# Patient Record
Sex: Male | Born: 1975 | Race: Black or African American | Hispanic: No | Marital: Married | Smoking: Current every day smoker
Health system: Southern US, Community
[De-identification: ages and names within clinical notes are randomized; demographics above are authoritative.]

## PROBLEM LIST (undated history)

## (undated) DIAGNOSIS — I10 Essential (primary) hypertension: Secondary | ICD-10-CM

## (undated) DIAGNOSIS — K635 Polyp of colon: Secondary | ICD-10-CM

## (undated) DIAGNOSIS — K2970 Gastritis, unspecified, without bleeding: Secondary | ICD-10-CM

---

## 2018-11-06 ENCOUNTER — Emergency Department (HOSPITAL_COMMUNITY)
Admission: EM | Admit: 2018-11-06 | Discharge: 2018-11-06 | Disposition: A | Discharge status: Home / Self Care | Payer: Self-pay | Attending: Emergency Medicine | Admitting: Emergency Medicine

## 2018-11-06 ENCOUNTER — Encounter (HOSPITAL_COMMUNITY): Payer: Self-pay | Admitting: *Deleted

## 2018-11-06 DIAGNOSIS — R112 Nausea with vomiting, unspecified: Secondary | ICD-10-CM | POA: Insufficient documentation

## 2018-11-06 DIAGNOSIS — H5712 Ocular pain, left eye: Secondary | ICD-10-CM | POA: Insufficient documentation

## 2018-11-06 DIAGNOSIS — I10 Essential (primary) hypertension: Secondary | ICD-10-CM | POA: Insufficient documentation

## 2018-11-06 DIAGNOSIS — F17210 Nicotine dependence, cigarettes, uncomplicated: Secondary | ICD-10-CM | POA: Insufficient documentation

## 2018-11-06 DIAGNOSIS — R197 Diarrhea, unspecified: Secondary | ICD-10-CM | POA: Insufficient documentation

## 2018-11-06 HISTORY — DX: Essential (primary) hypertension: I10

## 2018-11-06 MED ORDER — TETRACAINE HCL 0.5 % OP SOLN
2.0000 [drp] | Freq: Once | OPHTHALMIC | Status: DC
  Filled 2018-11-06: qty 4

## 2018-11-06 MED ORDER — ONDANSETRON HCL 4 MG PO TABS: 4.0000 mg | ORAL_TABLET | Freq: Three times a day (TID) | ORAL | 0 refills | Status: DC | PRN

## 2018-11-06 NOTE — ED Triage Notes (Signed)
Pt in c/o left eye redness and drainage also n/v yesterday, no distress noted

## 2018-11-06 NOTE — Discharge Instructions (Addendum)
You were evaluated in the emergency department for pain in your left eye with some light sensitivity that is been longstanding.  For that we recommend that you follow-up with 1 of her eye specialist, his number and address are given.  You also were having an episode of nausea and vomiting which seems to be improving.  We are providing you a prescription for some Zofran.  Please get set up with a primary care doctor but return if any worsening symptoms.
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
## 2020-05-24 ENCOUNTER — Emergency Department (HOSPITAL_COMMUNITY)
Admission: EM | Admit: 2020-05-24 | Discharge: 2020-05-24 | Disposition: A | Discharge status: Home / Self Care | Payer: Medicaid Other | Attending: Emergency Medicine | Admitting: Emergency Medicine

## 2020-05-24 ENCOUNTER — Encounter (HOSPITAL_COMMUNITY): Payer: Self-pay | Admitting: Emergency Medicine

## 2020-05-24 ENCOUNTER — Other Ambulatory Visit: Payer: Self-pay

## 2020-05-24 DIAGNOSIS — R112 Nausea with vomiting, unspecified: Secondary | ICD-10-CM | POA: Insufficient documentation

## 2020-05-24 DIAGNOSIS — Z5321 Procedure and treatment not carried out due to patient leaving prior to being seen by health care provider: Secondary | ICD-10-CM | POA: Insufficient documentation

## 2020-05-24 LAB — CBC
HCT: 46.2 % (ref 39.0–52.0)
Hemoglobin: 15.5 g/dL (ref 13.0–17.0)
MCH: 31.5 pg (ref 26.0–34.0)
MCHC: 33.5 g/dL (ref 30.0–36.0)
MCV: 93.9 fL (ref 80.0–100.0)
Platelets: 406 10*3/uL — ABNORMAL HIGH (ref 150–400)
RBC: 4.92 MIL/uL (ref 4.22–5.81)
RDW: 13.3 % (ref 11.5–15.5)
WBC: 11.1 10*3/uL — ABNORMAL HIGH (ref 4.0–10.5)
nRBC: 0 % (ref 0.0–0.2)

## 2020-05-24 LAB — COMPREHENSIVE METABOLIC PANEL
ALT: 15 U/L (ref 0–44)
AST: 16 U/L (ref 15–41)
Albumin: 4.5 g/dL (ref 3.5–5.0)
Alkaline Phosphatase: 55 U/L (ref 38–126)
Anion gap: 13 (ref 5–15)
BUN: 14 mg/dL (ref 6–20)
CO2: 29 mmol/L (ref 22–32)
Calcium: 9.4 mg/dL (ref 8.9–10.3)
Chloride: 98 mmol/L (ref 98–111)
Creatinine, Ser: 1.28 mg/dL — ABNORMAL HIGH (ref 0.61–1.24)
GFR calc Af Amer: >60 mL/min (ref >60)
GFR calc non Af Amer: >60 mL/min (ref >60)
Glucose, Bld: 130 mg/dL — ABNORMAL HIGH (ref 70–99)
Potassium: 3.3 mmol/L — ABNORMAL LOW (ref 3.5–5.1)
Sodium: 140 mmol/L (ref 135–145)
Total Bilirubin: 1.8 mg/dL — ABNORMAL HIGH (ref 0.3–1.2)
Total Protein: 7.9 g/dL (ref 6.5–8.1)

## 2020-05-24 LAB — LIPASE, BLOOD: Lipase: 34 U/L (ref 11–51)

## 2020-05-24 MED ORDER — SODIUM CHLORIDE 0.9% FLUSH: 3.0000 mL | Freq: Once | INTRAVENOUS | Status: DC

## 2020-05-24 NOTE — ED Notes (Addendum)
Pt stated he can not wait any longer and is leaving

## 2020-05-24 NOTE — ED Triage Notes (Signed)
Pt stated, Ive had N/V since yesterday ibuprofen hae drank some Pedialyte which some has stayed down.

## 2020-08-11 ENCOUNTER — Emergency Department (HOSPITAL_COMMUNITY): Payer: Self-pay

## 2020-08-11 ENCOUNTER — Emergency Department (HOSPITAL_COMMUNITY)
Admission: EM | Admit: 2020-08-11 | Discharge: 2020-08-11 | Disposition: A | Discharge status: Home / Self Care | Payer: Self-pay | Attending: Emergency Medicine | Admitting: Emergency Medicine

## 2020-08-11 ENCOUNTER — Encounter (HOSPITAL_COMMUNITY): Payer: Self-pay

## 2020-08-11 DIAGNOSIS — K2900 Acute gastritis without bleeding: Secondary | ICD-10-CM | POA: Insufficient documentation

## 2020-08-11 DIAGNOSIS — F17210 Nicotine dependence, cigarettes, uncomplicated: Secondary | ICD-10-CM | POA: Insufficient documentation

## 2020-08-11 DIAGNOSIS — I10 Essential (primary) hypertension: Secondary | ICD-10-CM | POA: Insufficient documentation

## 2020-08-11 DIAGNOSIS — Z20822 Contact with and (suspected) exposure to covid-19: Secondary | ICD-10-CM | POA: Insufficient documentation

## 2020-08-11 LAB — COMPREHENSIVE METABOLIC PANEL
ALT: 36 U/L (ref 0–44)
AST: 32 U/L (ref 15–41)
Albumin: 4.2 g/dL (ref 3.5–5.0)
Alkaline Phosphatase: 55 U/L (ref 38–126)
Anion gap: 14 (ref 5–15)
BUN: 19 mg/dL (ref 6–20)
CO2: 28 mmol/L (ref 22–32)
Calcium: 9 mg/dL (ref 8.9–10.3)
Chloride: 94 mmol/L — ABNORMAL LOW (ref 98–111)
Creatinine, Ser: 1.23 mg/dL (ref 0.61–1.24)
GFR calc non Af Amer: >60 mL/min (ref >60)
Glucose, Bld: 114 mg/dL — ABNORMAL HIGH (ref 70–99)
Potassium: 3.1 mmol/L — ABNORMAL LOW (ref 3.5–5.1)
Sodium: 136 mmol/L (ref 135–145)
Total Bilirubin: 1.3 mg/dL — ABNORMAL HIGH (ref 0.3–1.2)
Total Protein: 7.5 g/dL (ref 6.5–8.1)

## 2020-08-11 LAB — CBC
HCT: 49.3 % (ref 39.0–52.0)
Hemoglobin: 16.7 g/dL (ref 13.0–17.0)
MCH: 32.1 pg (ref 26.0–34.0)
MCHC: 33.9 g/dL (ref 30.0–36.0)
MCV: 94.8 fL (ref 80.0–100.0)
Platelets: 419 10*3/uL — ABNORMAL HIGH (ref 150–400)
RBC: 5.2 MIL/uL (ref 4.22–5.81)
RDW: 12.6 % (ref 11.5–15.5)
WBC: 11.9 10*3/uL — ABNORMAL HIGH (ref 4.0–10.5)
nRBC: 0 % (ref 0.0–0.2)

## 2020-08-11 LAB — URINALYSIS, ROUTINE W REFLEX MICROSCOPIC
Bacteria, UA: NONE SEEN
Bilirubin Urine: NEGATIVE
Glucose, UA: NEGATIVE mg/dL
Ketones, ur: NEGATIVE mg/dL
Nitrite: NEGATIVE
Protein, ur: NEGATIVE mg/dL
Specific Gravity, Urine: 1.019 (ref 1.005–1.030)
pH: 5 (ref 5.0–8.0)

## 2020-08-11 LAB — RESPIRATORY PANEL BY RT PCR (FLU A&B, COVID)
Influenza A by PCR: NEGATIVE
Influenza B by PCR: NEGATIVE
SARS Coronavirus 2 by RT PCR: NEGATIVE

## 2020-08-11 LAB — LIPASE, BLOOD: Lipase: 30 U/L (ref 11–51)

## 2020-08-11 MED ORDER — SODIUM CHLORIDE 0.9 % IV SOLN
80.0000 mg | Freq: Once | INTRAVENOUS | Status: AC
  Administered 2020-08-11: 80 mg via INTRAVENOUS
  Filled 2020-08-11: qty 80

## 2020-08-11 MED ORDER — ESOMEPRAZOLE MAGNESIUM 40 MG PO CPDR: 40.0000 mg | DELAYED_RELEASE_CAPSULE | Freq: Every day | ORAL | 0 refills | Status: DC

## 2020-08-11 MED ORDER — LIDOCAINE VISCOUS HCL 2 % MT SOLN
15.0000 mL | Freq: Once | OROMUCOSAL | Status: AC
  Administered 2020-08-11: 15 mL via ORAL
  Filled 2020-08-11: qty 15

## 2020-08-11 MED ORDER — ALUM & MAG HYDROXIDE-SIMETH 200-200-20 MG/5ML PO SUSP
30.0000 mL | Freq: Once | ORAL | Status: AC
  Administered 2020-08-11: 30 mL via ORAL
  Filled 2020-08-11: qty 30

## 2020-08-11 MED ORDER — POTASSIUM CHLORIDE CRYS ER 20 MEQ PO TBCR
40.0000 meq | EXTENDED_RELEASE_TABLET | Freq: Once | ORAL | Status: AC
  Administered 2020-08-11: 40 meq via ORAL
  Filled 2020-08-11: qty 2

## 2020-08-11 MED ORDER — LACTATED RINGERS IV BOLUS: 1000.0000 mL | Freq: Once | INTRAVENOUS | Status: DC

## 2020-08-11 MED ORDER — SODIUM CHLORIDE 0.9 % IV BOLUS
1000.0000 mL | Freq: Once | INTRAVENOUS | Status: AC
  Administered 2020-08-11: 1000 mL via INTRAVENOUS

## 2020-08-11 NOTE — Discharge Instructions (Signed)
This is likely another flare of your gastritis.  Please start taking Nexium once daily.  This will help your stomach.  Call the number included in your paperwork to schedule an appointment with a primary care doctor.  They will be able to determine next steps.

## 2020-08-11 NOTE — ED Triage Notes (Signed)
Pt reports nv/d and left sided flank pain for the past week. Denies blood in urine or stool

## 2020-08-11 NOTE — ED Notes (Signed)
Pt st's he has had gastritis in past and symptoms feel the same

## 2020-08-11 NOTE — ED Provider Notes (Signed)
[facility] EMERGENCY DEPARTMENT Provider Note   CSN: [id number] Arrival date & time: 08/11/20  1455     History Chief Complaint  Patient presents with  . Emesis  . Diarrhea  . Flank Pain    Victor Johnson is a 44 y.o. male with history of gastritis who presents with a week and a half of nausea and vomiting.  Endorses continued fatigue and lightheadedness upon standing, as well as some muscle cramps in his lower legs. Kept some soup down this morning, but previously not tolerating p.o. well for the majority of the last week and a half.  States his symptoms feel very similar to prior bouts of gastritis, which can be triggered by eating highly acidic foods. He does drink multiple alcoholic beverages a day, but he denies having increased his drinking recently.  He used to be on probiotics and another medication for his gastritis, but has not been taking anything for a while.   Emesis Severity:  Moderate Duration:  10 days Timing:  Intermittent Quality:  Stomach contents Progression:  Improving Chronicity:  Recurrent Recent urination:  Decreased Relieved by:  Nothing Associated symptoms: diarrhea and myalgias   Associated symptoms: no abdominal pain, no arthralgias, no chills, no cough, no fever, no headaches and no sore throat        Past Medical History:  Diagnosis Date  . Hypertension     There are no problems to display for this patient.   History reviewed. No pertinent surgical history.     No family history on file.  Social History   Tobacco Use  . Smoking status: Current Every Day Smoker  . Smokeless tobacco: Never Used  Substance Use Topics  . Alcohol use: Yes  . Drug use: Yes    Types: Marijuana    Home Medications Prior to Admission medications   Medication Sig Start Date End Date Taking? Authorizing Provider  esomeprazole (NEXIUM) 40 MG capsule Take 1 capsule (40 mg total) by mouth daily. 08/11/20   Allayne Butcher, MD  ondansetron  (ZOFRAN) 4 MG tablet Take 1 tablet (4 mg total) by mouth every 8 (eight) hours as needed for nausea or vomiting. Patient not taking: Reported on 08/11/2020 11/06/18   Terrilee Files, MD    Allergies    Patient has no known allergies.  Review of Systems   Review of Systems  Constitutional: Negative for chills and fever.  HENT: Negative for ear pain and sore throat.   Eyes: Negative for pain and visual disturbance.  Respiratory: Negative for cough and shortness of breath.   Cardiovascular: Negative for chest pain and palpitations.  Gastrointestinal: Positive for diarrhea, nausea and vomiting. Negative for abdominal pain.  Genitourinary: Positive for decreased urine volume. Negative for dysuria and hematuria.  Musculoskeletal: Positive for myalgias. Negative for arthralgias and back pain.  Skin: Negative for color change and rash.  Neurological: Negative for seizures, syncope and headaches.  All other systems reviewed and are negative.   Physical Exam Updated Vital Signs BP (!) 133/100 (BP Location: Right Arm)   Pulse 88   Temp 98.2 F (36.8 C) (Oral)   Resp 18   Ht 5\' 8"  (1.727 m)   Wt 63.5 kg   SpO2 100%   BMI 21.29 kg/m   Physical Exam Vitals and nursing note reviewed.  Constitutional:      Appearance: He is well-developed.  HENT:     Head: Normocephalic and atraumatic.     Mouth/Throat:  Mouth: Mucous membranes are dry.  Eyes:     Conjunctiva/sclera: Conjunctivae normal.  Cardiovascular:     Rate and Rhythm: Normal rate and regular rhythm.     Heart sounds: No murmur heard.   Pulmonary:     Effort: Pulmonary effort is normal. No respiratory distress.     Breath sounds: Normal breath sounds.  Abdominal:     Palpations: Abdomen is soft.     Tenderness: There is abdominal tenderness (mild left flank tenderness).  Musculoskeletal:     Cervical back: Neck supple.  Skin:    General: Skin is warm and dry.  Neurological:     Mental Status: He is alert.      ED Results / Procedures / Treatments   Labs (all labs ordered are listed, but only abnormal results are displayed) Labs Reviewed  COMPREHENSIVE METABOLIC PANEL - Abnormal; Notable for the following components:      Result Value   Potassium 3.1 (*)    Chloride 94 (*)    Glucose, Bld 114 (*)    Total Bilirubin 1.3 (*)    All other components within normal limits  CBC - Abnormal; Notable for the following components:   WBC 11.9 (*)    Platelets 419 (*)    All other components within normal limits  URINALYSIS, ROUTINE W REFLEX MICROSCOPIC - Abnormal; Notable for the following components:   APPearance HAZY (*)    Hgb urine dipstick SMALL (*)    Leukocytes,Ua MODERATE (*)    All other components within normal limits  RESPIRATORY PANEL BY RT PCR (FLU A&B, COVID)  LIPASE, BLOOD    EKG None  Radiology DG Chest Port 1 View  Result Date: 08/11/2020 CLINICAL DATA:  Vomiting EXAM: PORTABLE CHEST 1 VIEW COMPARISON:  None. FINDINGS: The heart size and mediastinal contours are within normal limits. Both lungs are clear. The visualized skeletal structures are unremarkable. IMPRESSION: No active disease. Electronically Signed   By: Helyn Numbers MD   On: 08/11/2020 16:18    Procedures Procedures (including critical care time)  Medications Ordered in ED Medications  sodium chloride 0.9 % bolus 1,000 mL (0 mLs Intravenous Stopped 08/11/20 1742)  pantoprazole (PROTONIX) 80 mg in sodium chloride 0.9 % 100 mL IVPB (0 mg Intravenous Stopped 08/11/20 1658)  alum & mag hydroxide-simeth (MAALOX/MYLANTA) 200-200-20 MG/5ML suspension 30 mL (30 mLs Oral Given 08/11/20 1636)    And  lidocaine (XYLOCAINE) 2 % viscous mouth solution 15 mL (15 mLs Oral Given 08/11/20 1636)  potassium chloride SA (KLOR-CON) CR tablet 40 mEq (40 mEq Oral Given 08/11/20 1635)    ED Course  I have reviewed the triage vital signs and the nursing notes.  Pertinent labs & imaging results that were available during my  care of the patient were reviewed by me and considered in my medical decision making (see chart for details).    MDM Rules/Calculators/A&P                          WBC 11.9. Potassium 3.1, will replete. Covid negative. Labs otherwise unremarkable. Not consistent with pancreatitis, appendicitis, UTI.   Consistent with gastritis versus viral gastro. Patient given fluid bolus, GI cocktail, Protonix with improvement in symptoms. He does not have insurance that he has not been taking medications or seeing a PCP. Patient given resources for follow-up in addition to a new prescription for nexium. He is stable for discharge at this time.  This patient was seen  with Dr. Silverio Lay. Final Clinical Impression(s) / ED Diagnoses Final diagnoses:  Acute gastritis without hemorrhage, unspecified gastritis type    Rx / DC Orders ED Discharge Orders         Ordered    esomeprazole (NEXIUM) 40 MG capsule  Daily        08/11/20 1744           Allayne Butcher, MD 08/11/20 2314    Charlynne Pander, MD 08/12/20 [phone]

## 2020-08-11 NOTE — ED Notes (Signed)
Case worker Burna Mortimer) in to speak with pt about primary care    Information given to pt

## 2020-08-23 ENCOUNTER — Other Ambulatory Visit: Payer: Self-pay

## 2020-08-23 ENCOUNTER — Encounter (INDEPENDENT_AMBULATORY_CARE_PROVIDER_SITE_OTHER): Payer: Self-pay | Admitting: Primary Care

## 2020-08-23 ENCOUNTER — Ambulatory Visit (INDEPENDENT_AMBULATORY_CARE_PROVIDER_SITE_OTHER): Payer: Self-pay | Admitting: Primary Care

## 2020-08-23 VITALS — BP 143/89 | HR 79 | Temp 97.5°F | Ht 68.0 in | Wt 146.0 lb

## 2020-08-23 DIAGNOSIS — Z7689 Persons encountering health services in other specified circumstances: Secondary | ICD-10-CM

## 2020-08-23 DIAGNOSIS — I10 Essential (primary) hypertension: Secondary | ICD-10-CM

## 2020-08-23 DIAGNOSIS — E876 Hypokalemia: Secondary | ICD-10-CM

## 2020-08-23 MED ORDER — AMLODIPINE BESYLATE 5 MG PO TABS: 5.0000 mg | ORAL_TABLET | Freq: Every day | ORAL | 1 refills | Status: DC

## 2020-08-23 NOTE — Patient Instructions (Signed)

## 2020-08-23 NOTE — Progress Notes (Signed)
°  HPI [name] is a  44 y.o.male presents for follow up from the emergency room presented on  08/11/20, with nausea vomiting and diarrhea.  Symptoms lasted a week or so. He is also, establishing care.  Past Medical History:  Diagnosis Date   Hypertension      No Known Allergies    Current Outpatient Medications on File Prior to Visit  Medication Sig Dispense Refill   esomeprazole (NEXIUM) 40 MG capsule Take 1 capsule (40 mg total) by mouth daily. 30 capsule 0   No current facility-administered medications on file prior to visit.    ROS: all negative except above.   Physical Exam: Filed Weights   08/23/20 1552  Weight: 146 lb (66.2 kg)   BP (!) 151/108 (BP Location: Right Arm, Patient Position: Sitting, Cuff Size: Normal)    Pulse 91    Temp (!) 97.5 F (36.4 C) (Temporal)    Ht 5\' 8"  (1.727 m)    Wt 146 lb (66.2 kg)    SpO2 97%    BMI 22.20 kg/m  General Appearance: Well nourished,male who is in no apparent distress. Eyes: PERRLA, EOMs, conjunctiva no swelling or erythema Sinuses: No Frontal/maxillary tenderness ENT/Mouth: Ext aud canals clear, TMs without erythema, bulging. Hearing normal.  Neck: Supple, thyroid normal.  Respiratory: Respiratory effort normal, BS equal bilaterally without rales, rhonchi, wheezing or stridor.  Cardio: RRR with no MRGs. Brisk peripheral pulses without edema.  Abdomen: Soft, + BS.  Non tender, no guarding, rebound, hernias, masses. Lymphatics: Non tender without lymphadenopathy.  Musculoskeletal: Full ROM, 5/5 strength, normal gait.  Skin: Warm, dry without rashes, lesions, ecchymosis.  Neuro: Cranial nerves intact. Normal muscle tone, no cerebellar symptoms. Sensation intact.  Psych: Awake and oriented X 3, normal affect, Insight and Judgment appropriate.    Victor Johnson was seen today for hospitalization follow-up.  Diagnoses and all orders for this visit:  Encounter to establish care Aneta Mins, NP-C will be your  (PCP) she  is mastered prepared . Able to diagnosed and treatment also  answer health concern as well as continuing care of varied medical conditions, not limited by cause, organ system, or diagnosis.   Hypokalemia Replaced in ED will follow up for Bp ck and potassium   Essential hypertension Counseled on blood pressure goal of less than 130/80, low-sodium, DASH diet, medication compliance, 150 minutes of moderate intensity exercise per week. . -     amLODipine (NORVASC) 5 MG tablet; Take 1 tablet (5 mg total) by mouth daily.  [name]

## 2020-10-04 ENCOUNTER — Ambulatory Visit (INDEPENDENT_AMBULATORY_CARE_PROVIDER_SITE_OTHER): Payer: Medicaid Other | Admitting: Primary Care

## 2020-10-13 ENCOUNTER — Ambulatory Visit (INDEPENDENT_AMBULATORY_CARE_PROVIDER_SITE_OTHER): Payer: BC Managed Care – PPO | Admitting: Primary Care

## 2020-10-13 ENCOUNTER — Encounter (INDEPENDENT_AMBULATORY_CARE_PROVIDER_SITE_OTHER): Payer: Self-pay | Admitting: Primary Care

## 2020-10-13 ENCOUNTER — Other Ambulatory Visit: Payer: Self-pay

## 2020-10-13 VITALS — BP 158/115 | HR 98 | Temp 98.1°F | Ht 68.0 in | Wt 142.6 lb

## 2020-10-13 DIAGNOSIS — Z0130 Encounter for examination of blood pressure without abnormal findings: Secondary | ICD-10-CM | POA: Diagnosis not present

## 2020-10-13 MED ORDER — HYDROCHLOROTHIAZIDE 25 MG PO TABS: 25.0000 mg | ORAL_TABLET | Freq: Every day | ORAL | 3 refills | Status: DC

## 2020-10-13 MED ORDER — AMLODIPINE BESYLATE 10 MG PO TABS: 10.0000 mg | ORAL_TABLET | Freq: Every day | ORAL | 3 refills | Status: DC

## 2020-10-13 NOTE — Progress Notes (Signed)
[facility]    [name] is  a 44 year male who presents for hypertension evaluation, on amlodipine 5 mg previous visit medication was not  adjusted . Denies shortness of breath, headaches, chest pain or lower extremity edema. Prior to appointment has a disagreement causing increase stress. Patient reports adherence with medications.  Current Medication List Current Outpatient Medications on File Prior to Visit  Medication Sig Dispense Refill   amLODipine (NORVASC) 5 MG tablet Take 1 tablet (5 mg total) by mouth daily. 30 tablet 1   esomeprazole (NEXIUM) 40 MG capsule Take 1 capsule (40 mg total) by mouth daily. (Patient not taking: Reported on 10/13/2020) 30 capsule 0   No current facility-administered medications on file prior to visit.   Past Medical History  Past Medical History:  Diagnosis Date   Hypertension    Dietary habits include: discussed eats everything and likes food well seasoned Stop salt, use Ms Sharilyn Sites, onion/garlic powder and herbs Exercise habits include no Family / Social history: Unknown  ASCVD risk factors include- [name]  O:  Physical Exam Vitals reviewed.  Constitutional:      Appearance: He is normal weight.  HENT:     Head: Normocephalic.     Nose: Nose normal.  Cardiovascular:     Rate and Rhythm: Normal rate and regular rhythm.  Pulmonary:     Effort: Pulmonary effort is normal.     Breath sounds: Normal breath sounds.  Abdominal:     General: Bowel sounds are normal.     Palpations: Abdomen is soft.  Musculoskeletal:        General: Normal range of motion.     Cervical back: Normal range of motion.  Skin:    General: Skin is warm and dry.  Neurological:     Mental Status: He is alert and oriented to person, place, and time.  Psychiatric:        Mood and Affect: Mood normal.        Behavior: Behavior normal.        Thought Content: Thought content normal.      Review of Systems  All other systems reviewed and  are negative.   Last 3 Office BP readings: BP Readings from Last 3 Encounters:  10/13/20 (!) 158/115  08/23/20 (!) 143/89  08/11/20 (!) 133/100    BMET    Component Value Date/Time   NA 136 08/11/2020 1524   K 3.1 (L) 08/11/2020 1524   CL 94 (L) 08/11/2020 1524   CO2 28 08/11/2020 1524   GLUCOSE 114 (H) 08/11/2020 1524   BUN 19 08/11/2020 1524   CREATININE 1.23 08/11/2020 1524   CALCIUM 9.0 08/11/2020 1524   GFRNONAA >60 08/11/2020 1524   GFRAA >60 05/24/2020 1029    Renal function: CrCl cannot be calculated (Patient's most recent lab result is older than the maximum 21 days allowed.).  Clinical ASCVD: No  The ASCVD Risk score Denman George DC Jr., et al., 2013) failed to calculate for the following reasons:   Cannot find a previous HDL lab   Cannot find a previous total cholesterol lab   A/P: Victor Johnson was seen today for blood pressure check.  Diagnoses and all orders for this visit:  Blood pressure check Hypertension longstanding currently amlodipine 10mg  daily on current medications. BP Goal = 130/80  mmHg. Patient is adherent with current medications.  -Adjusted dose of  Increased Amlodipine from 5mg  to 10mg   and  Added HCTZ 25mg  daily. -Counseled on lifestyle modifications  for blood pressure control including reduced dietary sodium, increased exercise, adequate sleep  Grayce Sessions

## 2020-10-13 NOTE — Patient Instructions (Signed)
DASH Eating Plan DASH stands for "Dietary Approaches to Stop Hypertension." The DASH eating plan is a healthy eating plan that has been shown to reduce high blood pressure (hypertension). It may also reduce your risk for type 2 diabetes, heart disease, and stroke. The DASH eating plan may also help with weight loss. What are tips for following this plan?  General guidelines  Avoid eating more than 2,300 mg (milligrams) of salt (sodium) a day. If you have hypertension, you may need to reduce your sodium intake to 1,500 mg a day.  Limit alcohol intake to no more than 1 drink a day for nonpregnant women and 2 drinks a day for men. One drink equals 12 oz of beer, 5 oz of wine, or 1 oz of hard liquor.  Work with your health care provider to maintain a healthy body weight or to lose weight. Ask what an ideal weight is for you.  Get at least 30 minutes of exercise that causes your heart to beat faster (aerobic exercise) most days of the week. Activities may include walking, swimming, or biking.  Work with your health care provider or diet and nutrition specialist (dietitian) to adjust your eating plan to your individual calorie needs. Reading food labels   Check food labels for the amount of sodium per serving. Choose foods with less than 5 percent of the Daily Value of sodium. Generally, foods with less than 300 mg of sodium per serving fit into this eating plan.  To find whole grains, look for the word "whole" as the first word in the ingredient list. Shopping  Buy products labeled as "low-sodium" or "no salt added."  Buy fresh foods. Avoid canned foods and premade or frozen meals. Cooking  Avoid adding salt when cooking. Use salt-free seasonings or herbs instead of table salt or sea salt. Check with your health care provider or pharmacist before using salt substitutes.  Do not fry foods. Cook foods using healthy methods such as baking, boiling, grilling, and broiling instead.  Cook with  heart-healthy oils, such as olive, canola, soybean, or sunflower oil. Meal planning  Eat a balanced diet that includes: ? 5 or more servings of fruits and vegetables each day. At each meal, try to fill half of your plate with fruits and vegetables. ? Up to 6-8 servings of whole grains each day. ? Less than 6 oz of lean meat, poultry, or fish each day. A 3-oz serving of meat is about the same size as a deck of cards. One egg equals 1 oz. ? 2 servings of low-fat dairy each day. ? A serving of nuts, seeds, or beans 5 times each week. ? Heart-healthy fats. Healthy fats called Omega-3 fatty acids are found in foods such as flaxseeds and coldwater fish, like sardines, salmon, and mackerel.  Limit how much you eat of the following: ? Canned or prepackaged foods. ? Food that is high in trans fat, such as fried foods. ? Food that is high in saturated fat, such as fatty meat. ? Sweets, desserts, sugary drinks, and other foods with added sugar. ? Full-fat dairy products.  Do not salt foods before eating.  Try to eat at least 2 vegetarian meals each week.  Eat more home-cooked food and less restaurant, buffet, and fast food.  When eating at a restaurant, ask that your food be prepared with less salt or no salt, if possible. What foods are recommended? The items listed may not be a complete list. Talk with your dietitian about   what dietary choices are best for you. Grains Whole-grain or whole-wheat bread. Whole-grain or whole-wheat pasta. Brown rice. Oatmeal. Quinoa. Bulgur. Whole-grain and low-sodium cereals. Pita bread. Low-fat, low-sodium crackers. Whole-wheat flour tortillas. Vegetables Fresh or frozen vegetables (raw, steamed, roasted, or grilled). Low-sodium or reduced-sodium tomato and vegetable juice. Low-sodium or reduced-sodium tomato sauce and tomato paste. Low-sodium or reduced-sodium canned vegetables. Fruits All fresh, dried, or frozen fruit. Canned fruit in natural juice (without  added sugar). Meat and other protein foods Skinless chicken or turkey. Ground chicken or turkey. Pork with fat trimmed off. Fish and seafood. Egg whites. Dried beans, peas, or lentils. Unsalted nuts, nut butters, and seeds. Unsalted canned beans. Lean cuts of beef with fat trimmed off. Low-sodium, lean deli meat. Dairy Low-fat (1%) or fat-free (skim) milk. Fat-free, low-fat, or reduced-fat cheeses. Nonfat, low-sodium ricotta or cottage cheese. Low-fat or nonfat yogurt. Low-fat, low-sodium cheese. Fats and oils Soft margarine without trans fats. Vegetable oil. Low-fat, reduced-fat, or light mayonnaise and salad dressings (reduced-sodium). Canola, safflower, olive, soybean, and sunflower oils. Avocado. Seasoning and other foods Herbs. Spices. Seasoning mixes without salt. Unsalted popcorn and pretzels. Fat-free sweets. What foods are not recommended? The items listed may not be a complete list. Talk with your dietitian about what dietary choices are best for you. Grains Baked goods made with fat, such as croissants, muffins, or some breads. Dry pasta or rice meal packs. Vegetables Creamed or fried vegetables. Vegetables in a cheese sauce. Regular canned vegetables (not low-sodium or reduced-sodium). Regular canned tomato sauce and paste (not low-sodium or reduced-sodium). Regular tomato and vegetable juice (not low-sodium or reduced-sodium). Pickles. Olives. Fruits Canned fruit in a light or heavy syrup. Fried fruit. Fruit in cream or butter sauce. Meat and other protein foods Fatty cuts of meat. Ribs. Fried meat. Bacon. Sausage. Bologna and other processed lunch meats. Salami. Fatback. Hotdogs. Bratwurst. Salted nuts and seeds. Canned beans with added salt. Canned or smoked fish. Whole eggs or egg yolks. Chicken or turkey with skin. Dairy Whole or 2% milk, cream, and half-and-half. Whole or full-fat cream cheese. Whole-fat or sweetened yogurt. Full-fat cheese. Nondairy creamers. Whipped toppings.  Processed cheese and cheese spreads. Fats and oils Butter. Stick margarine. Lard. Shortening. Ghee. Bacon fat. Tropical oils, such as coconut, palm kernel, or palm oil. Seasoning and other foods Salted popcorn and pretzels. Onion salt, garlic salt, seasoned salt, table salt, and sea salt. Worcestershire sauce. Tartar sauce. Barbecue sauce. Teriyaki sauce. Soy sauce, including reduced-sodium. Steak sauce. Canned and packaged gravies. Fish sauce. Oyster sauce. Cocktail sauce. Horseradish that you find on the shelf. Ketchup. Mustard. Meat flavorings and tenderizers. Bouillon cubes. Hot sauce and Tabasco sauce. Premade or packaged marinades. Premade or packaged taco seasonings. Relishes. Regular salad dressings. Where to find more information:  National Heart, Lung, and Blood Institute: www.nhlbi.nih.gov  American Heart Association: www.heart.org Summary  The DASH eating plan is a healthy eating plan that has been shown to reduce high blood pressure (hypertension). It may also reduce your risk for type 2 diabetes, heart disease, and stroke.  With the DASH eating plan, you should limit salt (sodium) intake to 2,300 mg a day. If you have hypertension, you may need to reduce your sodium intake to 1,500 mg a day.  When on the DASH eating plan, aim to eat more fresh fruits and vegetables, whole grains, lean proteins, low-fat dairy, and heart-healthy fats.  Work with your health care provider or diet and nutrition specialist (dietitian) to adjust your eating plan to your   individual calorie needs. This information is not intended to replace advice given to you by your health care provider. Make sure you discuss any questions you have with your health care provider. Document Revised: 10/04/2017 Document Reviewed: 10/15/2016 Elsevier Patient Education  2020 Elsevier Inc.  

## 2020-11-17 ENCOUNTER — Ambulatory Visit (INDEPENDENT_AMBULATORY_CARE_PROVIDER_SITE_OTHER): Payer: BC Managed Care – PPO | Admitting: Primary Care

## 2020-12-06 ENCOUNTER — Other Ambulatory Visit: Payer: Self-pay

## 2020-12-06 ENCOUNTER — Encounter (INDEPENDENT_AMBULATORY_CARE_PROVIDER_SITE_OTHER): Payer: Self-pay | Admitting: Primary Care

## 2020-12-06 ENCOUNTER — Ambulatory Visit (INDEPENDENT_AMBULATORY_CARE_PROVIDER_SITE_OTHER): Payer: BC Managed Care – PPO | Admitting: Primary Care

## 2020-12-06 ENCOUNTER — Ambulatory Visit (INDEPENDENT_AMBULATORY_CARE_PROVIDER_SITE_OTHER): Payer: Self-pay | Admitting: Primary Care

## 2020-12-06 VITALS — BP 127/82 | HR 93 | Temp 97.3°F | Ht 68.0 in | Wt 140.8 lb

## 2020-12-06 DIAGNOSIS — I10 Essential (primary) hypertension: Secondary | ICD-10-CM

## 2020-12-06 NOTE — Progress Notes (Signed)
[facility]   Victor Johnson is a 45 year old male who presents for hypertension evaluation, on previous visit medication was adjusted to include amlodipine 10mg  with HCTZ 25mg . Denies shortness of breath, headaches, chest pain or lower extremity edema, sudden onset, vision changes, unilateral weakness, dizziness, paresthesias  Patient reports adherence with medications.  Current Medication List Current Outpatient Medications on File Prior to Visit  Medication Sig Dispense Refill  . amLODipine (NORVASC) 10 MG tablet Take 1 tablet (10 mg total) by mouth daily. 90 tablet 3  . esomeprazole (NEXIUM) 40 MG capsule Take 1 capsule (40 mg total) by mouth daily. 30 capsule 0  . hydrochlorothiazide (HYDRODIURIL) 25 MG tablet Take 1 tablet (25 mg total) by mouth daily. 90 tablet 3   No current facility-administered medications on file prior to visit.   Past Medical History  Past Medical History:  Diagnosis Date  . Hypertension     Dietary habits include: watching sodium intake  Exercise habits include:walking  Family / Social history: No  ASCVD risk factors include-  O:  Physical Exam Vitals reviewed.  Constitutional:      Appearance: Normal appearance.  HENT:     Right Ear: External ear normal.     Left Ear: External ear normal.     Nose: Nose normal.  Eyes:     Extraocular Movements: Extraocular movements intact.  Cardiovascular:     Rate and Rhythm: Normal rate and regular rhythm.  Pulmonary:     Effort: Pulmonary effort is normal.  Abdominal:     General: Bowel sounds are normal.     Palpations: Abdomen is soft.  Musculoskeletal:        General: Normal range of motion.     Cervical back: Normal range of motion.  Skin:    General: Skin is warm and dry.  Neurological:     Mental Status: He is alert and oriented to person, place, and time.  Psychiatric:        Mood and Affect: Mood normal.        Behavior: Behavior normal.        Thought  Content: Thought content normal.        Judgment: Judgment normal.      Review of Systems  All other systems reviewed and are negative.   Last 3 Office BP readings: BP Readings from Last 3 Encounters:  12/06/20 127/82  10/13/20 (!) 158/115  08/23/20 (!) 143/89    BMET    Component Value Date/Time   NA 136 08/11/2020 1524   K 3.1 (L) 08/11/2020 1524   CL 94 (L) 08/11/2020 1524   CO2 28 08/11/2020 1524   GLUCOSE 114 (H) 08/11/2020 1524   BUN 19 08/11/2020 1524   CREATININE 1.23 08/11/2020 1524   CALCIUM 9.0 08/11/2020 1524   GFRNONAA >60 08/11/2020 1524   GFRAA >60 05/24/2020 1029    Renal function: CrCl cannot be calculated (Patient's most recent lab result is older than the maximum 21 days allowed.).  Clinical ASCVD: No  The ASCVD Risk score 10/11/2020 DC Jr., et al., 2013) failed to calculate for the following reasons:   Cannot find a previous HDL lab   Cannot find a previous total cholesterol lab   A/P: Hypertension longstanding diagnosed currently  on current medications. BP Goal = 130/80 mmHg. Patient is adherent with current medications.  -Continued  -F/u labs ordered - none -Counseled on lifestyle modifications for blood pressure control including reduced dietary sodium, increased exercise,  adequate sleep  Grayce Sessions

## 2020-12-06 NOTE — Patient Instructions (Signed)
Managing Your Hypertension Hypertension, also called high blood pressure, is when the force of the blood pressing against the walls of the arteries is too strong. Arteries are blood vessels that carry blood from your heart throughout your body. Hypertension forces the heart to work harder to pump blood and may cause the arteries to become narrow or stiff. Understanding blood pressure readings Your personal target blood pressure may vary depending on your medical conditions, your age, and other factors. A blood pressure reading includes a higher number over a lower number. Ideally, your blood pressure should be below 120/80. You should know that:  The first, or top, number is called the systolic pressure. It is a measure of the pressure in your arteries as your heart beats.  The second, or bottom number, is called the diastolic pressure. It is a measure of the pressure in your arteries as the heart relaxes. Blood pressure is classified into four stages. Based on your blood pressure reading, your health care provider may use the following stages to determine what type of treatment you need, if any. Systolic pressure and diastolic pressure are measured in a unit called mmHg. Normal  Systolic pressure: below 120.  Diastolic pressure: below 80. Elevated  Systolic pressure: 120-129.  Diastolic pressure: below 80. Hypertension stage 1  Systolic pressure: 130-139.  Diastolic pressure: 80-89. Hypertension stage 2  Systolic pressure: 140 or above.  Diastolic pressure: 90 or above. How can this condition affect me? Managing your hypertension is an important responsibility. Over time, hypertension can damage the arteries and decrease blood flow to important parts of the body, including the brain, heart, and kidneys. Having untreated or uncontrolled hypertension can lead to:  A heart attack.  A stroke.  A weakened blood vessel (aneurysm).  Heart failure.  Kidney damage.  Eye  damage.  Metabolic syndrome.  Memory and concentration problems.  Vascular dementia. What actions can I take to manage this condition? Hypertension can be managed by making lifestyle changes and possibly by taking medicines. Your health care provider will help you make a plan to bring your blood pressure within a normal range. Nutrition  Eat a diet that is high in fiber and potassium, and low in salt (sodium), added sugar, and fat. An example eating plan is called the Dietary Approaches to Stop Hypertension (DASH) diet. To eat this way: ? Eat plenty of fresh fruits and vegetables. Try to fill one-half of your plate at each meal with fruits and vegetables. ? Eat whole grains, such as whole-wheat pasta, brown rice, or whole-grain bread. Fill about one-fourth of your plate with whole grains. ? Eat low-fat dairy products. ? Avoid fatty cuts of meat, processed or cured meats, and poultry with skin. Fill about one-fourth of your plate with lean proteins such as fish, chicken without skin, beans, eggs, and tofu. ? Avoid pre-made and processed foods. These tend to be higher in sodium, added sugar, and fat.  Reduce your daily sodium intake. Most people with hypertension should eat less than 1,500 mg of sodium a day.   Lifestyle  Work with your health care provider to maintain a healthy body weight or to lose weight. Ask what an ideal weight is for you.  Get at least 30 minutes of exercise that causes your heart to beat faster (aerobic exercise) most days of the week. Activities may include walking, swimming, or biking.  Include exercise to strengthen your muscles (resistance exercise), such as weight lifting, as part of your weekly exercise routine. Try   to do these types of exercises for 30 minutes at least 3 days a week.  Do not use any products that contain nicotine or tobacco, such as cigarettes, e-cigarettes, and chewing tobacco. If you need help quitting, ask your health care  provider.  Control any long-term (chronic) conditions you have, such as high cholesterol or diabetes.  Identify your sources of stress and find ways to manage stress. This may include meditation, deep breathing, or making time for fun activities.   Alcohol use  Do not drink alcohol if: ? Your health care provider tells you not to drink. ? You are pregnant, may be pregnant, or are planning to become pregnant.  If you drink alcohol: ? Limit how much you use to:  0-1 drink a day for women.  0-2 drinks a day for men. ? Be aware of how much alcohol is in your drink. In the [place] one drink equals one 12 oz bottle of beer (355 mL), one 5 oz glass of wine (148 mL), or one 1 oz glass of hard liquor (44 mL). Medicines Your health care provider may prescribe medicine if lifestyle changes are not enough to get your blood pressure under control and if:  Your systolic blood pressure is 130 or higher.  Your diastolic blood pressure is 80 or higher. Take medicines only as told by your health care provider. Follow the directions carefully. Blood pressure medicines must be taken as told by your health care provider. The medicine does not work as well when you skip doses. Skipping doses also puts you at risk for problems. Monitoring Before you monitor your blood pressure:  Do not smoke, drink caffeinated beverages, or exercise within 30 minutes before taking a measurement.  Use the bathroom and empty your bladder (urinate).  Sit quietly for at least 5 minutes before taking measurements. Monitor your blood pressure at home as told by your health care provider. To do this:  Sit with your back straight and supported.  Place your feet flat on the floor. Do not cross your legs.  Support your arm on a flat surface, such as a table. Make sure your upper arm is at heart level.  Each time you measure, take two or three readings one minute apart and record the results. You may also need to have your  blood pressure checked regularly by your health care provider.   General information  Talk with your health care provider about your diet, exercise habits, and other lifestyle factors that may be contributing to hypertension.  Review all the medicines you take with your health care provider because there may be side effects or interactions.  Keep all visits as told by your health care provider. Your health care provider can help you create and adjust your plan for managing your high blood pressure. Where to find more information  National Heart, Lung, and Blood Institute: www.nhlbi.nih.gov  American Heart Association: www.heart.org Contact a health care provider if:  You think you are having a reaction to medicines you have taken.  You have repeated (recurrent) headaches.  You feel dizzy.  You have swelling in your ankles.  You have trouble with your vision. Get help right away if:  You develop a severe headache or confusion.  You have unusual weakness or numbness, or you feel faint.  You have severe pain in your chest or abdomen.  You vomit repeatedly.  You have trouble breathing. These symptoms may represent a serious problem that is an emergency. Do not wait   to see if the symptoms will go away. Get medical help right away. Call your local emergency services (911 in the [place] Do not drive yourself to the hospital. Summary  Hypertension is when the force of blood pumping through your arteries is too strong. If this condition is not controlled, it may put you at risk for serious complications.  Your personal target blood pressure may vary depending on your medical conditions, your age, and other factors. For most people, a normal blood pressure is less than 120/80.  Hypertension is managed by lifestyle changes, medicines, or both.  Lifestyle changes to help manage hypertension include losing weight, eating a healthy, low-sodium diet, exercising more, stopping smoking, and  limiting alcohol. This information is not intended to replace advice given to you by your health care provider. Make sure you discuss any questions you have with your health care provider. Document Revised: 11/27/2019 Document Reviewed: 09/22/2019 Elsevier Patient Education  2021 Elsevier Inc.  

## 2021-05-12 ENCOUNTER — Other Ambulatory Visit: Payer: Self-pay

## 2021-05-12 ENCOUNTER — Encounter (HOSPITAL_COMMUNITY): Payer: Self-pay | Admitting: Emergency Medicine

## 2021-05-12 ENCOUNTER — Emergency Department (HOSPITAL_COMMUNITY)
Admission: EM | Admit: 2021-05-12 | Discharge: 2021-05-13 | Disposition: A | Discharge status: Home / Self Care | Payer: Medicaid Other | Attending: Emergency Medicine | Admitting: Emergency Medicine

## 2021-05-12 DIAGNOSIS — E876 Hypokalemia: Secondary | ICD-10-CM | POA: Insufficient documentation

## 2021-05-12 DIAGNOSIS — R1114 Bilious vomiting: Secondary | ICD-10-CM

## 2021-05-12 DIAGNOSIS — F17200 Nicotine dependence, unspecified, uncomplicated: Secondary | ICD-10-CM | POA: Insufficient documentation

## 2021-05-12 DIAGNOSIS — Z0130 Encounter for examination of blood pressure without abnormal findings: Secondary | ICD-10-CM

## 2021-05-12 DIAGNOSIS — Z79899 Other long term (current) drug therapy: Secondary | ICD-10-CM | POA: Insufficient documentation

## 2021-05-12 DIAGNOSIS — Z20822 Contact with and (suspected) exposure to covid-19: Secondary | ICD-10-CM | POA: Insufficient documentation

## 2021-05-12 DIAGNOSIS — R1013 Epigastric pain: Secondary | ICD-10-CM | POA: Insufficient documentation

## 2021-05-12 DIAGNOSIS — I10 Essential (primary) hypertension: Secondary | ICD-10-CM | POA: Insufficient documentation

## 2021-05-12 DIAGNOSIS — R112 Nausea with vomiting, unspecified: Secondary | ICD-10-CM | POA: Insufficient documentation

## 2021-05-12 LAB — CBC
HCT: 43.1 % (ref 39.0–52.0)
Hemoglobin: 15.2 g/dL (ref 13.0–17.0)
MCH: 31.7 pg (ref 26.0–34.0)
MCHC: 35.3 g/dL (ref 30.0–36.0)
MCV: 90 fL (ref 80.0–100.0)
Platelets: 349 10*3/uL (ref 150–400)
RBC: 4.79 MIL/uL (ref 4.22–5.81)
RDW: 13.2 % (ref 11.5–15.5)
WBC: 12.4 10*3/uL — ABNORMAL HIGH (ref 4.0–10.5)
nRBC: 0 % (ref 0.0–0.2)

## 2021-05-12 LAB — COMPREHENSIVE METABOLIC PANEL
ALT: 41 U/L (ref 0–44)
AST: 27 U/L (ref 15–41)
Albumin: 4.7 g/dL (ref 3.5–5.0)
Alkaline Phosphatase: 64 U/L (ref 38–126)
Anion gap: 14 (ref 5–15)
BUN: 15 mg/dL (ref 6–20)
CO2: 24 mmol/L (ref 22–32)
Calcium: 9.8 mg/dL (ref 8.9–10.3)
Chloride: 104 mmol/L (ref 98–111)
Creatinine, Ser: 0.99 mg/dL (ref 0.61–1.24)
GFR, Estimated: >60 mL/min (ref >60)
Glucose, Bld: 129 mg/dL — ABNORMAL HIGH (ref 70–99)
Potassium: 3.3 mmol/L — ABNORMAL LOW (ref 3.5–5.1)
Sodium: 142 mmol/L (ref 135–145)
Total Bilirubin: 1.7 mg/dL — ABNORMAL HIGH (ref 0.3–1.2)
Total Protein: 8 g/dL (ref 6.5–8.1)

## 2021-05-12 LAB — RESP PANEL BY RT-PCR (FLU A&B, COVID) ARPGX2
Influenza A by PCR: NEGATIVE
Influenza B by PCR: NEGATIVE
SARS Coronavirus 2 by RT PCR: NEGATIVE

## 2021-05-12 LAB — LIPASE, BLOOD: Lipase: 30 U/L (ref 11–51)

## 2021-05-12 MED ORDER — MORPHINE SULFATE (PF) 4 MG/ML IV SOLN
4.0000 mg | Freq: Once | INTRAVENOUS | Status: AC
  Administered 2021-05-12: 4 mg via INTRAVENOUS
  Filled 2021-05-12: qty 1

## 2021-05-12 MED ORDER — ONDANSETRON HCL 4 MG/2ML IJ SOLN
4.0000 mg | Freq: Once | INTRAMUSCULAR | Status: AC
  Administered 2021-05-12: 4 mg via INTRAVENOUS
  Filled 2021-05-12: qty 2

## 2021-05-12 MED ORDER — ONDANSETRON 4 MG PO TBDP
4.0000 mg | ORAL_TABLET | Freq: Once | ORAL | Status: AC | PRN
  Administered 2021-05-12: 4 mg via ORAL
  Filled 2021-05-12: qty 1

## 2021-05-12 MED ORDER — POTASSIUM CHLORIDE IN NACL 20-0.9 MEQ/L-% IV SOLN
Freq: Once | INTRAVENOUS | Status: AC
  Filled 2021-05-12: qty 1000

## 2021-05-12 NOTE — ED Triage Notes (Signed)
Pt here with nausea with active vomiting since this morning. He also reports multiple episodes of diarrhea. Hx of gastritis denies diabetes hx. Unable to tolerate solid foods or fluids. Denies fevers.

## 2021-05-12 NOTE — ED Provider Notes (Signed)
Emergency Medicine Provider Triage Evaluation Note  Victor Johnson , a 45 y.o. male  was evaluated in triage.  Pt complains of NVD since this morning.  He describes generalized left lower abdominal pain as well.  Denies any fevers or chills.  He has a history of gastritis states that he has not been able to eat because of it and he is either vomits or has diarrhea.  Chest pain shortness of breath cough or congestion.  Review of Systems  Positive: Nausea vomiting diarrhea Negative: Fever  Physical Exam  BP (!) 186/128   Pulse 63   Temp 98 F (36.7 C)   Resp 16   SpO2 100%  Gen:   Awake, no distress   Resp:  Normal effort  MSK:   Moves extremities without difficulty  Other:  Abdomen soft, no focal abdominal tenderness but there is some generalized tenderness on the lower abdomen.  Medical Decision Making  Medically screening exam initiated at 6:19 PM.  Appropriate orders placed.  Marice Angelino was informed that the remainder of the evaluation will be completed by another provider, this initial triage assessment does not replace that evaluation, and the importance of remaining  the ED until their evaluation is complete.  Patient is a 45 year old male presented with nausea vomiting diarrhea.  THC, gastritis, gastroenteritis, COVID seem like most likely cause of the patient's symptoms. Will obtain abdominal labs and COVID test   Gailen Shelter, [place] 05/12/21 Lauretta Chester    Eber Hong, MD 05/12/21 [phone]

## 2021-05-13 LAB — URINALYSIS, ROUTINE W REFLEX MICROSCOPIC
Bilirubin Urine: NEGATIVE
Glucose, UA: NEGATIVE mg/dL
Hgb urine dipstick: NEGATIVE
Ketones, ur: 20 mg/dL — AB
Leukocytes,Ua: NEGATIVE
Nitrite: NEGATIVE
Protein, ur: 100 mg/dL — AB
Specific Gravity, Urine: 1.033 — ABNORMAL HIGH (ref 1.005–1.030)
pH: 6 (ref 5.0–8.0)

## 2021-05-13 MED ORDER — SUCRALFATE 1 G PO TABS: 1.0000 g | ORAL_TABLET | Freq: Three times a day (TID) | ORAL | 0 refills | Status: DC

## 2021-05-13 MED ORDER — OMEPRAZOLE 20 MG PO CPDR: 20.0000 mg | DELAYED_RELEASE_CAPSULE | Freq: Every day | ORAL | 0 refills | Status: DC

## 2021-05-13 MED ORDER — HYDROCHLOROTHIAZIDE 25 MG PO TABS
25.0000 mg | ORAL_TABLET | Freq: Every day | ORAL | 0 refills | Status: DC
Start: 2021-05-13 — End: 2021-06-05

## 2021-05-13 MED ORDER — ONDANSETRON 4 MG PO TBDP: 4.0000 mg | ORAL_TABLET | Freq: Three times a day (TID) | ORAL | 0 refills | Status: DC | PRN

## 2021-05-13 NOTE — ED Notes (Signed)
PO challenge underway. Pt given ginger ale and crackers.

## 2021-05-13 NOTE — ED Provider Notes (Signed)
[facility] EMERGENCY DEPARTMENT Provider Note   CSN: [id number] Arrival date & time: 05/12/21  1741     History Chief Complaint  Patient presents with   Emesis   Nausea   Diarrhea    Victor Johnson is a 45 y.o. male.  HPI Patient presents with abdominal pain, nausea, vomiting.  Onset was this morning, about 16 hours prior to ED arrival. He has a history of prior similar events going back years. He has no insurance, and no recent primary care [facility] gastroenterology follow-up. He moved here from [place] several years ago, notes that prior to that he was scheduled for GI evaluation. Prior to today's event he was in his usual state of health, no recent medication change, diet change, activity change.  Now, since that began, he has had inability to tolerate p.o., nausea, vomiting, and epigastric discomfort. No chest pain, no dyspnea.    Past Medical History:  Diagnosis Date   Hypertension     There are no problems to display for this patient.   History reviewed. No pertinent surgical history.     History reviewed. No pertinent family history.  Social History   Tobacco Use   Smoking status: Every Day    Pack years: 0.00   Smokeless tobacco: Never  Substance Use Topics   Alcohol use: Yes   Drug use: Yes    Types: Marijuana    Home Medications Prior to Admission medications   Medication Sig Start Date End Date Taking? Authorizing Provider  acetaminophen (TYLENOL) 500 MG tablet Take 500 mg by mouth every 6 (six) hours as needed for moderate pain.   Yes [provider]  amLODipine (NORVASC) 10 MG tablet Take 1 tablet (10 mg total) by mouth daily. Patient not taking: Reported on 05/12/2021 10/13/20   Grayce Sessions, NP  esomeprazole (NEXIUM) 40 MG capsule Take 1 capsule (40 mg total) by mouth daily. Patient not taking: Reported on 05/12/2021 08/11/20   Allayne Butcher, MD  hydrochlorothiazide (HYDRODIURIL) 25 MG tablet Take 1  tablet (25 mg total) by mouth daily. Patient not taking: Reported on 05/12/2021 10/13/20   Grayce Sessions, NP    Allergies    Patient has no known allergies.  Review of Systems   Review of Systems  Constitutional:        Per HPI, otherwise negative  HENT:         Per HPI, otherwise negative  Respiratory:         Per HPI, otherwise negative  Cardiovascular:        Per HPI, otherwise negative  Gastrointestinal:  Positive for abdominal pain, nausea and vomiting.  Endocrine:       Negative aside from HPI  Genitourinary:        Neg aside from HPI   Musculoskeletal:        Per HPI, otherwise negative  Skin: Negative.   Neurological:  Negative for syncope.   Physical Exam Updated Vital Signs BP (!) 145/93   Pulse 88   Temp 97.9 F (36.6 C) (Oral)   Resp 18   Ht 5\' 8"  (1.727 m)   Wt 65.8 kg   SpO2 95%   BMI 22.05 kg/m   Physical Exam Vitals and nursing note reviewed.  Constitutional:      General: He is not in acute distress.    Appearance: He is well-developed.  HENT:     Head: Normocephalic and atraumatic.  Eyes:     Conjunctiva/sclera: Conjunctivae  normal.  Pulmonary:     Effort: Pulmonary effort is normal. No respiratory distress.     Breath sounds: No stridor.  Abdominal:     General: There is no distension.  Skin:    General: Skin is warm and dry.  Neurological:     Mental Status: He is alert and oriented to person, place, and time.    ED Results / Procedures / Treatments   Labs (all labs ordered are listed, but only abnormal results are displayed) Labs Reviewed  COMPREHENSIVE METABOLIC PANEL - Abnormal; Notable for the following components:      Result Value   Potassium 3.3 (*)    Glucose, Bld 129 (*)    Total Bilirubin 1.7 (*)    All other components within normal limits  CBC - Abnormal; Notable for the following components:   WBC 12.4 (*)    All other components within normal limits  RESP PANEL BY RT-PCR (FLU A&B, COVID) ARPGX2  LIPASE,  BLOOD  URINALYSIS, ROUTINE W REFLEX MICROSCOPIC    EKG None  Radiology No results found.  Procedures Procedures   Medications Ordered in ED Medications  0.9 % NaCl with KCl 20 mEq/ L  infusion ( Intravenous New Bag/Given 05/12/21 2247)  ondansetron (ZOFRAN-ODT) disintegrating tablet 4 mg (4 mg Oral Given 05/12/21 1830)  ondansetron (ZOFRAN) injection 4 mg (4 mg Intravenous Given 05/12/21 2213)  morphine 4 MG/ML injection 4 mg (4 mg Intravenous Given 05/12/21 2214)    ED Course  I have reviewed the triage vital signs and the nursing notes.  Pertinent labs & imaging results that were available during my care of the patient were reviewed by me and considered in my medical decision making (see chart for details).   12:17 AM Patient receiving fluids, potassium, states that he feels entirely better. We have discussed all findings again, labs notable for mild hypokalemia, COVID-negative, lipase unremarkable, and suspicion for gastro esophageal etiology given his improvement here with Pepcid, Zofran, fluids, morphine. Patient comfortable with discharge with outpatient GI, primary care follow-up. Without nonperitoneal abdomen, no fever, no substantial leukocytosis, low suspicion for other acute infectious processes.  Patient has no chest pain, suggesting atypical ACS. MDM Rules/Calculators/A&P MDM Number of Diagnoses or Management Options Bilious vomiting with nausea: new, needed workup Blood pressure check: new, needed workup Epigastric pain: new, needed workup   Amount and/or Complexity of Data Reviewed Clinical lab tests: reviewed and ordered Tests in the medicine section of CPT: ordered and reviewed Decide to obtain previous medical records or to obtain history from someone other than the patient: yes Review and summarize past medical records: yes Independent visualization of images, tracings, or specimens: yes  Risk of Complications, Morbidity, and/or Mortality Presenting problems:  high Diagnostic procedures: high Management options: high  Critical Care Total time providing critical care: < 30 minutes  Patient Progress Patient progress: improved   Final Clinical Impression(s) / ED Diagnoses Final diagnoses:  Bilious vomiting with nausea  Epigastric pain  Blood pressure check  Hypokalemia     Gerhard Munch, MD 05/13/21 0020

## 2021-05-13 NOTE — Discharge Instructions (Addendum)
Please be sure to use the provided information to follow-up with her primary care center and our gastroenterology colleagues.  Take all medication as directed and do not hesitate to return here for concerning changes in your condition.

## 2021-06-05 ENCOUNTER — Encounter (INDEPENDENT_AMBULATORY_CARE_PROVIDER_SITE_OTHER): Payer: Self-pay | Admitting: Primary Care

## 2021-06-05 ENCOUNTER — Other Ambulatory Visit: Payer: Self-pay

## 2021-06-05 ENCOUNTER — Ambulatory Visit (INDEPENDENT_AMBULATORY_CARE_PROVIDER_SITE_OTHER): Payer: Self-pay | Admitting: Primary Care

## 2021-06-05 VITALS — BP 167/120 | HR 82 | Resp 16 | Wt 149.4 lb

## 2021-06-05 DIAGNOSIS — Z760 Encounter for issue of repeat prescription: Secondary | ICD-10-CM

## 2021-06-05 DIAGNOSIS — I10 Essential (primary) hypertension: Secondary | ICD-10-CM

## 2021-06-05 DIAGNOSIS — Z0130 Encounter for examination of blood pressure without abnormal findings: Secondary | ICD-10-CM

## 2021-06-05 MED ORDER — HYDROCHLOROTHIAZIDE 25 MG PO TABS: 25.0000 mg | ORAL_TABLET | Freq: Every day | ORAL | 1 refills | Status: DC

## 2021-06-05 MED ORDER — AMLODIPINE BESYLATE 10 MG PO TABS: 10.0000 mg | ORAL_TABLET | Freq: Every day | ORAL | 3 refills | Status: DC

## 2021-06-05 NOTE — Patient Instructions (Signed)

## 2021-06-05 NOTE — Progress Notes (Signed)
[facility]   Victor Johnson is a 45 y.o. male presents for hypertension evaluation, Denies shortness of breath, headaches, chest pain or lower extremity edema, sudden onset, vision changes, unilateral weakness, dizziness, paresthesias   Patient denies adherence with medications.  Dietary habits include: trying to eat healthier  Exercise habits include:walking Family / Social history: HTN   Past Medical History:  Diagnosis Date   Hypertension    No past surgical history on file. No Known Allergies Current Outpatient Medications on File Prior to Visit  Medication Sig Dispense Refill   acetaminophen (TYLENOL) 500 MG tablet Take 500 mg by mouth every 6 (six) hours as needed for moderate pain.     amLODipine (NORVASC) 10 MG tablet Take 1 tablet (10 mg total) by mouth daily. (Patient not taking: Reported on 05/12/2021) 90 tablet 3   esomeprazole (NEXIUM) 40 MG capsule Take 1 capsule (40 mg total) by mouth daily. (Patient not taking: Reported on 05/12/2021) 30 capsule 0   hydrochlorothiazide (HYDRODIURIL) 25 MG tablet Take 1 tablet (25 mg total) by mouth daily. 30 tablet 0   omeprazole (PRILOSEC) 20 MG capsule Take 1 capsule (20 mg total) by mouth daily. Take one tablet daily 21 capsule 0   ondansetron (ZOFRAN ODT) 4 MG disintegrating tablet Take 1 tablet (4 mg total) by mouth every 8 (eight) hours as needed for nausea or vomiting. 20 tablet 0   sucralfate (CARAFATE) 1 g tablet Take 1 tablet (1 g total) by mouth 4 (four) times daily -  with meals and at bedtime. 21 tablet 0   No current facility-administered medications on file prior to visit.   Social History   Socioeconomic History   Marital status: Single    Spouse name: Not on file   Number of children: Not on file   Years of education: Not on file   Highest education level: Not on file  Occupational History   Not on file  Tobacco Use   Smoking status: Every Day   Smokeless tobacco: Never  Substance and Sexual  Activity   Alcohol use: Yes   Drug use: Yes    Types: Marijuana   Sexual activity: Not on file  Other Topics Concern   Not on file  Social History Narrative   Not on file   Social Determinants of Health   Financial Resource Strain: Not on file  Food Insecurity: Not on file  Transportation Needs: Not on file  Physical Activity: Not on file  Stress: Not on file  Social Connections: Not on file  Intimate Partner Violence: Not on file   No family history on file.   OBJECTIVE: Vitals:   06/05/21 1615  BP: (!) 167/120  Pulse: 82  Resp: 16  SpO2: 98%  Weight: 149 lb 6.4 oz (67.8 kg)    Physical Exam  General: Vital signs reviewed.  Patient is well-developed and well-nourished,thin frame male  in no acute distress and cooperative with exam.  Head: Normocephalic and atraumatic. Eyes: EOMI, conjunctivae normal, no scleral icterus.  Neck: Supple, trachea midline, normal ROM, no JVD, masses, thyromegaly, or carotid bruit present.  Cardiovascular: RRR, S1 normal, S2 normal, no murmurs, gallops, or rubs. Pulmonary/Chest: Clear to auscultation bilaterally, no wheezes, rales, or rhonchi. Abdominal: Soft, non-tender, non-distended, BS +, no masses, organomegaly, or guarding present.  Musculoskeletal: No joint deformities, erythema, or stiffness, ROM full and nontender. Extremities: No lower extremity edema bilaterally,  pulses symmetric and intact bilaterally. No cyanosis or clubbing. Neurological: A&O x3, Strength is  normal and symmetric bilaterally, cranial nerve II-XII are grossly intact, no focal motor deficit, sensory intact to light touch bilaterally.  Skin: Warm, dry and intact. No rashes or erythema. Psychiatric: Normal mood and affect. speech and behavior is normal. Cognition and memory are normal.    Review of Systems  All other systems reviewed and are negative.  Last 3 Office BP readings: BP Readings from Last 3 Encounters:  05/13/21 (!) 163/109  12/06/20 127/82   10/13/20 (!) 158/115    BMET    Component Value Date/Time   NA 142 05/12/2021 1807   K 3.3 (L) 05/12/2021 1807   CL 104 05/12/2021 1807   CO2 24 05/12/2021 1807   GLUCOSE 129 (H) 05/12/2021 1807   BUN 15 05/12/2021 1807   CREATININE 0.99 05/12/2021 1807   CALCIUM 9.8 05/12/2021 1807   GFRNONAA >60 05/12/2021 1807   GFRAA >60 05/24/2020 1029    Renal function: CrCl cannot be calculated (Patient's most recent lab result is older than the maximum 21 days allowed.).  Clinical ASCVD: Yes  The ASCVD Risk score Denman George DC Jr., et al., 2013) failed to calculate for the following reasons:   Cannot find a previous HDL lab   Cannot find a previous total cholesterol lab  ASCVD risk factors include- Victor Johnson   ASSESSMENT & PLAN: Victor Johnson was seen today for hypertension.  Diagnoses and all orders for this visit:  Blood pressure check -     -Counseled on lifestyle modifications for blood pressure control including reduced dietary sodium, increased exercise, weight reduction and adequate sleep. Also, educated patient about the risk for cardiovascular events, stroke and heart attack. Also counseled patient about the importance of medication adherence. If you participate in smoking, it is important to stop using tobacco as this will increase the risks associated with uncontrolled blood pressure.   -Hypertension longstanding diagnosed currently amLODipine (NORVASC) 10 MG tablet; Take 1 tablet (10 mg total) by mouth daily. -     hydrochlorothiazide (HYDRODIURIL) 25 MG tablet; Take 1 tablet (25 mg total) by mouth daily.on current medications. Patient is not adherent with current medications.   Goal BP:  For patients younger than 60: Goal BP < 130/80. For patients 60 and older: Goal BP < 140/90. For patients with diabetes: Goal BP < 130/80. Your most recent BP: 167/120  Minimize salt intake. Minimize alcohol intake  Medication refill amLODipine (NORVASC) 10 MG tablet; Take 1 tablet (10 mg  total) by mouth daily. -     hydrochlorothiazide (HYDRODIURIL) 25 MG tablet; Take 1 tablet (25 mg total) by mouth daily.  This note has been created with Education officer, environmental. Any transcriptional errors are unintentional.   Grayce Sessions, NP 06/05/2021, 3:58 PM

## 2021-07-07 ENCOUNTER — Emergency Department (HOSPITAL_COMMUNITY): Payer: Medicaid Other

## 2021-07-07 ENCOUNTER — Emergency Department (HOSPITAL_COMMUNITY)
Admission: EM | Admit: 2021-07-07 | Discharge: 2021-07-07 | Disposition: A | Discharge status: Home / Self Care | Payer: Medicaid Other | Attending: Emergency Medicine | Admitting: Emergency Medicine

## 2021-07-07 ENCOUNTER — Other Ambulatory Visit: Payer: Self-pay

## 2021-07-07 DIAGNOSIS — Z79899 Other long term (current) drug therapy: Secondary | ICD-10-CM | POA: Insufficient documentation

## 2021-07-07 DIAGNOSIS — Y92410 Unspecified street and highway as the place of occurrence of the external cause: Secondary | ICD-10-CM | POA: Insufficient documentation

## 2021-07-07 DIAGNOSIS — I10 Essential (primary) hypertension: Secondary | ICD-10-CM | POA: Diagnosis not present

## 2021-07-07 DIAGNOSIS — S6991XA Unspecified injury of right wrist, hand and finger(s), initial encounter: Secondary | ICD-10-CM | POA: Diagnosis present

## 2021-07-07 DIAGNOSIS — Z23 Encounter for immunization: Secondary | ICD-10-CM | POA: Insufficient documentation

## 2021-07-07 DIAGNOSIS — S63501A Unspecified sprain of right wrist, initial encounter: Secondary | ICD-10-CM | POA: Diagnosis not present

## 2021-07-07 DIAGNOSIS — F17200 Nicotine dependence, unspecified, uncomplicated: Secondary | ICD-10-CM | POA: Insufficient documentation

## 2021-07-07 DIAGNOSIS — S20212A Contusion of left front wall of thorax, initial encounter: Secondary | ICD-10-CM | POA: Insufficient documentation

## 2021-07-07 MED ORDER — OXYCODONE-ACETAMINOPHEN 5-325 MG PO TABS
1.0000 | ORAL_TABLET | Freq: Once | ORAL | Status: AC
  Administered 2021-07-07: 1 via ORAL
  Filled 2021-07-07: qty 1

## 2021-07-07 MED ORDER — TETANUS-DIPHTH-ACELL PERTUSSIS 5-2.5-18.5 LF-MCG/0.5 IM SUSY
0.5000 mL | PREFILLED_SYRINGE | Freq: Once | INTRAMUSCULAR | Status: AC
  Administered 2021-07-07: 0.5 mL via INTRAMUSCULAR
  Filled 2021-07-07: qty 0.5

## 2021-07-07 MED ORDER — IBUPROFEN 600 MG PO TABS: 600.0000 mg | ORAL_TABLET | Freq: Four times a day (QID) | ORAL | 0 refills | Status: DC | PRN

## 2021-07-07 MED ORDER — LIDOCAINE-EPINEPHRINE-TETRACAINE (LET) TOPICAL GEL
3.0000 mL | Freq: Once | TOPICAL | Status: AC
  Administered 2021-07-07: 3 mL via TOPICAL
  Filled 2021-07-07: qty 3

## 2021-07-07 MED ORDER — CYCLOBENZAPRINE HCL 10 MG PO TABS: 10.0000 mg | ORAL_TABLET | Freq: Two times a day (BID) | ORAL | 0 refills | Status: DC | PRN
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
## 2021-07-14 ENCOUNTER — Other Ambulatory Visit: Payer: Self-pay

## 2021-07-14 ENCOUNTER — Encounter (HOSPITAL_COMMUNITY): Payer: Self-pay

## 2021-07-14 ENCOUNTER — Emergency Department (HOSPITAL_COMMUNITY)
Admission: EM | Admit: 2021-07-14 | Discharge: 2021-07-14 | Disposition: A | Discharge status: Home / Self Care | Payer: Medicaid Other | Attending: Emergency Medicine | Admitting: Emergency Medicine

## 2021-07-14 DIAGNOSIS — I10 Essential (primary) hypertension: Secondary | ICD-10-CM | POA: Insufficient documentation

## 2021-07-14 DIAGNOSIS — F17200 Nicotine dependence, unspecified, uncomplicated: Secondary | ICD-10-CM | POA: Insufficient documentation

## 2021-07-14 DIAGNOSIS — M7989 Other specified soft tissue disorders: Secondary | ICD-10-CM | POA: Insufficient documentation

## 2021-07-14 DIAGNOSIS — Z79899 Other long term (current) drug therapy: Secondary | ICD-10-CM | POA: Insufficient documentation

## 2021-07-14 DIAGNOSIS — M25531 Pain in right wrist: Secondary | ICD-10-CM | POA: Insufficient documentation

## 2021-07-14 MED ORDER — IBUPROFEN 800 MG PO TABS
800.0000 mg | ORAL_TABLET | Freq: Once | ORAL | Status: AC
  Administered 2021-07-14: 800 mg via ORAL
  Filled 2021-07-14: qty 1
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
## 2021-07-17 ENCOUNTER — Ambulatory Visit (INDEPENDENT_AMBULATORY_CARE_PROVIDER_SITE_OTHER): Payer: Self-pay | Admitting: Primary Care

## 2021-07-17 ENCOUNTER — Encounter (INDEPENDENT_AMBULATORY_CARE_PROVIDER_SITE_OTHER): Payer: Self-pay | Admitting: Primary Care

## 2021-07-17 ENCOUNTER — Other Ambulatory Visit: Payer: Self-pay

## 2021-07-17 VITALS — BP 123/85 | HR 89 | Temp 97.5°F | Resp 16 | Ht 68.0 in | Wt 146.0 lb

## 2021-07-17 DIAGNOSIS — M25531 Pain in right wrist: Secondary | ICD-10-CM

## 2021-07-17 DIAGNOSIS — I10 Essential (primary) hypertension: Secondary | ICD-10-CM

## 2021-07-17 DIAGNOSIS — E876 Hypokalemia: Secondary | ICD-10-CM

## 2021-07-17 MED ORDER — CYCLOBENZAPRINE HCL 10 MG PO TABS
10.0000 mg | ORAL_TABLET | Freq: Two times a day (BID) | ORAL | 0 refills | Status: DC | PRN
Start: 2021-07-17 — End: 2021-08-31
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
## 2021-07-17 NOTE — Progress Notes (Signed)
Wrist pain Medication refill - cyclobenazeprine

## 2021-07-17 NOTE — Patient Instructions (Addendum)
Wrist Pain, Adult There are many things that can cause wrist pain. Some common causes include: An injury to the wrist. Using the joint too much. A condition that causes too much pressure to be put on a nerve in the wrist (carpal tunnel syndrome). Wear and tear of the joints that happens as a person gets older (osteoarthritis). A condition that causes swelling and stiffness in the joints (arthritis). Sometimes, the cause of wrist pain is not known. Often, the pain goes away when you follow your doctor's instructions for easing pain at home. This may include resting your wrist, icing your wrist, or using a splint or an elastic wrap for a short time. It is important to tell your doctor if your wrist pain does not go away. Follow these instructions at home: If you have a splint or elastic wrap: Wear the splint or wrap as told by your doctor. Take it off only as told by your doctor. Ask if you can take it off for bathing. Loosen the splint or wrap if your fingers: Tingle. Become numb. Turn cold and blue. Check the skin around the splint or wrap every day. Tell your doctor about any concerns. Keep the splint or wrap clean. If the splint or wrap is not waterproof: Do not let it get wet. Cover it with a watertight covering when you take a bath or shower. Managing pain, stiffness, and swelling  If told, put ice on the painful area. To do this: If you have a removable splint or wrap, take it off as told by your doctor. Put ice in a plastic bag. Place a towel between your skin and the bag or between your splint or wrap and the bag. Leave the ice on for 20 minutes, 2-3 times a day. Move your fingers often. Raise (elevate) the injured area above the level of your heart while you are sitting or lying down. Activity Rest your wrist as told by your doctor. Return to your normal activities as told by your doctor. Ask your doctor what activities are safe for you. Ask your doctor when it is safe to  drive if you have a splint or wrap on your wrist. Do exercises as told by your doctor. General instructions Pay attention to any changes in your symptoms. Take over-the-counter and prescription medicines only as told by your doctor. Keep all follow-up visits as told by your doctor. This is important. Contact a doctor if: You have a sudden, sharp pain in the wrist, hand, or arm that is different or new. The swelling or bruising on your wrist or hand gets worse. Your skin: Becomes red. Gets a rash. Has open sores. Your pain does not get better. Your pain gets worse. You have a fever or chills. Get help right away if: You lose feeling in your fingers or hand. Your fingers turn white, very red, or cold and blue. You cannot move your fingers. Summary There are many things that can cause wrist pain. It is important to tell your doctor if your wrist pain does not go away. You may need to wear a splint or a wrap for a short period of time. Return to your normal activities as told by your doctor. Ask your doctor what activities are safe for you. This information is not intended to replace advice given to you by your health care provider. Make sure you discuss any questions you have with your health care provider. Document Revised: 09/10/2019 Document Reviewed: 09/10/2019 Elsevier Patient Education  2022  [employer] Potassium Content of Foods  The body needs potassium to control blood pressure and to keep the muscles and nervous system healthy. Here are some healthy foods below that are high in potassium. Also you can get the white label salt of "NO SALT" salt substitute, 1/4 teaspoon of this is equivalent to potassium.   FOODS AND DRINKS HIGH IN POTASSIUM FOODS MODERATE IN POTASSIUM   Fruits Avocado (cubed),  c / 50 g. Banana (sliced), 75 g. Cantaloupe (cubed), 80 g. Honeydew, 1 wedge / 85 g. Kiwi (sliced), 90 g. Nectarine, 1 small / 129 g. Orange, 1 medium / 131  g. Vegetables Artichoke,  of a medium / 64 g. Asparagus (boiled), 90 g.. Broccoli (boiled), 78 g. Brussels sprout (boiled), 78 g. Butternut squash (baked), 103 g. Chickpea (cooked), 82 g. Green peas (cooked), 80 g. Kidney beans (cooked), 5 tbsp / 55 g. Lima beans (cooked),  c / 43 g. Navy beans (cooked),  c / 61 g. Spinach (cooked),  c / 45 g. Sweet potato (baked),  c / 50 g. Tomato (chopped or sliced), 90 g. Vegetable juice. White mushrooms (cooked), 78 g. Yam (cooked or baked),  c / 34 g. Zucchini squash (boiled), 90 g. Other Foods and Drinks Almonds (whole),  c / 36 g. Fish, 3 oz / 85 g. Nonfat fruit variety yogurt, 123 g. Pistachio nuts, 1 oz / 28 g. Pumpkin seeds, 1 oz / 28 g. Red meat (broiled, cooked, grilled), 3 oz / 85 g. Scallops (steamed), 3 oz / 85 g. Spaghetti sauce,  c / 66 g. Sunflower seeds (dry roasted), 1 oz / 28 g. Veggie burger, 1 patty / 70 g. Fruits Grapefruit,  of the fruit / 123 g Plums (sliced), 83 g. Tangerine, 1 large / 120 g. Vegetables Carrots (boiled), 78 g. Carrots (sliced), 61 g. Rhubarb (cooked with sugar), 120 g. Rutabaga (cooked), 120 g. Yellow snap beans (cooked), 63 g. Other Foods and Drinks  Chicken breast (roasted and chopped),  c / 70 g. Pita bread, 1 large / 64 g. Shrimp (steamed), 4 oz / 113 g. Swiss cheese (diced), 70 g.

## 2021-07-18 LAB — CMP14+EGFR
ALT: 54 IU/L — ABNORMAL HIGH (ref 0–44)
AST: 39 IU/L (ref 0–40)
Albumin/Globulin Ratio: 1.9 (ref 1.2–2.2)
Albumin: 4.7 g/dL (ref 4.0–5.0)
Alkaline Phosphatase: 81 IU/L (ref 44–121)
BUN/Creatinine Ratio: 17 (ref 9–20)
BUN: 14 mg/dL (ref 6–24)
Bilirubin Total: 0.2 mg/dL (ref 0.0–1.2)
CO2: 28 mmol/L (ref 20–29)
Calcium: 9.6 mg/dL (ref 8.7–10.2)
Chloride: 96 mmol/L (ref 96–106)
Creatinine, Ser: 0.82 mg/dL (ref 0.76–1.27)
Globulin, Total: 2.5 g/dL (ref 1.5–4.5)
Glucose: 92 mg/dL (ref 65–99)
Potassium: 4.2 mmol/L (ref 3.5–5.2)
Sodium: 137 mmol/L (ref 134–144)
Total Protein: 7.2 g/dL (ref 6.0–8.5)
eGFR: 112 mL/min/{1.73_m2} (ref >59)

## 2021-08-15 ENCOUNTER — Ambulatory Visit (INDEPENDENT_AMBULATORY_CARE_PROVIDER_SITE_OTHER): Payer: Medicaid Other | Admitting: Primary Care

## 2021-08-31 ENCOUNTER — Ambulatory Visit (INDEPENDENT_AMBULATORY_CARE_PROVIDER_SITE_OTHER): Payer: Self-pay | Admitting: Primary Care

## 2021-08-31 ENCOUNTER — Encounter (INDEPENDENT_AMBULATORY_CARE_PROVIDER_SITE_OTHER): Payer: Self-pay | Admitting: Primary Care

## 2021-08-31 ENCOUNTER — Other Ambulatory Visit: Payer: Self-pay

## 2021-08-31 VITALS — BP 126/89 | HR 107 | Temp 98.4°F | Ht 69.0 in | Wt 152.8 lb

## 2021-08-31 DIAGNOSIS — F17200 Nicotine dependence, unspecified, uncomplicated: Secondary | ICD-10-CM

## 2021-08-31 DIAGNOSIS — Z0130 Encounter for examination of blood pressure without abnormal findings: Secondary | ICD-10-CM

## 2021-08-31 DIAGNOSIS — M25531 Pain in right wrist: Secondary | ICD-10-CM

## 2021-08-31 DIAGNOSIS — E876 Hypokalemia: Secondary | ICD-10-CM

## 2021-08-31 MED ORDER — CYCLOBENZAPRINE HCL 10 MG PO TABS: 10.0000 mg | ORAL_TABLET | Freq: Two times a day (BID) | ORAL | 0 refills | Status: DC | PRN

## 2021-08-31 MED ORDER — HYDROCHLOROTHIAZIDE 25 MG PO TABS: 25.0000 mg | ORAL_TABLET | Freq: Every day | ORAL | 1 refills | Status: DC

## 2021-08-31 MED ORDER — AMLODIPINE BESYLATE 10 MG PO TABS: 10.0000 mg | ORAL_TABLET | Freq: Every day | ORAL | 3 refills | Status: DC

## 2021-08-31 NOTE — Patient Instructions (Signed)
Potassium Content of Foods  The body needs potassium to control blood pressure and to keep the muscles and nervous system healthy. Here are some healthy foods below that are high in potassium. Also you can get the white label salt of "NO SALT" salt substitute, 1/4 teaspoon of this is equivalent to 20meq potassium.   FOODS AND DRINKS HIGH IN POTASSIUM FOODS MODERATE IN POTASSIUM   Fruits Avocado (cubed),  c / 50 g. Banana (sliced), 75 g. Cantaloupe (cubed), 80 g. Honeydew, 1 wedge / 85 g. Kiwi (sliced), 90 g. Nectarine, 1 small / 129 g. Orange, 1 medium / 131 g. Vegetables Artichoke,  of a medium / 64 g. Asparagus (boiled), 90 g.. Broccoli (boiled), 78 g. Brussels sprout (boiled), 78 g. Butternut squash (baked), 103 g. Chickpea (cooked), 82 g. Green peas (cooked), 80 g. Kidney beans (cooked), 5 tbsp / 55 g. Lima beans (cooked),  c / 43 g. Navy beans (cooked),  c / 61 g. Spinach (cooked),  c / 45 g. Sweet potato (baked),  c / 50 g. Tomato (chopped or sliced), 90 g. Vegetable juice. White mushrooms (cooked), 78 g. Yam (cooked or baked),  c / 34 g. Zucchini squash (boiled), 90 g. Other Foods and Drinks Almonds (whole),  c / 36 g. Fish, 3 oz / 85 g. Nonfat fruit variety yogurt, 123 g. Pistachio nuts, 1 oz / 28 g. Pumpkin seeds, 1 oz / 28 g. Red meat (broiled, cooked, grilled), 3 oz / 85 g. Scallops (steamed), 3 oz / 85 g. Spaghetti sauce,  c / 66 g. Sunflower seeds (dry roasted), 1 oz / 28 g. Veggie burger, 1 patty / 70 g. Fruits Grapefruit,  of the fruit / 123 g Plums (sliced), 83 g. Tangerine, 1 large / 120 g. Vegetables Carrots (boiled), 78 g. Carrots (sliced), 61 g. Rhubarb (cooked with sugar), 120 g. Rutabaga (cooked), 120 g. Yellow snap beans (cooked), 63 g. Other Foods and Drinks  Chicken breast (roasted and chopped),  c / 70 g. Pita bread, 1 large / 64 g. Shrimp (steamed), 4 oz / 113 g. Swiss cheese (diced), 70 g.     
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
## 2021-09-15 ENCOUNTER — Encounter (HOSPITAL_COMMUNITY): Payer: Self-pay | Admitting: Emergency Medicine

## 2021-09-15 ENCOUNTER — Other Ambulatory Visit: Payer: Self-pay

## 2021-09-15 ENCOUNTER — Emergency Department (HOSPITAL_COMMUNITY)
Admission: EM | Admit: 2021-09-15 | Discharge: 2021-09-15 | Disposition: A | Discharge status: Home / Self Care | Payer: Medicaid Other | Attending: Emergency Medicine | Admitting: Emergency Medicine

## 2021-09-15 ENCOUNTER — Emergency Department (HOSPITAL_COMMUNITY): Payer: Medicaid Other

## 2021-09-15 DIAGNOSIS — Z79899 Other long term (current) drug therapy: Secondary | ICD-10-CM | POA: Insufficient documentation

## 2021-09-15 DIAGNOSIS — Y92410 Unspecified street and highway as the place of occurrence of the external cause: Secondary | ICD-10-CM | POA: Insufficient documentation

## 2021-09-15 DIAGNOSIS — I10 Essential (primary) hypertension: Secondary | ICD-10-CM | POA: Insufficient documentation

## 2021-09-15 DIAGNOSIS — L0291 Cutaneous abscess, unspecified: Secondary | ICD-10-CM

## 2021-09-15 DIAGNOSIS — S63501D Unspecified sprain of right wrist, subsequent encounter: Secondary | ICD-10-CM | POA: Insufficient documentation

## 2021-09-15 DIAGNOSIS — L02413 Cutaneous abscess of right upper limb: Secondary | ICD-10-CM | POA: Insufficient documentation

## 2021-09-15 DIAGNOSIS — F17200 Nicotine dependence, unspecified, uncomplicated: Secondary | ICD-10-CM | POA: Insufficient documentation

## 2021-09-15 MED ORDER — LIDOCAINE HCL (PF) 1 % IJ SOLN
10.0000 mL | Freq: Once | INTRAMUSCULAR | Status: AC
  Administered 2021-09-15: 10 mL
  Filled 2021-09-15: qty 30

## 2021-09-15 MED ORDER — DOXYCYCLINE HYCLATE 100 MG PO CAPS: 100.0000 mg | ORAL_CAPSULE | Freq: Two times a day (BID) | ORAL | 0 refills | Status: AC
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
## 2021-09-29 ENCOUNTER — Encounter (HOSPITAL_COMMUNITY): Payer: Self-pay | Admitting: Emergency Medicine

## 2021-09-29 ENCOUNTER — Emergency Department (HOSPITAL_COMMUNITY)
Admission: EM | Admit: 2021-09-29 | Discharge: 2021-09-29 | Disposition: A | Discharge status: Home / Self Care | Payer: Medicaid Other | Attending: Emergency Medicine | Admitting: Emergency Medicine

## 2021-09-29 ENCOUNTER — Other Ambulatory Visit: Payer: Self-pay

## 2021-09-29 DIAGNOSIS — R109 Unspecified abdominal pain: Secondary | ICD-10-CM | POA: Insufficient documentation

## 2021-09-29 DIAGNOSIS — Z79899 Other long term (current) drug therapy: Secondary | ICD-10-CM | POA: Insufficient documentation

## 2021-09-29 DIAGNOSIS — F17200 Nicotine dependence, unspecified, uncomplicated: Secondary | ICD-10-CM | POA: Insufficient documentation

## 2021-09-29 DIAGNOSIS — R112 Nausea with vomiting, unspecified: Secondary | ICD-10-CM | POA: Insufficient documentation

## 2021-09-29 DIAGNOSIS — I10 Essential (primary) hypertension: Secondary | ICD-10-CM | POA: Insufficient documentation

## 2021-09-29 DIAGNOSIS — R197 Diarrhea, unspecified: Secondary | ICD-10-CM | POA: Insufficient documentation

## 2021-09-29 LAB — CBC WITH DIFFERENTIAL/PLATELET
Abs Immature Granulocytes: 0.09 10*3/uL — ABNORMAL HIGH (ref 0.00–0.07)
Basophils Absolute: 0.1 10*3/uL (ref 0.0–0.1)
Basophils Relative: 0 %
Eosinophils Absolute: 0 10*3/uL (ref 0.0–0.5)
Eosinophils Relative: 0 %
HCT: 41.5 % (ref 39.0–52.0)
Hemoglobin: 14.8 g/dL (ref 13.0–17.0)
Immature Granulocytes: 1 %
Lymphocytes Relative: 14 %
Lymphs Abs: 2.5 10*3/uL (ref 0.7–4.0)
MCH: 32.5 pg (ref 26.0–34.0)
MCHC: 35.7 g/dL (ref 30.0–36.0)
MCV: 91 fL (ref 80.0–100.0)
Monocytes Absolute: 1.7 10*3/uL — ABNORMAL HIGH (ref 0.1–1.0)
Monocytes Relative: 10 %
Neutro Abs: 13.6 10*3/uL — ABNORMAL HIGH (ref 1.7–7.7)
Neutrophils Relative %: 75 %
Platelets: 419 10*3/uL — ABNORMAL HIGH (ref 150–400)
RBC: 4.56 MIL/uL (ref 4.22–5.81)
RDW: 13.6 % (ref 11.5–15.5)
WBC: 17.9 10*3/uL — ABNORMAL HIGH (ref 4.0–10.5)
nRBC: 0 % (ref 0.0–0.2)

## 2021-09-29 LAB — COMPREHENSIVE METABOLIC PANEL
ALT: 39 U/L (ref 0–44)
AST: 21 U/L (ref 15–41)
Albumin: 4.9 g/dL (ref 3.5–5.0)
Alkaline Phosphatase: 67 U/L (ref 38–126)
Anion gap: 13 (ref 5–15)
BUN: 23 mg/dL — ABNORMAL HIGH (ref 6–20)
CO2: 27 mmol/L (ref 22–32)
Calcium: 9.2 mg/dL (ref 8.9–10.3)
Chloride: 100 mmol/L (ref 98–111)
Creatinine, Ser: 1.51 mg/dL — ABNORMAL HIGH (ref 0.61–1.24)
GFR, Estimated: 58 mL/min — ABNORMAL LOW (ref >60)
Glucose, Bld: 158 mg/dL — ABNORMAL HIGH (ref 70–99)
Potassium: 3.3 mmol/L — ABNORMAL LOW (ref 3.5–5.1)
Sodium: 140 mmol/L (ref 135–145)
Total Bilirubin: 1.6 mg/dL — ABNORMAL HIGH (ref 0.3–1.2)
Total Protein: 8.6 g/dL — ABNORMAL HIGH (ref 6.5–8.1)

## 2021-09-29 LAB — LIPASE, BLOOD: Lipase: 38 U/L (ref 11–51)

## 2021-09-29 MED ORDER — ONDANSETRON HCL 4 MG/2ML IJ SOLN
4.0000 mg | Freq: Once | INTRAMUSCULAR | Status: AC
  Administered 2021-09-29: 4 mg via INTRAVENOUS
  Filled 2021-09-29: qty 2

## 2021-09-29 MED ORDER — ONDANSETRON 4 MG PO TBDP: ORAL_TABLET | ORAL | 0 refills | Status: DC

## 2021-09-29 MED ORDER — MORPHINE SULFATE (PF) 4 MG/ML IV SOLN
4.0000 mg | Freq: Once | INTRAVENOUS | Status: AC
  Administered 2021-09-29: 4 mg via INTRAVENOUS
  Filled 2021-09-29: qty 1

## 2021-09-29 MED ORDER — ALUM & MAG HYDROXIDE-SIMETH 200-200-20 MG/5ML PO SUSP
30.0000 mL | Freq: Once | ORAL | Status: AC
  Administered 2021-09-29: 30 mL via ORAL
  Filled 2021-09-29: qty 30

## 2021-09-29 MED ORDER — SODIUM CHLORIDE 0.9 % IV BOLUS
1000.0000 mL | Freq: Once | INTRAVENOUS | Status: AC
  Administered 2021-09-29: 1000 mL via INTRAVENOUS

## 2021-09-29 NOTE — ED Triage Notes (Signed)
Pt to ER with c/o n/v/d since yesterday.  Pt states abdominal pain only with vomiting.

## 2021-09-29 NOTE — ED Provider Notes (Signed)
[facility]-EMERGENCY DEPT Provider Note   CSN: [id number] Arrival date & time: 09/29/21  3419     History Chief Complaint  Patient presents with   Emesis   Diarrhea    Victor Johnson is a 45 y.o. male.  45 yo M with a chief complaints of nausea vomiting and diarrhea.  This been going on for about 48 hours now.  No known sick contacts no suspicious food intake no recent travel.  He denies any cough congestion or fever.  Mild crampy diffuse abdominal discomfort.  Denies any blood in his stool or his emesis.  The history is provided by the patient.  Illness Severity:  Moderate Onset quality:  Gradual Duration:  2 days Timing:  Constant Progression:  Worsening Chronicity:  New Associated symptoms: abdominal pain, diarrhea, nausea and vomiting   Associated symptoms: no chest pain, no congestion, no fever, no headaches, no myalgias, no rash and no shortness of breath       Past Medical History:  Diagnosis Date   Hypertension     There are no problems to display for this patient.   History reviewed. No pertinent surgical history.     History reviewed. No pertinent family history.  Social History   Tobacco Use   Smoking status: Every Day   Smokeless tobacco: Never  Substance Use Topics   Alcohol use: Yes   Drug use: Yes    Types: Marijuana    Home Medications Prior to Admission medications   Medication Sig Start Date End Date Taking? Authorizing Provider  ondansetron (ZOFRAN-ODT) 4 MG disintegrating tablet 4mg  ODT q4 hours prn nausea/vomit 09/29/21  Yes 10/01/21, DO  amLODipine (NORVASC) 10 MG tablet Take 1 tablet (10 mg total) by mouth daily. 08/31/21   09/02/21, NP  cyclobenzaprine (FLEXERIL) 10 MG tablet Take 1 tablet (10 mg total) by mouth 2 (two) times daily as needed for muscle spasms. 08/31/21   09/02/21, NP  hydrochlorothiazide (HYDRODIURIL) 25 MG tablet Take 1 tablet (25 mg total) by mouth daily. 08/31/21  09/30/21  10/02/21, NP    Allergies    Patient has no known allergies.  Review of Systems   Review of Systems  Constitutional:  Negative for chills and fever.  HENT:  Negative for congestion and facial swelling.   Eyes:  Negative for discharge and visual disturbance.  Respiratory:  Negative for shortness of breath.   Cardiovascular:  Negative for chest pain and palpitations.  Gastrointestinal:  Positive for abdominal pain, diarrhea, nausea and vomiting.  Musculoskeletal:  Negative for arthralgias and myalgias.  Skin:  Negative for color change and rash.  Neurological:  Negative for tremors, syncope and headaches.  Psychiatric/Behavioral:  Negative for confusion and dysphoric mood.    Physical Exam Updated Vital Signs BP (!) 136/94   Pulse 97   Temp 98.2 F (36.8 C) (Oral)   Resp 18   Ht 5\' 8"  (1.727 m)   Wt 68 kg   SpO2 96%   BMI 22.81 kg/m   Physical Exam Vitals and nursing note reviewed.  Constitutional:      Appearance: He is well-developed.  HENT:     Head: Normocephalic and atraumatic.  Eyes:     Pupils: Pupils are equal, round, and reactive to light.  Neck:     Vascular: No JVD.  Cardiovascular:     Rate and Rhythm: Normal rate and regular rhythm.     Heart sounds: No murmur heard.  No friction rub. No gallop.  Pulmonary:     Effort: No respiratory distress.     Breath sounds: No wheezing.  Abdominal:     General: There is no distension.     Tenderness: There is abdominal tenderness (mild diffuse). There is no guarding or rebound.  Musculoskeletal:        General: Normal range of motion.     Cervical back: Normal range of motion and neck supple.  Skin:    Coloration: Skin is not pale.     Findings: No rash.  Neurological:     Mental Status: He is alert and oriented to person, place, and time.  Psychiatric:        Behavior: Behavior normal.    ED Results / Procedures / Treatments   Labs (all labs ordered are listed, but only abnormal  results are displayed) Labs Reviewed  CBC WITH DIFFERENTIAL/PLATELET - Abnormal; Notable for the following components:      Result Value   WBC 17.9 (*)    Platelets 419 (*)    Neutro Abs 13.6 (*)    Monocytes Absolute 1.7 (*)    Abs Immature Granulocytes 0.09 (*)    All other components within normal limits  COMPREHENSIVE METABOLIC PANEL - Abnormal; Notable for the following components:   Potassium 3.3 (*)    Glucose, Bld 158 (*)    BUN 23 (*)    Creatinine, Ser 1.51 (*)    Total Protein 8.6 (*)    Total Bilirubin 1.6 (*)    GFR, Estimated 58 (*)    All other components within normal limits  LIPASE, BLOOD    EKG None  Radiology No results found.  Procedures Procedures   Medications Ordered in ED Medications  sodium chloride 0.9 % bolus 1,000 mL (0 mLs Intravenous Stopped 09/29/21 1116)  ondansetron (ZOFRAN) injection 4 mg (4 mg Intravenous Given 09/29/21 1011)  morphine 4 MG/ML injection 4 mg (4 mg Intravenous Given 09/29/21 1011)  alum & mag hydroxide-simeth (MAALOX/MYLANTA) 200-200-20 MG/5ML suspension 30 mL (30 mLs Oral Given 09/29/21 1136)    ED Course  I have reviewed the triage vital signs and the nursing notes.  Pertinent labs & imaging results that were available during my care of the patient were reviewed by me and considered in my medical decision making (see chart for details).    MDM Rules/Calculators/A&P                           45 yo M with a chief complaints of nausea vomiting and diarrhea.  Going on for a couple days now.  Mild diffuse abdominal discomfort on exam.  We will give a bolus of IV fluids pain and nausea medicine and reassess.  Patient with mild dehydration.  No LFT elevation lipase is unremarkable.  No significant anemia.  Given a bolus of IV fluids pain and nausea medicine with symptomatic improvement.  Now tolerating by mouth.  Will discharge home.  PCP follow-up.  11:51 AM:  I have discussed the diagnosis/risks/treatment options  with the patient and believe the pt to be eligible for discharge home to follow-up with PCP. We also discussed returning to the ED immediately if new or worsening sx occur. We discussed the sx which are most concerning (e.g., sudden worsening pain, fever, inability to tolerate by mouth) that necessitate immediate return. Medications administered to the patient during their visit and any new prescriptions provided to the patient are listed  below.  Medications given during this visit Medications  sodium chloride 0.9 % bolus 1,000 mL (0 mLs Intravenous Stopped 09/29/21 1116)  ondansetron (ZOFRAN) injection 4 mg (4 mg Intravenous Given 09/29/21 1011)  morphine 4 MG/ML injection 4 mg (4 mg Intravenous Given 09/29/21 1011)  alum & mag hydroxide-simeth (MAALOX/MYLANTA) 200-200-20 MG/5ML suspension 30 mL (30 mLs Oral Given 09/29/21 1136)     The patient appears reasonably screen and/or stabilized for discharge and I doubt any other medical condition or other [facility] requiring further screening, evaluation, or treatment in the ED at this time prior to discharge.   Final Clinical Impression(s) / ED Diagnoses Final diagnoses:  Nausea vomiting and diarrhea    Rx / DC Orders ED Discharge Orders          Ordered    ondansetron (ZOFRAN-ODT) 4 MG disintegrating tablet        09/29/21 1149             Melene Plan, DO 09/29/21 1151

## 2021-09-29 NOTE — Discharge Instructions (Signed)
Most likely the cause of your symptoms is a virus.  Should get better in the next day or so.  I prescribed you nausea medicine to help you for your nausea.  He can take Imodium for diarrhea.  Please return for worsening abdominal pain fever or inability to eat or drink.  Follow-up with your family doctor in the office.

## 2021-10-03 ENCOUNTER — Emergency Department (HOSPITAL_BASED_OUTPATIENT_CLINIC_OR_DEPARTMENT_OTHER): Payer: Self-pay

## 2021-10-03 ENCOUNTER — Encounter (HOSPITAL_BASED_OUTPATIENT_CLINIC_OR_DEPARTMENT_OTHER): Payer: Self-pay | Admitting: Emergency Medicine

## 2021-10-03 ENCOUNTER — Other Ambulatory Visit: Payer: Self-pay

## 2021-10-03 ENCOUNTER — Emergency Department (HOSPITAL_BASED_OUTPATIENT_CLINIC_OR_DEPARTMENT_OTHER)
Admission: EM | Admit: 2021-10-03 | Discharge: 2021-10-04 | Disposition: A | Discharge status: Home / Self Care | Payer: Self-pay | Attending: Emergency Medicine | Admitting: Emergency Medicine

## 2021-10-03 DIAGNOSIS — K5730 Diverticulosis of large intestine without perforation or abscess without bleeding: Secondary | ICD-10-CM | POA: Insufficient documentation

## 2021-10-03 DIAGNOSIS — I10 Essential (primary) hypertension: Secondary | ICD-10-CM | POA: Insufficient documentation

## 2021-10-03 DIAGNOSIS — K529 Noninfective gastroenteritis and colitis, unspecified: Secondary | ICD-10-CM | POA: Insufficient documentation

## 2021-10-03 DIAGNOSIS — Z79899 Other long term (current) drug therapy: Secondary | ICD-10-CM | POA: Insufficient documentation

## 2021-10-03 DIAGNOSIS — F17200 Nicotine dependence, unspecified, uncomplicated: Secondary | ICD-10-CM | POA: Insufficient documentation

## 2021-10-03 LAB — COMPREHENSIVE METABOLIC PANEL
ALT: 29 U/L (ref 0–44)
AST: 19 U/L (ref 15–41)
Albumin: 5 g/dL (ref 3.5–5.0)
Alkaline Phosphatase: 60 U/L (ref 38–126)
Anion gap: 12 (ref 5–15)
BUN: 22 mg/dL — ABNORMAL HIGH (ref 6–20)
CO2: 34 mmol/L — ABNORMAL HIGH (ref 22–32)
Calcium: 10.1 mg/dL (ref 8.9–10.3)
Chloride: 92 mmol/L — ABNORMAL LOW (ref 98–111)
Creatinine, Ser: 1.52 mg/dL — ABNORMAL HIGH (ref 0.61–1.24)
GFR, Estimated: 58 mL/min — ABNORMAL LOW (ref >60)
Glucose, Bld: 108 mg/dL — ABNORMAL HIGH (ref 70–99)
Potassium: 3.1 mmol/L — ABNORMAL LOW (ref 3.5–5.1)
Sodium: 138 mmol/L (ref 135–145)
Total Bilirubin: 1.5 mg/dL — ABNORMAL HIGH (ref 0.3–1.2)
Total Protein: 8.1 g/dL (ref 6.5–8.1)

## 2021-10-03 LAB — LIPASE, BLOOD: Lipase: 40 U/L (ref 11–51)

## 2021-10-03 LAB — CBC
HCT: 44.5 % (ref 39.0–52.0)
Hemoglobin: 15.3 g/dL (ref 13.0–17.0)
MCH: 31.7 pg (ref 26.0–34.0)
MCHC: 34.4 g/dL (ref 30.0–36.0)
MCV: 92.1 fL (ref 80.0–100.0)
Platelets: 385 10*3/uL (ref 150–400)
RBC: 4.83 MIL/uL (ref 4.22–5.81)
RDW: 12.9 % (ref 11.5–15.5)
WBC: 9.7 10*3/uL (ref 4.0–10.5)
nRBC: 0 % (ref 0.0–0.2)

## 2021-10-03 MED ORDER — IOHEXOL 300 MG/ML  SOLN
100.0000 mL | Freq: Once | INTRAMUSCULAR | Status: AC | PRN
  Administered 2021-10-03: 100 mL via INTRAVENOUS

## 2021-10-03 MED ORDER — ONDANSETRON HCL 4 MG/2ML IJ SOLN
4.0000 mg | Freq: Once | INTRAMUSCULAR | Status: AC
  Administered 2021-10-03: 4 mg via INTRAVENOUS
  Filled 2021-10-03: qty 2

## 2021-10-03 MED ORDER — SODIUM CHLORIDE 0.9 % IV BOLUS
1000.0000 mL | Freq: Once | INTRAVENOUS | Status: AC
  Administered 2021-10-03: 1000 mL via INTRAVENOUS

## 2021-10-03 NOTE — ED Notes (Signed)
Patient made aware that urine sample is needed. Patient given specimen cup.

## 2021-10-03 NOTE — ED Provider Notes (Signed)
[facility] EMERGENCY DEPT Provider Note   CSN: [id number] Arrival date & time: 10/03/21  1751     History Chief Complaint  Patient presents with   Abdominal Pain    Victor Johnson is a 45 y.o. male.  HPI     This a 45 year old male with a history of hypertension who presents with left lower quadrant pain, nausea, vomiting, diarrhea.  He was seen and evaluated on Friday after Thanksgiving.  He was told he had gastroenteritis.  He was sent home with pain and nausea medication.  He states that he felt much better but then when he got home his symptoms "were provoked by smelling my wife's cooking."  He states he had recurrence of nonbilious, nonbloody emesis.  Diarrhea improved but he was given medications for diarrhea.  He reports persistent left lower quadrant pain.  Rates his pain at 6 out of 10.  Has not had any fevers.  No known history of diverticulitis or diverticulosis.  Chart reviewed.  Labs notable for leukocytosis on Friday to 17.  Otherwise largely reassuring.  No imaging obtained.  Past Medical History:  Diagnosis Date   Hypertension     There are no problems to display for this patient.   History reviewed. No pertinent surgical history.     History reviewed. No pertinent family history.  Social History   Tobacco Use   Smoking status: Every Day   Smokeless tobacco: Never  Substance Use Topics   Alcohol use: Yes   Drug use: Yes    Types: Marijuana    Home Medications Prior to Admission medications   Medication Sig Start Date End Date Taking? Authorizing Provider  promethazine (PHENERGAN) 25 MG tablet Take 1 tablet (25 mg total) by mouth every 6 (six) hours as needed for nausea or vomiting. 10/04/21  Yes Alianna Wurster, Mayer Masker, MD  amLODipine (NORVASC) 10 MG tablet Take 1 tablet (10 mg total) by mouth daily. 08/31/21   Grayce Sessions, NP  cyclobenzaprine (FLEXERIL) 10 MG tablet Take 1 tablet (10 mg total) by mouth 2 (two) times daily as  needed for muscle spasms. 08/31/21   Grayce Sessions, NP  hydrochlorothiazide (HYDRODIURIL) 25 MG tablet Take 1 tablet (25 mg total) by mouth daily. 08/31/21 09/30/21  Grayce Sessions, NP  ondansetron (ZOFRAN-ODT) 4 MG disintegrating tablet 4mg  ODT q4 hours prn nausea/vomit 09/29/21   10/01/21, DO    Allergies    Patient has no known allergies.  Review of Systems   Review of Systems  Constitutional:  Negative for fever.  Respiratory:  Negative for shortness of breath.   Cardiovascular:  Negative for chest pain.  Gastrointestinal:  Positive for abdominal pain, diarrhea, nausea and vomiting.  Genitourinary:  Negative for dysuria.  All other systems reviewed and are negative.  Physical Exam Updated Vital Signs BP (!) 138/99   Pulse 84   Temp 98.2 F (36.8 C)   Resp 14   Ht 1.727 m (5\' 8" )   Wt 65.8 kg   SpO2 95%   BMI 22.05 kg/m   Physical Exam Vitals and nursing note reviewed.  Constitutional:      Appearance: He is well-developed. He is not ill-appearing.  HENT:     Head: Normocephalic and atraumatic.     Mouth/Throat:     Mouth: Mucous membranes are moist.  Eyes:     Pupils: Pupils are equal, round, and reactive to light.  Cardiovascular:     Rate and Rhythm: Normal rate and regular  rhythm.     Heart sounds: Normal heart sounds. No murmur heard. Pulmonary:     Effort: Pulmonary effort is normal. No respiratory distress.     Breath sounds: Normal breath sounds. No wheezing.  Abdominal:     General: Bowel sounds are normal.     Palpations: Abdomen is soft.     Tenderness: There is abdominal tenderness in the left lower quadrant. There is no guarding or rebound.  Musculoskeletal:     Cervical back: Neck supple.  Lymphadenopathy:     Cervical: No cervical adenopathy.  Skin:    General: Skin is warm and dry.  Neurological:     Mental Status: He is alert and oriented to person, place, and time.  Psychiatric:        Mood and Affect: Mood normal.    ED  Results / Procedures / Treatments   Labs (all labs ordered are listed, but only abnormal results are displayed) Labs Reviewed  COMPREHENSIVE METABOLIC PANEL - Abnormal; Notable for the following components:      Result Value   Potassium 3.1 (*)    Chloride 92 (*)    CO2 34 (*)    Glucose, Bld 108 (*)    BUN 22 (*)    Creatinine, Ser 1.52 (*)    Total Bilirubin 1.5 (*)    GFR, Estimated 58 (*)    All other components within normal limits  URINALYSIS, ROUTINE W REFLEX MICROSCOPIC - Abnormal; Notable for the following components:   Specific Gravity, Urine >1.046 (*)    Hgb urine dipstick TRACE (*)    Ketones, ur 15 (*)    Protein, ur 30 (*)    All other components within normal limits  LIPASE, BLOOD  CBC    EKG None  Radiology CT ABDOMEN PELVIS W CONTRAST  Result Date: 10/04/2021 CLINICAL DATA:  Left lower quadrant abdominal pain. EXAM: CT ABDOMEN AND PELVIS WITH CONTRAST TECHNIQUE: Multidetector CT imaging of the abdomen and pelvis was performed using the standard protocol following bolus administration of intravenous contrast. CONTRAST:  OMNIPAQUE IOHEXOL 300 MG/ML  SOLN COMPARISON:  None. FINDINGS: Lower chest: Minimal bibasilar dependent atelectasis. The visualized lung bases are otherwise clear. No intra-abdominal free air or free fluid. Hepatobiliary: No focal liver abnormality is seen. No gallstones, gallbladder wall thickening, or biliary dilatation. Pancreas: Unremarkable. No pancreatic ductal dilatation or surrounding inflammatory changes. Spleen: Normal in size without focal abnormality. Adrenals/Urinary Tract: Adrenal glands are unremarkable. Kidneys are normal, without renal calculi, focal lesion, or hydronephrosis. Bladder is unremarkable. Stomach/Bowel: There is sigmoid diverticulosis and scattered colonic diverticula without active inflammatory changes. There is mild thickened appearance of loops of proximal small bowel in the upper abdomen concerning for enteritis.  No bowel obstruction. The appendix is normal. Vascular/Lymphatic: The abdominal aorta and IVC are unremarkable. No portal venous gas. There is no adenopathy. Reproductive: The prostate and seminal vesicles are grossly unremarkable. No pelvic mass. Other: None Musculoskeletal: No acute or significant osseous findings. IMPRESSION: 1. Findings concerning for enteritis. No bowel obstruction. Normal appendix. 2. Colonic diverticulosis. Electronically Signed   By: Elgie Collard M.D.   On: 10/04/2021 00:07    Procedures Procedures   Medications Ordered in ED Medications  promethazine (PHENERGAN) 12.5 mg in sodium chloride 0.9 % 50 mL IVPB (has no administration in time range)  sodium chloride 0.9 % bolus 1,000 mL (0 mLs Intravenous Stopped 10/04/21 0057)  ondansetron (ZOFRAN) injection 4 mg (4 mg Intravenous Given 10/03/21 2344)  iohexol (OMNIPAQUE) 300  MG/ML solution 100 mL (100 mLs Intravenous Contrast Given 10/03/21 2356)    ED Course  I have reviewed the triage vital signs and the nursing notes.  Pertinent labs & imaging results that were available during my care of the patient were reviewed by me and considered in my medical decision making (see chart for details).    MDM Rules/Calculators/A&P                           Patient presents with ongoing abdominal pain, nausea, vomiting, diarrhea.  He is nontoxic-appearing and vital signs are largely reassuring.  He localizes abdominal pain to the left lower quadrant.  This raises potential for diverticulitis.  Other considerations include gastroenteritis, gastritis, pancreatitis.  Will repeat lab work.  Patient was given pain and nausea medication.  CT scan obtained.  She has no evidence of diverticulitis.  CT findings consistent with enteritis.  Likely viral in nature.  Will treat supportively.  After history, exam, and medical workup I feel the patient has been appropriately medically screened and is safe for discharge home. Pertinent  diagnoses were discussed with the patient. Patient was given return precautions.  Final Clinical Impression(s) / ED Diagnoses Final diagnoses:  Gastroenteritis    Rx / DC Orders ED Discharge Orders          Ordered    promethazine (PHENERGAN) 25 MG tablet  Every 6 hours PRN        10/04/21 0255             Shon Baton, MD 10/04/21 [phone]

## 2021-10-03 NOTE — ED Triage Notes (Signed)
Pt arrives pov, c/o LLQ pain with n/v/d/ endorses treatment at [facility] on 11/25.

## 2021-10-04 LAB — URINALYSIS, ROUTINE W REFLEX MICROSCOPIC
Bilirubin Urine: NEGATIVE
Glucose, UA: NEGATIVE mg/dL
Ketones, ur: 15 mg/dL — AB
Leukocytes,Ua: NEGATIVE
Nitrite: NEGATIVE
Protein, ur: 30 mg/dL — AB
Specific Gravity, Urine: >1.046 — ABNORMAL HIGH (ref 1.005–1.030)
pH: 6.5 (ref 5.0–8.0)

## 2021-10-04 MED ORDER — SODIUM CHLORIDE 0.9 % IV SOLN
12.5000 mg | Freq: Four times a day (QID) | INTRAVENOUS | Status: DC | PRN
  Filled 2021-10-04: qty 0.5

## 2021-10-04 MED ORDER — PROMETHAZINE HCL 25 MG/ML IJ SOLN
INTRAMUSCULAR | Status: AC
  Administered 2021-10-04: 12.5 mg via INTRAVENOUS
  Filled 2021-10-04: qty 1

## 2021-10-04 MED ORDER — PROMETHAZINE HCL 25 MG PO TABS: 25.0000 mg | ORAL_TABLET | Freq: Four times a day (QID) | ORAL | 0 refills | Status: DC | PRN

## 2021-10-04 NOTE — Discharge Instructions (Signed)
You were seen today for ongoing nausea, vomiting, abdominal pain.  Your work-up is largely reassuring.  Your CT scan is consistent with gastroenteritis.  Make sure you are staying hydrated at home.  Take Phenergan for nausea.

## 2022-01-18 ENCOUNTER — Other Ambulatory Visit: Payer: Self-pay

## 2022-01-18 ENCOUNTER — Encounter (HOSPITAL_BASED_OUTPATIENT_CLINIC_OR_DEPARTMENT_OTHER): Payer: Self-pay

## 2022-01-18 DIAGNOSIS — Z79899 Other long term (current) drug therapy: Secondary | ICD-10-CM | POA: Insufficient documentation

## 2022-01-18 DIAGNOSIS — R1084 Generalized abdominal pain: Secondary | ICD-10-CM | POA: Insufficient documentation

## 2022-01-18 DIAGNOSIS — I10 Essential (primary) hypertension: Secondary | ICD-10-CM | POA: Insufficient documentation

## 2022-01-18 DIAGNOSIS — R112 Nausea with vomiting, unspecified: Secondary | ICD-10-CM | POA: Insufficient documentation

## 2022-01-18 DIAGNOSIS — D72829 Elevated white blood cell count, unspecified: Secondary | ICD-10-CM | POA: Insufficient documentation

## 2022-01-18 DIAGNOSIS — R197 Diarrhea, unspecified: Secondary | ICD-10-CM | POA: Insufficient documentation

## 2022-01-18 LAB — CBC
HCT: 39.1 % (ref 39.0–52.0)
Hemoglobin: 13.4 g/dL (ref 13.0–17.0)
MCH: 31.2 pg (ref 26.0–34.0)
MCHC: 34.3 g/dL (ref 30.0–36.0)
MCV: 91.1 fL (ref 80.0–100.0)
Platelets: 415 10*3/uL — ABNORMAL HIGH (ref 150–400)
RBC: 4.29 MIL/uL (ref 4.22–5.81)
RDW: 13.2 % (ref 11.5–15.5)
WBC: 11.8 10*3/uL — ABNORMAL HIGH (ref 4.0–10.5)
nRBC: 0 % (ref 0.0–0.2)

## 2022-01-18 LAB — COMPREHENSIVE METABOLIC PANEL
ALT: 26 U/L (ref 0–44)
AST: 21 U/L (ref 15–41)
Albumin: 4.8 g/dL (ref 3.5–5.0)
Alkaline Phosphatase: 65 U/L (ref 38–126)
Anion gap: 12 (ref 5–15)
BUN: 14 mg/dL (ref 6–20)
CO2: 28 mmol/L (ref 22–32)
Calcium: 10.3 mg/dL (ref 8.9–10.3)
Chloride: 106 mmol/L (ref 98–111)
Creatinine, Ser: 1.05 mg/dL (ref 0.61–1.24)
GFR, Estimated: >60 mL/min (ref >60)
Glucose, Bld: 126 mg/dL — ABNORMAL HIGH (ref 70–99)
Potassium: 4 mmol/L (ref 3.5–5.1)
Sodium: 146 mmol/L — ABNORMAL HIGH (ref 135–145)
Total Bilirubin: 0.9 mg/dL (ref 0.3–1.2)
Total Protein: 8.1 g/dL (ref 6.5–8.1)

## 2022-01-18 LAB — LIPASE, BLOOD: Lipase: 14 U/L (ref 11–51)

## 2022-01-18 MED ORDER — ONDANSETRON HCL 4 MG/2ML IJ SOLN
4.0000 mg | Freq: Once | INTRAMUSCULAR | Status: AC | PRN
  Administered 2022-01-18: 4 mg via INTRAVENOUS
  Filled 2022-01-18: qty 2

## 2022-01-18 NOTE — ED Triage Notes (Signed)
Pt states he has experienced N/V/D, starting this am.  Has not been able to keep anything down. ?

## 2022-01-19 ENCOUNTER — Emergency Department (HOSPITAL_BASED_OUTPATIENT_CLINIC_OR_DEPARTMENT_OTHER)
Admission: EM | Admit: 2022-01-19 | Discharge: 2022-01-19 | Disposition: A | Discharge status: Home / Self Care | Payer: Medicaid Other | Attending: Emergency Medicine | Admitting: Emergency Medicine

## 2022-01-19 DIAGNOSIS — R112 Nausea with vomiting, unspecified: Secondary | ICD-10-CM

## 2022-01-19 HISTORY — DX: Gastritis, unspecified, without bleeding: K29.70

## 2022-01-19 LAB — URINALYSIS, ROUTINE W REFLEX MICROSCOPIC
Bilirubin Urine: NEGATIVE
Glucose, UA: NEGATIVE mg/dL
Hgb urine dipstick: NEGATIVE
Ketones, ur: 15 mg/dL — AB
Leukocytes,Ua: NEGATIVE
Nitrite: NEGATIVE
Protein, ur: 30 mg/dL — AB
Specific Gravity, Urine: 1.032 — ABNORMAL HIGH (ref 1.005–1.030)
pH: 7.5 (ref 5.0–8.0)

## 2022-01-19 MED ORDER — METOCLOPRAMIDE HCL 5 MG/ML IJ SOLN
10.0000 mg | Freq: Once | INTRAMUSCULAR | Status: AC
Start: 2022-01-19 — End: 2022-01-19
  Administered 2022-01-19: 10 mg via INTRAVENOUS
  Filled 2022-01-19: qty 2

## 2022-01-19 MED ORDER — SODIUM CHLORIDE 0.9 % IV BOLUS
1000.0000 mL | Freq: Once | INTRAVENOUS | Status: AC
  Administered 2022-01-19: 1000 mL via INTRAVENOUS

## 2022-01-19 MED ORDER — ONDANSETRON 4 MG PO TBDP: 4.0000 mg | ORAL_TABLET | Freq: Three times a day (TID) | ORAL | 0 refills | Status: DC | PRN

## 2022-01-19 MED ORDER — PROCHLORPERAZINE MALEATE 10 MG PO TABS: 10.0000 mg | ORAL_TABLET | Freq: Two times a day (BID) | ORAL | 0 refills | Status: DC | PRN

## 2022-01-19 NOTE — ED Notes (Signed)
Pt made aware that UA needs to be collected // urinal at bedside ?

## 2022-01-19 NOTE — ED Provider Notes (Signed)
?[facility] EMERGENCY DEPT ?Provider Note ? ?CSN: [id number] ?Arrival date & time: 01/18/22 2215 ? ?Chief Complaint(s) ?Emesis ? ?HPI ?Victor Johnson is a 46 y.o. male   ? ?The history is provided by the patient.  ?GI Problem ?This is a new problem. The current episode started 12 to 24 hours ago. The problem occurs hourly. The problem has not changed since onset.Pertinent negatives include no chest pain, no abdominal pain, no headaches and no shortness of breath. Nothing aggravates the symptoms. Nothing relieves the symptoms. He has tried nothing for the symptoms.  ? ?Past Medical History ?Past Medical History:  ?Diagnosis Date  ? Gastritis   ? Hypertension   ? ?There are no problems to display for this patient. ? ?Home Medication(s) ?Prior to Admission medications   ?Medication Sig Start Date End Date Taking? Authorizing Provider  ?amLODipine (NORVASC) 10 MG tablet Take 1 tablet (10 mg total) by mouth daily. 08/31/21   Grayce Sessions, NP  ?cyclobenzaprine (FLEXERIL) 10 MG tablet Take 1 tablet (10 mg total) by mouth 2 (two) times daily as needed for muscle spasms. 08/31/21   Grayce Sessions, NP  ?hydrochlorothiazide (HYDRODIURIL) 25 MG tablet Take 1 tablet (25 mg total) by mouth daily. 08/31/21 09/30/21  Grayce Sessions, NP  ?ondansetron (ZOFRAN-ODT) 4 MG disintegrating tablet Take 1 tablet (4 mg total) by mouth every 8 (eight) hours as needed for up to 5 days for nausea or vomiting. 4mg  ODT q4 hours prn nausea/vomit 01/19/22 01/24/22  Taren Dymek, 01/26/22, MD  ?promethazine (PHENERGAN) 25 MG tablet Take 1 tablet (25 mg total) by mouth every 6 (six) hours as needed for nausea or vomiting. 10/04/21   Horton, 10/06/21, MD  ?                                                                                                                                  ?Allergies ?Patient has no known allergies. ? ?Review of Systems ?Review of Systems  ?Constitutional:  Negative for fever.  ?Respiratory:   Negative for shortness of breath.   ?Cardiovascular:  Negative for chest pain.  ?Gastrointestinal:  Positive for diarrhea, nausea and vomiting. Negative for abdominal pain.  ?Neurological:  Negative for headaches.  ?As noted in HPI ? ?Physical Exam ?Vital Signs  ?I have reviewed the triage vital signs ?BP (!) 147/111   Pulse 72   Temp 98.4 ?F (36.9 ?C)   Resp 18   Ht 5\' 7"  (1.702 m)   Wt 68 kg   SpO2 99%   BMI 23.49 kg/m?  ? ?Physical Exam ?Vitals reviewed.  ?Constitutional:   ?   General: He is not in acute distress. ?   Appearance: He is well-developed. He is not diaphoretic.  ?HENT:  ?   Head: Normocephalic and atraumatic.  ?   Right Ear: External ear normal.  ?   Left Ear: External ear normal.  ?   Nose: Nose  normal.  ?   Mouth/Throat:  ?   Mouth: Mucous membranes are moist.  ?Eyes:  ?   General: No scleral icterus. ?   Conjunctiva/sclera: Conjunctivae normal.  ?Neck:  ?   Trachea: Phonation normal.  ?Cardiovascular:  ?   Rate and Rhythm: Normal rate and regular rhythm.  ?Pulmonary:  ?   Effort: Pulmonary effort is normal. No respiratory distress.  ?   Breath sounds: No stridor.  ?Abdominal:  ?   General: There is no distension.  ?   Tenderness: There is generalized abdominal tenderness (mild discomfort). There is no guarding or rebound.  ?Musculoskeletal:     ?   General: Normal range of motion.  ?   Cervical back: Normal range of motion.  ?Neurological:  ?   Mental Status: He is alert and oriented to person, place, and time.  ?Psychiatric:     ?   Behavior: Behavior normal.  ? ? ?ED Results and Treatments ?Labs ?(all labs ordered are listed, but only abnormal results are displayed) ?Labs Reviewed  ?COMPREHENSIVE METABOLIC PANEL - Abnormal; Notable for the following components:  ?    Result Value  ? Sodium 146 (*)   ? Glucose, Bld 126 (*)   ? All other components within normal limits  ?CBC - Abnormal; Notable for the following components:  ? WBC 11.8 (*)   ? Platelets 415 (*)   ? All other components  within normal limits  ?URINALYSIS, ROUTINE W REFLEX MICROSCOPIC - Abnormal; Notable for the following components:  ? Specific Gravity, Urine 1.032 (*)   ? Ketones, ur 15 (*)   ? Protein, ur 30 (*)   ? All other components within normal limits  ?LIPASE, BLOOD  ?                                                                                                                       ?EKG ? EKG Interpretation ? ?Date/Time:    ?Ventricular Rate:    ?PR Interval:    ?QRS Duration:   ?QT Interval:    ?QTC Calculation:   ?R Axis:     ?Text Interpretation:   ?  ? ?  ? ?Radiology ?No results found. ? ?Pertinent labs & imaging results that were available during my care of the patient were reviewed by me and considered in my medical decision making (see MDM for details). ? ?Medications Ordered in ED ?Medications  ?ondansetron (ZOFRAN) injection 4 mg (4 mg Intravenous Given 01/18/22 2351)  ?sodium chloride 0.9 % bolus 1,000 mL (0 mLs Intravenous Stopped 01/19/22 0331)  ?metoCLOPramide (REGLAN) injection 10 mg (10 mg Intravenous Given 01/19/22 0127)  ?                                                               ?                                                                    ?  Procedures ?Procedures ? ?(including critical care time) ? ?Medical Decision Making / ED Course ? ? ? Complexity of Problem: ? ?Co-morbidities/SDOH that complicate the patient evaluation/care: ?none ? ?Additional history obtained: ?none ? ?Patient's presenting problem/concern and DDX listed below: ?N/V/D ?Viral gastroenteritis, pancreatitis, cholecystitis, colitis ? ? ?  Complexity of Data: ?  ?Cardiac Monitoring: ?none ? ?Laboratory Tests ordered listed below with my independent interpretation: ?CBC with mild leukocytosis.  No anemia ?No significant electrolyte derangements or renal sufficiency ?No evidence of bili obstruction or pancreatitis ?  ?Imaging Studies ordered listed below with my independent interpretation: ?CT considered but felt not to be  necessary at this time ?  ?  ?ED Course:   ? ?Hospitalization Considered:  ?Yes, if unable to tolerate oral intake ? ?Assessment, Intervention, and Reassessment: ?N/V/D ?Most consistent with viral gastroenteritis ?Low suspicion for serious intra-abdominal inflammatory/infectious process requiring imaging at this time ?Treated symptomatically with antiemetic and IV fluids. ?Has been able to tolerate oral intake.  ? ? ?Final Clinical Impression(s) / ED Diagnoses ?Final diagnoses:  ?Nausea vomiting and diarrhea  ? ?The patient appears reasonably screened and/or stabilized for discharge and I doubt any other medical condition or other [facility] requiring further screening, evaluation, or treatment in the ED at this time prior to discharge. Safe for discharge with strict return precautions. ? ?Disposition: Discharge ? ?Condition: Good ? ?I have discussed the results, Dx and Tx plan with the patient/family who expressed understanding and agree(s) with the plan. Discharge instructions discussed at length. The patient/family was given strict return precautions who verbalized understanding of the instructions. No further questions at time of discharge.  ? ? ?ED Discharge Orders   ? ?      Ordered  ?  ondansetron (ZOFRAN-ODT) 4 MG disintegrating tablet  Every 8 hours PRN       ? 01/19/22 0424  ? ?  ?  ? ?  ? ? ? ?Follow Up: ?Grayce Sessions, NP ?[id number] ?[place] [address] ?[phone] ? ?Call  ?to schedule an appointment for close follow up ? ? ? ?  ? ? ? ? ? ?This chart was dictated using voice recognition software.  Despite best efforts to proofread,  errors can occur which can change the documentation meaning. ? ?  ?Nira Conn, MD ?01/19/22 [phone] ? ?

## 2022-01-19 NOTE — ED Notes (Signed)
Tolerated po fluids

## 2022-01-22 ENCOUNTER — Encounter (HOSPITAL_COMMUNITY): Payer: Self-pay

## 2022-01-22 ENCOUNTER — Emergency Department (HOSPITAL_COMMUNITY)
Admission: EM | Admit: 2022-01-22 | Discharge: 2022-01-22 | Disposition: A | Discharge status: Home / Self Care | Payer: Commercial Managed Care - HMO | Attending: Emergency Medicine | Admitting: Emergency Medicine

## 2022-01-22 ENCOUNTER — Emergency Department (HOSPITAL_COMMUNITY): Payer: Commercial Managed Care - HMO

## 2022-01-22 DIAGNOSIS — K529 Noninfective gastroenteritis and colitis, unspecified: Secondary | ICD-10-CM | POA: Insufficient documentation

## 2022-01-22 DIAGNOSIS — E876 Hypokalemia: Secondary | ICD-10-CM | POA: Diagnosis not present

## 2022-01-22 DIAGNOSIS — R112 Nausea with vomiting, unspecified: Secondary | ICD-10-CM

## 2022-01-22 DIAGNOSIS — D72829 Elevated white blood cell count, unspecified: Secondary | ICD-10-CM | POA: Diagnosis not present

## 2022-01-22 DIAGNOSIS — I10 Essential (primary) hypertension: Secondary | ICD-10-CM | POA: Diagnosis not present

## 2022-01-22 LAB — COMPREHENSIVE METABOLIC PANEL
ALT: 23 U/L (ref 0–44)
AST: 17 U/L (ref 15–41)
Albumin: 4.7 g/dL (ref 3.5–5.0)
Alkaline Phosphatase: 60 U/L (ref 38–126)
Anion gap: 10 (ref 5–15)
BUN: 15 mg/dL (ref 6–20)
CO2: 30 mmol/L (ref 22–32)
Calcium: 9.5 mg/dL (ref 8.9–10.3)
Chloride: 100 mmol/L (ref 98–111)
Creatinine, Ser: 1.17 mg/dL (ref 0.61–1.24)
GFR, Estimated: >60 mL/min (ref >60)
Glucose, Bld: 114 mg/dL — ABNORMAL HIGH (ref 70–99)
Potassium: 3 mmol/L — ABNORMAL LOW (ref 3.5–5.1)
Sodium: 140 mmol/L (ref 135–145)
Total Bilirubin: 1.1 mg/dL (ref 0.3–1.2)
Total Protein: 8.2 g/dL — ABNORMAL HIGH (ref 6.5–8.1)

## 2022-01-22 LAB — URINALYSIS, ROUTINE W REFLEX MICROSCOPIC
Bilirubin Urine: NEGATIVE
Glucose, UA: NEGATIVE mg/dL
Hgb urine dipstick: NEGATIVE
Ketones, ur: NEGATIVE mg/dL
Leukocytes,Ua: NEGATIVE
Nitrite: NEGATIVE
Protein, ur: NEGATIVE mg/dL
Specific Gravity, Urine: 1.045 — ABNORMAL HIGH (ref 1.005–1.030)
pH: 6 (ref 5.0–8.0)

## 2022-01-22 LAB — CBC WITH DIFFERENTIAL/PLATELET
Abs Immature Granulocytes: 0.04 10*3/uL (ref 0.00–0.07)
Basophils Absolute: 0.1 10*3/uL (ref 0.0–0.1)
Basophils Relative: 0 %
Eosinophils Absolute: 0.2 10*3/uL (ref 0.0–0.5)
Eosinophils Relative: 2 %
HCT: 44.3 % (ref 39.0–52.0)
Hemoglobin: 15.1 g/dL (ref 13.0–17.0)
Immature Granulocytes: 0 %
Lymphocytes Relative: 25 %
Lymphs Abs: 3.1 10*3/uL (ref 0.7–4.0)
MCH: 31.5 pg (ref 26.0–34.0)
MCHC: 34.1 g/dL (ref 30.0–36.0)
MCV: 92.5 fL (ref 80.0–100.0)
Monocytes Absolute: 1 10*3/uL (ref 0.1–1.0)
Monocytes Relative: 8 %
Neutro Abs: 8 10*3/uL — ABNORMAL HIGH (ref 1.7–7.7)
Neutrophils Relative %: 65 %
Platelets: 405 10*3/uL — ABNORMAL HIGH (ref 150–400)
RBC: 4.79 MIL/uL (ref 4.22–5.81)
RDW: 13.2 % (ref 11.5–15.5)
WBC: 12.5 10*3/uL — ABNORMAL HIGH (ref 4.0–10.5)
nRBC: 0 % (ref 0.0–0.2)

## 2022-01-22 LAB — LIPASE, BLOOD: Lipase: 34 U/L (ref 11–51)

## 2022-01-22 MED ORDER — AZITHROMYCIN 250 MG PO TABS: 250.0000 mg | ORAL_TABLET | Freq: Every day | ORAL | 0 refills | Status: DC

## 2022-01-22 MED ORDER — ONDANSETRON HCL 4 MG/2ML IJ SOLN
4.0000 mg | Freq: Once | INTRAMUSCULAR | Status: AC
  Administered 2022-01-22: 4 mg via INTRAVENOUS
  Filled 2022-01-22: qty 2

## 2022-01-22 MED ORDER — IOHEXOL 300 MG/ML  SOLN
100.0000 mL | Freq: Once | INTRAMUSCULAR | Status: AC | PRN
  Administered 2022-01-22: 100 mL via INTRAVENOUS

## 2022-01-22 MED ORDER — SODIUM CHLORIDE 0.9 % IV BOLUS
1000.0000 mL | Freq: Once | INTRAVENOUS | Status: AC
  Administered 2022-01-22: 1000 mL via INTRAVENOUS

## 2022-01-22 MED ORDER — POTASSIUM CHLORIDE CRYS ER 20 MEQ PO TBCR
40.0000 meq | EXTENDED_RELEASE_TABLET | Freq: Once | ORAL | Status: AC
Start: 2022-01-22 — End: 2022-01-22
  Administered 2022-01-22: 40 meq via ORAL
  Filled 2022-01-22: qty 2

## 2022-01-22 MED ORDER — POTASSIUM CHLORIDE CRYS ER 20 MEQ PO TBCR: 40.0000 meq | EXTENDED_RELEASE_TABLET | Freq: Every day | ORAL | 0 refills | Status: DC

## 2022-01-22 MED ORDER — LOPERAMIDE HCL 2 MG PO CAPS: 2.0000 mg | ORAL_CAPSULE | Freq: Four times a day (QID) | ORAL | 0 refills | Status: DC | PRN

## 2022-01-22 NOTE — ED Provider Notes (Signed)
? ?Emergency Department Provider Note ? ? ?I have reviewed the triage vital signs and the nursing notes. ? ? ?HISTORY ? ?Chief Complaint ?Emesis and Diarrhea ? ? ?HPI ?Victor Johnson is a 46 y.o. male with PMH of HTN presents to the emergency department for reevaluation of nausea, vomiting and now with some more focal left lower quadrant abdominal pain.  No radiation of pain to the testicles.  He does drink alcohol and uses marijuana.  No fevers or chills.  He was seen 3 days ago in the emergency department for similar and had lab work at that time but no imaging.  Denies any chest pain or shortness of breath. ? ? ?Past Medical History:  ?Diagnosis Date  ? Gastritis   ? Hypertension   ? ? ?Review of Systems ? ?Constitutional: No fever/chills ?Eyes: No visual changes. ?ENT: No sore throat. ?Cardiovascular: Denies chest pain. ?Respiratory: Denies shortness of breath. ?Gastrointestinal: Positive LLQ abdominal pain.  Positive nausea and vomiting.  No diarrhea.  No constipation. ?Genitourinary: Negative for dysuria. ?Musculoskeletal: Negative for back pain. ?Skin: Negative for rash. ?Neurological: Negative for headaches. ? ? ?____________________________________________ ? ? ?PHYSICAL EXAM: ? ?VITAL SIGNS: ?ED Triage Vitals [01/22/22 1838]  ?Enc Vitals Group  ?   BP (!) 130/95  ?   Pulse Rate 100  ?   Resp 18  ?   Temp 98.3 ?F (36.8 ?C)  ?   Temp Source Oral  ?   SpO2 99 %  ?   Weight 150 lb (68 kg)  ?   Height 5\' 9"  (1.753 m)  ? ?Constitutional: Alert and oriented. Well appearing and in no acute distress. ?Eyes: Conjunctivae are normal.  ?Head: Atraumatic. ?Nose: No congestion/rhinnorhea. ?Mouth/Throat: Mucous membranes are moist.   ?Neck: No stridor.  ?Cardiovascular: Normal rate, regular rhythm. Good peripheral circulation. Grossly normal heart sounds.   ?Respiratory: Normal respiratory effort.  No retractions. Lungs CTAB. ?Gastrointestinal: Soft with mild LLQ tenderness. No peritonitis. No distention.   ?Musculoskeletal: No lower extremity tenderness nor edema. No gross deformities of extremities. ?Neurologic:  Normal speech and language. No gross focal neurologic deficits are appreciated.  ?Skin:  Skin is warm, dry and intact. No rash noted. ? ?____________________________________________ ?  ?LABS ?(all labs ordered are listed, but only abnormal results are displayed) ? ?Labs Reviewed  ?CBC WITH DIFFERENTIAL/PLATELET - Abnormal; Notable for the following components:  ?    Result Value  ? WBC 12.5 (*)   ? Platelets 405 (*)   ? Neutro Abs 8.0 (*)   ? All other components within normal limits  ?COMPREHENSIVE METABOLIC PANEL - Abnormal; Notable for the following components:  ? Potassium 3.0 (*)   ? Glucose, Bld 114 (*)   ? Total Protein 8.2 (*)   ? All other components within normal limits  ?URINALYSIS, ROUTINE W REFLEX MICROSCOPIC - Abnormal; Notable for the following components:  ? Specific Gravity, Urine 1.045 (*)   ? All other components within normal limits  ?LIPASE, BLOOD  ? ?____________________________________________ ? ?RADIOLOGY ? ?CT Abdomen Pelvis W Contrast ? ?Result Date: 01/22/2022 ?CLINICAL DATA:  Left lower quadrant pain. Nausea and vomiting. Abdominal pain and diarrhea. EXAM: CT ABDOMEN AND PELVIS WITH CONTRAST TECHNIQUE: Multidetector CT imaging of the abdomen and pelvis was performed using the standard protocol following bolus administration of intravenous contrast. RADIATION DOSE REDUCTION: This exam was performed according to the departmental dose-optimization program which includes automated exposure control, adjustment of the mA and/or kV according to patient size and/or use of  iterative reconstruction technique. CONTRAST:  OMNIPAQUE IOHEXOL 300 MG/ML  SOLN COMPARISON:  CT 10/03/2021 FINDINGS: Lower chest: Subpleural ground-glass in the right greater than left lobes likely hypoventilatory changes. No confluent airspace disease or pleural effusion. Hepatobiliary: No focal liver abnormality  is seen. No gallstones, gallbladder wall thickening, or biliary dilatation. Pancreas: Unremarkable. No pancreatic ductal dilatation or surrounding inflammatory changes. Spleen: Normal in size without focal abnormality. Adrenals/Urinary Tract: Normal adrenal glands. No hydronephrosis or perinephric edema. Homogeneous renal enhancement with symmetric excretion on delayed phase imaging. Urinary bladder is physiologically distended without wall thickening. Stomach/Bowel: Detailed bowel assessment is limited in the absence of enteric contrast. Wall thickening of the distal esophagus. Decompressed stomach not well assessed. There is no small bowel obstruction or inflammatory change. The appendix is normal. Fluid/liquid stool in the proximal colon. Suggestion of mild wall thickening involving the distal descending and sigmoid colon, but no pericolonic edema. Diverticulosis without focal diverticulitis. Vascular/Lymphatic: Normal caliber abdominal aorta. Patent portal and mesenteric veins. Small upper retroperitoneal lymph nodes, not enlarged by size criteria. No enlarged abdominopelvic nodes. Reproductive: Prostate is unremarkable. Other: No free air, free fluid, or intra-abdominal fluid collection. 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 There are no acute or suspicious osseous abnormalities. IMPRESSION: 1. Suggestion of mild wall thickening involving the distal descending and sigmoid colon, but no pericolonic edema. This may be due to nondistention or mild colitis. 2. Wall thickening of the distal esophagus, can be seen with reflux or esophagitis. 3. Diverticulosis without focal diverticulitis. Electronically Signed   By: Narda Rutherford M.D.   On: 01/22/2022 22:03   ? ?____________________________________________ ? ? ?PROCEDURES ? ?Procedure(s) performed:  ? ?Procedures ? ?None   ?____________________________________________ ? ? ?INITIAL IMPRESSION / ASSESSMENT AND PLAN / ED COURSE ? ?Pertinent labs & imaging results that were available during my care of the patient were reviewed by me and considered in my medical decision making (see chart for details). ?  ?This patient is Presenting for Evaluation of abdominal pain, which does require a range of treatment options, and is a complaint that involves a high risk of morbidity and mortality. ? ?The Differential Diagnoses includes but is not exclusive to acute appendicitis, renal colic, testicular torsion, urinary tract infection, prostatitis,  diverticulitis, small bowel obstruction, colitis, abdominal aortic aneurysm, gastroenteritis, constipation etc. ? ? ?Critical Interventions-  ?  ?Medications  ?sodium chloride 0.9 % bolus 1,000 mL (1,000 mLs Intravenous Bolus 01/22/22 2125)  ?ondansetron [facility]) injection 4 mg (4 mg Intravenous Given 01/22/22 2125)  ?iohexol (OMNIPAQUE) 300 MG/ML solution 100 mL (100 mLs Intravenous Contrast Given 01/22/22 2140)  ?potassium chloride SA (KLOR-CON M) CR tablet 40 mEq (40 mEq Oral Given 01/22/22 2230)  ? ? ?Reassessment after intervention:  Symptoms improved. ? ? ?I decided to review pertinent External Data, and in summary patient seen in the ED on 3/17. ?  ?Clinical Laboratory Tests Ordered, included patient with continued leukocytosis down to 12.5 up slightly.  No acute kidney injury.  Mild hypokalemia at 3.0. Lipase negative.  ? ?Radiologic Tests Ordered, included CT abdomen/pelvis. I independently interpreted the images and agree with radiology interpretation.  ? ?Cardiac Monitor Tracing which shows NSR. ? ? ?Social Determinants of Health Risk Positive EtOH and marijuana. ? ?Medical Decision Making: Summary:  ?Patient presents emergency department with left lower quadrant abdominal pain along with nausea and vomiting.  He returns for reevaluation.  Mild tenderness on exam and left lower quadrant.  Plan for  CT imaging for further evaluation.  ? ?Reevaluation with update and discussion with patient.  Discussed CT results along with labs.  Plan to supplement potassium.  Patient has nausea medication at home.  Will add medication for diarrhea.  Patient wi

## 2022-01-22 NOTE — ED Provider Triage Note (Signed)
Emergency Medicine Provider Triage Evaluation Note ? ?Victor Johnson , a 46 y.o. male  was evaluated in triage.  Pt complains of N/V and diarrhea for the last few days.  Was seen in ED, received some fluids and metoclopramide.  Noticed some improvement.  Yesterday symptoms restarted.  Abdominal pain localized to the LLQ.  Denies fever, urinary symptoms or flank pain.  Denies chest pain or shortness of breath. ? ?Review of Systems  ?Positive: As above ?Negative: As above ? ?Physical Exam  ?BP (!) 130/95 (BP Location: Left Arm)   Pulse 100   Temp 98.3 ?F (36.8 ?C) (Oral)   Resp 18   Ht 5\' 9"  (1.753 m)   Wt 68 kg   SpO2 99%   BMI 22.15 kg/m?  ?Gen:   Awake, no distress   ?Resp:  Normal effort  ?MSK:   Moves extremities without difficulty  ?Other:  LLQ tender to palpation. ? ?Medical Decision Making  ?Medically screening exam initiated at 7:22 PM.  Appropriate orders placed.  Chritopher Coster was informed that the remainder of the evaluation will be completed by another provider, this initial triage assessment does not replace that evaluation, and the importance of remaining in the ED until their evaluation is complete. ? ?Labs and imaging ordered ?  ?Jonnie Finner, PA-C ?01/22/22 1931 ? ?

## 2022-01-22 NOTE — Discharge Instructions (Addendum)

## 2022-01-22 NOTE — ED Triage Notes (Signed)
Pt arrived via POV, c/o n/v diarrhea and abd pain. States he was seen recently for same , felt better and now sx returned.  ?

## 2022-01-23 ENCOUNTER — Ambulatory Visit (INDEPENDENT_AMBULATORY_CARE_PROVIDER_SITE_OTHER): Payer: Self-pay | Admitting: *Deleted

## 2022-01-23 NOTE — Telephone Encounter (Signed)
? ?  Chief Complaint: Abdominal pain ?Symptoms: Seen in ED yesterday. Has not started antibiotics, other meds prescribed.States o his way to get them. Reports pain "A little better" and stools are now formed. Pt calling for GI referral as recommended in ED. ?Frequency: About 1 week prior to ED visit yesterday.  ?Pertinent Negatives: Patient denies fever ?Disposition: [] ED /[] Urgent Care (no appt availability in office) / [] Appointment(In office/virtual)/ []  [facility]/ [] Home Care/ [] Refused Recommended Disposition /[] [facility]/ [x]  Follow-up with PCP ?Additional Notes: Please advise. Pt in ED yesterday, 'Colitis' requests GI referral. Care advise given. ? ?Reason for Disposition ? [1] MODERATE pain (e.g., interferes with normal activities) AND [2] pain comes and goes (cramps) AND [3] present > 24 hours  (Exception: pain with Vomiting or Diarrhea - see that Guideline) ? ?Answer Assessment - Initial Assessment Questions ?1. LOCATION: "Where does it hurt?"  ?    Left side ?2. RADIATION: "Does the pain shoot anywhere else?" (e.g., chest, back) ?    *No Answer* ?3. ONSET: "When did the pain begin?" (Minutes, hours or days ago)  ?    Yesterday seen in ED, onset last Thursday ?4. SUDDEN: "Gradual or sudden onset?" ?     ?5. PATTERN "Does the pain come and go, or is it constant?" ?   - If constant: "Is it getting better, staying the same, or worsening?"  ?    (Note: Constant means the pain never goes away completely; most serious pain is constant and it progresses)  ?   - If intermittent: "How long does it last?" "Do you have pain now?" ?    (Note: Intermittent means the pain goes away completely between bouts) ?    Constant ?6. SEVERITY: "How bad is the pain?"  (e.g., Scale 1-10; mild, moderate, or severe) ?   - MILD (1-3): doesn't interfere with normal activities, abdomen soft and not tender to touch  ?   - MODERATE (4-7): interferes with normal activities or awakens from sleep, abdomen tender  to touch  ?   - SEVERE (8-10): excruciating pain, doubled over, unable to do any normal activities   ?    5/10 ?7. RECURRENT SYMPTOM: "Have you ever had this type of stomach pain before?" If Yes, ask: "When was the last time?" and "What happened that time?"  ?     ?8. CAUSE: "What do you think is causing the stomach pain?" ?     ?9. RELIEVING/AGGRAVATING FACTORS: "What makes it better or worse?" (e.g., movement, antacids, bowel movement) ?     ?10. OTHER SYMPTOMS: "Do you have any other symptoms?" (e.g., back pain, diarrhea, fever, urination pain, vomiting) ?      No ? ?Protocols used: Abdominal Pain - Male-A-AH ? ?

## 2022-01-24 NOTE — Telephone Encounter (Signed)
Left message per DPR informing patient that referral to GI was placed while in hospital. GI office should contact him for appointment. Advised to contact our office with any questions or concerns.  ?

## 2022-02-19 ENCOUNTER — Encounter: Payer: Self-pay | Admitting: Family Medicine

## 2022-02-21 ENCOUNTER — Encounter: Payer: Self-pay | Admitting: Gastroenterology

## 2022-03-15 ENCOUNTER — Ambulatory Visit: Payer: Commercial Managed Care - HMO | Admitting: Gastroenterology

## 2022-04-11 ENCOUNTER — Ambulatory Visit: Payer: Commercial Managed Care - HMO | Admitting: Gastroenterology

## 2022-07-08 ENCOUNTER — Other Ambulatory Visit: Payer: Self-pay

## 2022-07-08 ENCOUNTER — Observation Stay (HOSPITAL_COMMUNITY): Payer: Self-pay

## 2022-07-08 ENCOUNTER — Inpatient Hospital Stay (HOSPITAL_COMMUNITY)
Admission: EM | Admit: 2022-07-08 | Discharge: 2022-07-11 | DRG: 684 | Disposition: A | Discharge status: Home / Self Care | Payer: Self-pay | Attending: Internal Medicine | Admitting: Internal Medicine

## 2022-07-08 ENCOUNTER — Encounter (HOSPITAL_COMMUNITY): Payer: Self-pay | Admitting: Emergency Medicine

## 2022-07-08 DIAGNOSIS — R1115 Cyclical vomiting syndrome unrelated to migraine: Secondary | ICD-10-CM | POA: Diagnosis present

## 2022-07-08 DIAGNOSIS — Z79899 Other long term (current) drug therapy: Secondary | ICD-10-CM

## 2022-07-08 DIAGNOSIS — F1290 Cannabis use, unspecified, uncomplicated: Secondary | ICD-10-CM | POA: Diagnosis present

## 2022-07-08 DIAGNOSIS — Z20822 Contact with and (suspected) exposure to covid-19: Secondary | ICD-10-CM | POA: Diagnosis present

## 2022-07-08 DIAGNOSIS — N179 Acute kidney failure, unspecified: Principal | ICD-10-CM | POA: Diagnosis present

## 2022-07-08 DIAGNOSIS — I10 Essential (primary) hypertension: Secondary | ICD-10-CM | POA: Diagnosis present

## 2022-07-08 DIAGNOSIS — K5730 Diverticulosis of large intestine without perforation or abscess without bleeding: Secondary | ICD-10-CM | POA: Diagnosis present

## 2022-07-08 DIAGNOSIS — E876 Hypokalemia: Secondary | ICD-10-CM | POA: Diagnosis present

## 2022-07-08 DIAGNOSIS — R109 Unspecified abdominal pain: Secondary | ICD-10-CM

## 2022-07-08 DIAGNOSIS — K589 Irritable bowel syndrome without diarrhea: Secondary | ICD-10-CM | POA: Diagnosis present

## 2022-07-08 DIAGNOSIS — F17210 Nicotine dependence, cigarettes, uncomplicated: Secondary | ICD-10-CM | POA: Diagnosis present

## 2022-07-08 DIAGNOSIS — R112 Nausea with vomiting, unspecified: Secondary | ICD-10-CM | POA: Diagnosis present

## 2022-07-08 DIAGNOSIS — D72829 Elevated white blood cell count, unspecified: Secondary | ICD-10-CM | POA: Diagnosis present

## 2022-07-08 LAB — COMPREHENSIVE METABOLIC PANEL
ALT: 22 U/L (ref 0–44)
AST: 22 U/L (ref 15–41)
Albumin: 5 g/dL (ref 3.5–5.0)
Alkaline Phosphatase: 66 U/L (ref 38–126)
Anion gap: 16 — ABNORMAL HIGH (ref 5–15)
BUN: 29 mg/dL — ABNORMAL HIGH (ref 6–20)
CO2: 28 mmol/L (ref 22–32)
Calcium: 9.8 mg/dL (ref 8.9–10.3)
Chloride: 94 mmol/L — ABNORMAL LOW (ref 98–111)
Creatinine, Ser: 3.29 mg/dL — ABNORMAL HIGH (ref 0.61–1.24)
GFR, Estimated: 23 mL/min — ABNORMAL LOW (ref >60)
Glucose, Bld: 115 mg/dL — ABNORMAL HIGH (ref 70–99)
Potassium: 3.4 mmol/L — ABNORMAL LOW (ref 3.5–5.1)
Sodium: 138 mmol/L (ref 135–145)
Total Bilirubin: 1.6 mg/dL — ABNORMAL HIGH (ref 0.3–1.2)
Total Protein: 8.6 g/dL — ABNORMAL HIGH (ref 6.5–8.1)

## 2022-07-08 LAB — MAGNESIUM: Magnesium: 2.3 mg/dL (ref 1.7–2.4)

## 2022-07-08 LAB — CBC
HCT: 43.9 % (ref 39.0–52.0)
Hemoglobin: 15.3 g/dL (ref 13.0–17.0)
MCH: 32.4 pg (ref 26.0–34.0)
MCHC: 34.9 g/dL (ref 30.0–36.0)
MCV: 93 fL (ref 80.0–100.0)
Platelets: 368 10*3/uL (ref 150–400)
RBC: 4.72 MIL/uL (ref 4.22–5.81)
RDW: 12.9 % (ref 11.5–15.5)
WBC: 16.6 10*3/uL — ABNORMAL HIGH (ref 4.0–10.5)
nRBC: 0 % (ref 0.0–0.2)

## 2022-07-08 LAB — URINALYSIS, ROUTINE W REFLEX MICROSCOPIC
Bacteria, UA: NONE SEEN
Bilirubin Urine: NEGATIVE
Glucose, UA: NEGATIVE mg/dL
Ketones, ur: NEGATIVE mg/dL
Leukocytes,Ua: NEGATIVE
Nitrite: NEGATIVE
Protein, ur: 100 mg/dL — AB
Specific Gravity, Urine: 1.027 (ref 1.005–1.030)
pH: 5 (ref 5.0–8.0)

## 2022-07-08 LAB — RESP PANEL BY RT-PCR (FLU A&B, COVID) ARPGX2
Influenza A by PCR: NEGATIVE
Influenza B by PCR: NEGATIVE
SARS Coronavirus 2 by RT PCR: NEGATIVE

## 2022-07-08 LAB — CK: Total CK: 602 U/L — ABNORMAL HIGH (ref 49–397)

## 2022-07-08 LAB — CBG MONITORING, ED
Glucose-Capillary: 128 mg/dL — ABNORMAL HIGH (ref 70–99)
Glucose-Capillary: 123 mg/dL — ABNORMAL HIGH (ref 70–99)

## 2022-07-08 LAB — LIPASE, BLOOD: Lipase: 34 U/L (ref 11–51)

## 2022-07-08 MED ORDER — POTASSIUM CHLORIDE CRYS ER 20 MEQ PO TBCR
40.0000 meq | EXTENDED_RELEASE_TABLET | Freq: Every day | ORAL | Status: DC
  Filled 2022-07-08: qty 2

## 2022-07-08 MED ORDER — SODIUM CHLORIDE 0.9 % IV SOLN: INTRAVENOUS | Status: DC

## 2022-07-08 MED ORDER — ONDANSETRON HCL 4 MG/2ML IJ SOLN
4.0000 mg | Freq: Once | INTRAMUSCULAR | Status: AC
  Administered 2022-07-08: 4 mg via INTRAVENOUS
  Filled 2022-07-08: qty 2

## 2022-07-08 MED ORDER — ONDANSETRON HCL 4 MG/2ML IJ SOLN
4.0000 mg | Freq: Four times a day (QID) | INTRAMUSCULAR | Status: DC | PRN
  Administered 2022-07-08 – 2022-07-10 (×3): 4 mg via INTRAVENOUS
  Filled 2022-07-08 (×3): qty 2

## 2022-07-08 MED ORDER — DICYCLOMINE HCL 20 MG PO TABS
20.0000 mg | ORAL_TABLET | Freq: Three times a day (TID) | ORAL | Status: DC
  Administered 2022-07-09: 20 mg via ORAL
  Filled 2022-07-08 (×2): qty 1

## 2022-07-08 MED ORDER — DICYCLOMINE HCL 10 MG PO CAPS
20.0000 mg | ORAL_CAPSULE | Freq: Once | ORAL | Status: AC
  Administered 2022-07-08: 20 mg via ORAL
  Filled 2022-07-08: qty 2

## 2022-07-08 MED ORDER — SODIUM CHLORIDE 0.9 % IV BOLUS
1000.0000 mL | Freq: Once | INTRAVENOUS | Status: AC
  Administered 2022-07-08: 1000 mL via INTRAVENOUS

## 2022-07-08 MED ORDER — ACETAMINOPHEN 650 MG RE SUPP: 650.0000 mg | Freq: Four times a day (QID) | RECTAL | Status: DC | PRN

## 2022-07-08 MED ORDER — PANTOPRAZOLE SODIUM 40 MG PO TBEC
40.0000 mg | DELAYED_RELEASE_TABLET | Freq: Every day | ORAL | Status: DC
  Filled 2022-07-08: qty 1

## 2022-07-08 MED ORDER — ACETAMINOPHEN 325 MG PO TABS: 650.0000 mg | ORAL_TABLET | Freq: Four times a day (QID) | ORAL | Status: DC | PRN

## 2022-07-08 MED ORDER — AMLODIPINE BESYLATE 10 MG PO TABS
10.0000 mg | ORAL_TABLET | Freq: Every day | ORAL | Status: DC
  Administered 2022-07-10 – 2022-07-11 (×2): 10 mg via ORAL
  Filled 2022-07-08 (×3): qty 1

## 2022-07-08 MED ORDER — ONDANSETRON HCL 4 MG PO TABS: 4.0000 mg | ORAL_TABLET | Freq: Four times a day (QID) | ORAL | Status: DC | PRN

## 2022-07-08 MED ORDER — SODIUM CHLORIDE 0.9 % IV SOLN
12.5000 mg | Freq: Four times a day (QID) | INTRAVENOUS | Status: DC | PRN
  Administered 2022-07-08 – 2022-07-09 (×3): 12.5 mg via INTRAVENOUS
  Filled 2022-07-08: qty 12.5
  Filled 2022-07-08 (×2): qty 0.5
  Filled 2022-07-08: qty 12.5

## 2022-07-08 NOTE — H&P (Signed)
History and Physical    Patient: Victor Johnson:[id number] DOB: October 23, 1976 DOA: 07/08/2022 DOS: the patient was seen and examined on 07/08/2022 PCP: Kerin Perna, NP  Patient coming from: Home  Chief Complaint:  Chief Complaint  Patient presents with   Abdominal Pain   HPI: Victor Johnson is a 46 y.o. male with medical history significant of HTN. Presenting w/ abdominal pain. He reports that he woke up with N/V yesterday morning. He didn't have a change in diet. He didn't have any sick contacts. There was not any fever. This continued through the day. This morning he had some abdominal cramping His wife thought he might need a muscle relaxer, so she was going to give him one of hers. However, she mistakenly gave him her DM meds. After that, his symptoms worsened. He then decided to come to the ED for evaluation. He denies any other aggravating or alleviating factors.   Review of Systems: As mentioned in the history of present illness. All other systems reviewed and are negative. Past Medical History:  Diagnosis Date   Gastritis    Hypertension    PSHx History reviewed. No pertinent surgical history.  Social History:  reports that he has been smoking cigarettes. He has been smoking an average of 1 pack per day. He has never used smokeless tobacco. He reports current alcohol use. He reports current drug use. Drug: Marijuana.  No Known Allergies  FamHx History reviewed. No pertinent family history.  Prior to Admission medications   Medication Sig Start Date End Date Taking? Authorizing Provider  amLODipine (NORVASC) 10 MG tablet Take 1 tablet (10 mg total) by mouth daily. 08/31/21  Yes Kerin Perna, NP  cyclobenzaprine (FLEXERIL) 10 MG tablet Take 1 tablet (10 mg total) by mouth 2 (two) times daily as needed for muscle spasms. 08/31/21  Yes Kerin Perna, NP  hydrochlorothiazide (HYDRODIURIL) 25 MG tablet Take 1 tablet (25 mg total) by mouth daily. 08/31/21 07/08/22  Yes Kerin Perna, NP  azithromycin (ZITHROMAX) 250 MG tablet Take 1 tablet (250 mg total) by mouth daily. Take first 2 tablets together, then 1 every day until finished. 01/22/22   Long, Wonda Olds, MD  loperamide (IMODIUM) 2 MG capsule Take 1 capsule (2 mg total) by mouth 4 (four) times daily as needed for diarrhea or loose stools. 01/22/22   Long, Wonda Olds, MD  potassium chloride SA (KLOR-CON M) 20 MEQ tablet Take 2 tablets (40 mEq total) by mouth daily for 4 days. 01/22/22 01/26/22  Long, Wonda Olds, MD  prochlorperazine (COMPAZINE) 10 MG tablet Take 1 tablet (10 mg total) by mouth 2 (two) times daily as needed for nausea or vomiting. 01/19/22   Cardama, Grayce Sessions, MD  promethazine (PHENERGAN) 25 MG tablet Take 1 tablet (25 mg total) by mouth every 6 (six) hours as needed for nausea or vomiting. 10/04/21   Merryl Hacker, MD    Physical Exam: Vitals:   07/08/22 1350 07/08/22 1415 07/08/22 1530 07/08/22 1645  BP: (!) 138/99 120/87 106/73 120/78  Pulse: 97 99 93 91  Resp: 16  16 16   Temp: 98.4 F (36.9 C)     TempSrc: Oral     SpO2: 100% 100% 100% 100%   General: 46 y.o. male resting in bed in NAD Eyes: PERRL, normal sclera ENMT: Nares patent w/o discharge, orophaynx clear, dentition normal, ears w/o discharge/lesions/ulcers Neck: Supple, trachea midline Cardiovascular: RRR, +S1, S2, no m/g/r, equal pulses throughout Respiratory: CTABL, no w/r/r, normal WOB  GI: BS+, ND, mild global TTP, no masses noted, no organomegaly noted MSK: No e/c/c Neuro: A&O x 3, no focal deficits Psyc: Appropriate interaction and affect, calm/cooperative  Data Reviewed: Lab Results  Component Value Date   NA 138 07/08/2022   K 3.4 (L) 07/08/2022   CO2 28 07/08/2022   GLUCOSE 115 (H) 07/08/2022   BUN 29 (H) 07/08/2022   CREATININE 3.29 (H) 07/08/2022   CALCIUM 9.8 07/08/2022   EGFR 112 07/17/2021   GFRNONAA 23 (L) 07/08/2022   Lab Results  Component Value Date   WBC 16.6 (H) 07/08/2022    HGB 15.3 07/08/2022   HCT 43.9 07/08/2022   MCV 93.0 07/08/2022   PLT 368 07/08/2022    Assessment and Plan: AKI     - place in obs, med-surg     - continue fluids     - check renal US     - watch nephrotoxins  N/V Abdominal pain     - check KUB     - has responded well to zofran and bentyl; continue  Hypokalemia     - replace K+; check Mg2+  HTN     - hold home HCTZ     - continue home norvasc  Advance Care Planning:   Code Status: FULL  Consults: None  Family Communication: None at bedside  Severity of Illness: The appropriate patient status for this patient is INPATIENT. Inpatient status is judged to be reasonable and necessary in order to provide the required intensity of service to ensure the patient's safety. The patient's presenting symptoms, physical exam findings, and initial radiographic and laboratory data in the context of their chronic comorbidities is felt to place them at high risk for further clinical deterioration. Furthermore, it is not anticipated that the patient will be medically stable for discharge from the hospital within 2 midnights of admission.   * I certify that at the point of admission it is my clinical judgment that the patient will require inpatient hospital care spanning beyond 2 midnights from the point of admission due to high intensity of service, high risk for further deterioration and high frequency of surveillance required.*  Time spent in coordination of this H&P: 35 minutes  Author: Jonnie Finner, DO 07/08/2022 4:49 PM  For on call review [email or web address].

## 2022-07-08 NOTE — ED Provider Notes (Signed)
[facility] DEPT Provider Note   CSN: [id number] Arrival date & time: 07/08/22  1334     History  Chief Complaint  Patient presents with   Abdominal Pain    Andrewjoseph Forrister is a 46 y.o. male.  Caolan Joynt is a 46 y.o. male with a history of hypertension and gastritis, who presents to the ED for evaluation of body aches and spasms, abdominal cramping, nausea and vomiting.  Patient has had no fevers or chills.  Reports some nasal congestion and rhinorrhea and a mild occasional cough.  No chest pain or shortness of breath.  No known sick contacts.  Has not recently eaten out no one else with similar symptoms.  No sick contacts.  Patient reports that the muscle cramping started last night his wife was going to give him a muscle relaxer but accidentally gave him a dose of her diabetes medication which he thinks was glipizide.  He reports after taking this medication he started having worsening stomach cramping.  No vomiting since taking Zofran last night.  The history is provided by the patient and medical records.  Abdominal Pain Associated symptoms: chills, cough, nausea and vomiting   Associated symptoms: no chest pain, no diarrhea, no dysuria, no fever, no shortness of breath and no sore throat        Home Medications Prior to Admission medications   Medication Sig Start Date End Date Taking? Authorizing Provider  amLODipine (NORVASC) 10 MG tablet Take 1 tablet (10 mg total) by mouth daily. 08/31/21   Kerin Perna, NP  azithromycin (ZITHROMAX) 250 MG tablet Take 1 tablet (250 mg total) by mouth daily. Take first 2 tablets together, then 1 every day until finished. 01/22/22   Long, Wonda Olds, MD  cyclobenzaprine (FLEXERIL) 10 MG tablet Take 1 tablet (10 mg total) by mouth 2 (two) times daily as needed for muscle spasms. 08/31/21   Kerin Perna, NP  hydrochlorothiazide (HYDRODIURIL) 25 MG tablet Take 1 tablet (25 mg total) by mouth daily.  08/31/21 09/30/21  Kerin Perna, NP  loperamide (IMODIUM) 2 MG capsule Take 1 capsule (2 mg total) by mouth 4 (four) times daily as needed for diarrhea or loose stools. 01/22/22   Long, Wonda Olds, MD  potassium chloride SA (KLOR-CON M) 20 MEQ tablet Take 2 tablets (40 mEq total) by mouth daily for 4 days. 01/22/22 01/26/22  Long, Wonda Olds, MD  prochlorperazine (COMPAZINE) 10 MG tablet Take 1 tablet (10 mg total) by mouth 2 (two) times daily as needed for nausea or vomiting. 01/19/22   Cardama, Grayce Sessions, MD  promethazine (PHENERGAN) 25 MG tablet Take 1 tablet (25 mg total) by mouth every 6 (six) hours as needed for nausea or vomiting. 10/04/21   Horton, Barbette Hair, MD      Allergies    Patient has no known allergies.    Review of Systems   Review of Systems  Constitutional:  Positive for chills. Negative for fever.  HENT:  Positive for congestion and rhinorrhea. Negative for sore throat.   Respiratory:  Positive for cough. Negative for shortness of breath.   Cardiovascular:  Negative for chest pain.  Gastrointestinal:  Positive for abdominal pain, nausea and vomiting. Negative for diarrhea.  Genitourinary:  Negative for dysuria.  Musculoskeletal:  Positive for myalgias. Negative for arthralgias.  Neurological:  Negative for headaches.    Physical Exam Updated Vital Signs BP (!) 138/99   Pulse 97   Temp 98.4 F (36.9 C) (Oral)  Resp 16   SpO2 100%  Physical Exam Vitals and nursing note reviewed.  Constitutional:      General: He is not in acute distress.    Appearance: He is well-developed and normal weight. He is not ill-appearing or diaphoretic.  HENT:     Head: Normocephalic and atraumatic.     Nose: Rhinorrhea present.     Mouth/Throat:     Mouth: Mucous membranes are moist.     Pharynx: Oropharynx is clear.  Eyes:     General:        Right eye: No discharge.        Left eye: No discharge.  Neck:     Comments: No rigidity Cardiovascular:     Rate and Rhythm:  Normal rate and regular rhythm.     Heart sounds: Normal heart sounds. No murmur heard.    No friction rub. No gallop.  Pulmonary:     Effort: Pulmonary effort is normal. No respiratory distress.     Breath sounds: Normal breath sounds.     Comments: Respirations equal and unlabored, patient able to speak in full sentences, lungs clear to auscultation bilaterally  Abdominal:     General: Bowel sounds are normal. There is no distension.     Palpations: Abdomen is soft. There is no mass.     Tenderness: There is abdominal tenderness. There is no guarding.     Comments: Abdomen soft, nondistended, there is mild generalized abdominal tenderness that does not localize to 1 quadrant, no guarding or peritoneal signs.  Musculoskeletal:        General: No deformity.     Cervical back: Neck supple.  Lymphadenopathy:     Cervical: No cervical adenopathy.  Skin:    General: Skin is warm and dry.     Capillary Refill: Capillary refill takes less than 2 seconds.  Neurological:     Mental Status: He is alert and oriented to person, place, and time.     Comments: Speech is clear, able to follow commands Moves extremities without ataxia, coordination intact  Psychiatric:        Mood and Affect: Mood normal.        Behavior: Behavior normal.     ED Results / Procedures / Treatments   Labs (all labs ordered are listed, but only abnormal results are displayed) Labs Reviewed  CBC - Abnormal; Notable for the following components:      Result Value   WBC 16.6 (*)    All other components within normal limits  URINALYSIS, ROUTINE W REFLEX MICROSCOPIC - Abnormal; Notable for the following components:   Color, Urine AMBER (*)    APPearance HAZY (*)    Hgb urine dipstick SMALL (*)    Protein, ur 100 (*)    All other components within normal limits  CBG MONITORING, ED - Abnormal; Notable for the following components:   Glucose-Capillary 128 (*)    All other components within normal limits  RESP  PANEL BY RT-PCR (FLU A&B, COVID) [id number]  LIPASE, BLOOD  COMPREHENSIVE METABOLIC PANEL  CK    EKG None  Radiology No results found.  Procedures Procedures    Medications Ordered in ED Medications  sodium chloride 0.9 % bolus 1,000 mL (1,000 mLs Intravenous New Bag/Given 07/08/22 1510)  ondansetron (ZOFRAN) injection 4 mg (4 mg Intravenous Given 07/08/22 1509)  dicyclomine (BENTYL) capsule 20 mg (20 mg Oral Given 07/08/22 1509)    ED Course/ Medical Decision Making/ A&P  Medical Decision Making Amount and/or Complexity of Data Reviewed Labs: ordered.   46 y.o. male presents to the ED with complaints of body aches, abdominal cramping, nausea, this involves an extensive number of treatment options, and is a complaint that carries with it a high risk of complications and morbidity.  The differential diagnosis includes viral syndrome, gastroenteritis, rhabdomyolysis, medication induced abdominal cramping after unintentional ingestion of glipizide, hypoglycemia   On arrival pt is nontoxic, vitals significant for mild hypertension, otherwise normal. Exam significant for some mild generalized abdominal tenderness, no focal pain or guarding.  Additional history obtained from chart review. Previous records obtained and reviewed   I ordered medication including IV fluid bolus, Zofran, Bentyl for symptomatic relief  Lab Tests:  I Ordered, reviewed, and interpreted labs, which included: UA without signs of infection, despite unintentional ingestion of glipizide fortunately patient is not hypoglycemic, patient does have leukocytosis of 16.6, but no fever, abdominal exam overall reassuring, this may be in the setting of vomiting last night.  The rest of patient's lab work is pending  Imaging Studies ordered:  Considered abdominal imaging but given reassuring exam do not feel this is indicated at this time.  ED Course:   Highest clinical suspicion for viral syndrome.,   Symptoms likely worsened by ingesting his wife's glipizide but fortunately no hypoglycemia, this was taken last night so at this time I feel the risk for complications from this medicine is overall low.  Given symptomatic treatment and at shift change care signed out to PA Luther Hearing who will follow up on pending lab work and disposition appropriately as long as rest of patient's labs are reassuring anticipate discharge home with symptomatic treatment.   Portions of this note were generated with Scientist, clinical (histocompatibility and immunogenetics). Dictation errors may occur despite best attempts at proofreading.         Final Clinical Impression(s) / ED Diagnoses Final diagnoses:  Abdominal cramping  Nausea and vomiting, unspecified vomiting type    Rx / DC Orders ED Discharge Orders     None         Dartha Lodge, [place] 07/08/22 1516    Arby Barrette, MD 07/08/22 1627

## 2022-07-08 NOTE — ED Provider Notes (Signed)
Accepted handoff at shift change from [facility]. Please see prior provider note for more detail.   Briefly: Patient is 46 y.o.   DDX: concern for Abdominal cramping, nausea, vomiting. Non-focal pain. Resolved since zofran at home. Generalized muscle aches and cramping. Mild cough / congestion. Well appearing on exam. Was going to get muscle relaxant from wife but accidentally took glipizide. Pending labs and reassessment after bentyl, zofran, bolus.   Plan: Reviewed lab work including CMP notable for elevated BUN and creatinine, creatinine 3.29, BUN of 29 from baseline around 1.17.  Male elevated total bilirubin today, 1.6.  RVP negative for COVID, flu.  Urinalysis has some protein but no significant ketones.  CBC is notable for moderate leukocytosis, white blood cells 16.6, although patient does not have significant signs of acute infectious process although there may be some component of infectious process given his significant creatinine elevation, nausea, vomiting, diarrhea, suspicious for gastritis, gastroenteritis versus other.  CK is minimally elevated at 62 but patient is not overall representative of an acute rhabdomyolysis.  I requested consultation with the hospitalist and spoke with Dr. Ronaldo Miyamoto, and reviewed lab work and imaging relevant to this patient's case, and after discussion of relevant data I recommended admission at this time.  In the meantime we will continue fluid rehydration for AKI and reevaluate the patient for response to treatment during his hospitalization.   RISR  EDTHIS    [place] 07/08/22 1719    Lorre Nick, MD 07/09/22 [phone]

## 2022-07-08 NOTE — ED Triage Notes (Signed)
Pt reports abd cramping that began yesterday. Pt reports N/V as well.

## 2022-07-08 NOTE — ED Provider Notes (Signed)
I provided a substantive portion of the care of this patient.  I personally performed the entirety of the medical decision making for this encounter.      Patient here with nausea vomiting diarrhea.  Has evidence of acute kidney injury.  Given IV fluids here and willing to be admitted to the hospital   Lorre Nick, MD 07/08/22 [phone]

## 2022-07-09 ENCOUNTER — Observation Stay (HOSPITAL_COMMUNITY): Payer: Self-pay

## 2022-07-09 ENCOUNTER — Inpatient Hospital Stay (HOSPITAL_COMMUNITY): Payer: Self-pay

## 2022-07-09 DIAGNOSIS — R112 Nausea with vomiting, unspecified: Secondary | ICD-10-CM | POA: Diagnosis present

## 2022-07-09 LAB — CBC
HCT: 41.1 % (ref 39.0–52.0)
Hemoglobin: 14.1 g/dL (ref 13.0–17.0)
MCH: 32.5 pg (ref 26.0–34.0)
MCHC: 34.3 g/dL (ref 30.0–36.0)
MCV: 94.7 fL (ref 80.0–100.0)
Platelets: 335 10*3/uL (ref 150–400)
RBC: 4.34 MIL/uL (ref 4.22–5.81)
RDW: 12.7 % (ref 11.5–15.5)
WBC: 14.5 10*3/uL — ABNORMAL HIGH (ref 4.0–10.5)
nRBC: 0 % (ref 0.0–0.2)

## 2022-07-09 LAB — COMPREHENSIVE METABOLIC PANEL
ALT: 18 U/L (ref 0–44)
AST: 22 U/L (ref 15–41)
Albumin: 4.1 g/dL (ref 3.5–5.0)
Alkaline Phosphatase: 56 U/L (ref 38–126)
Anion gap: 9 (ref 5–15)
BUN: 19 mg/dL (ref 6–20)
CO2: 28 mmol/L (ref 22–32)
Calcium: 9.2 mg/dL (ref 8.9–10.3)
Chloride: 102 mmol/L (ref 98–111)
Creatinine, Ser: 1.38 mg/dL — ABNORMAL HIGH (ref 0.61–1.24)
GFR, Estimated: >60 mL/min (ref >60)
Glucose, Bld: 113 mg/dL — ABNORMAL HIGH (ref 70–99)
Potassium: 3.5 mmol/L (ref 3.5–5.1)
Sodium: 139 mmol/L (ref 135–145)
Total Bilirubin: 1.8 mg/dL — ABNORMAL HIGH (ref 0.3–1.2)
Total Protein: 7.4 g/dL (ref 6.5–8.1)

## 2022-07-09 LAB — HIV ANTIBODY (ROUTINE TESTING W REFLEX): HIV Screen 4th Generation wRfx: NONREACTIVE

## 2022-07-09 MED ORDER — POTASSIUM CHLORIDE 10 MEQ/100ML IV SOLN
10.0000 meq | INTRAVENOUS | Status: AC
  Administered 2022-07-09 (×2): 10 meq via INTRAVENOUS
  Filled 2022-07-09: qty 100

## 2022-07-09 MED ORDER — IOHEXOL 9 MG/ML PO SOLN
500.0000 mL | ORAL | Status: AC
  Administered 2022-07-09 (×2): 500 mL via ORAL

## 2022-07-09 MED ORDER — PANTOPRAZOLE SODIUM 40 MG IV SOLR
40.0000 mg | Freq: Two times a day (BID) | INTRAVENOUS | Status: DC
  Administered 2022-07-09 – 2022-07-11 (×5): 40 mg via INTRAVENOUS
  Filled 2022-07-09 (×5): qty 10

## 2022-07-09 NOTE — Progress Notes (Signed)
PROGRESS NOTE  Victor Johnson [id number] DOB: 13-Aug-1976 DOA: 07/08/2022 PCP: Grayce Sessions, NP   LOS: 0 days   Brief Narrative / Interim history: 46 year old male with HTN, presents with abdominal pain, nausea and vomiting the day prior to admission.  No apparent triggers, no changes in his diet, no sick contacts.  Denies any fevers.  He also has been having significant abdominal cramping.  Given persistent symptoms came back to the hospital.  Significant events: 9/3-admission  Significant imaging / results / micro data: Abdominal x-ray 9/3-negative Renal ultrasound 9/3-normal renal sonogram Right upper quadrant ultrasound 9/4-no abnormalities, no gallstones, no wall thickening, CBD 3 mm  Subjective / 24h Interval events: He was feeling better this morning, his diet was advanced to soft however upon eating breakfast started experiencing recurrent abdominal pain, nausea and vomiting.  Assesement and Plan: Principal Problem:   AKI (acute kidney injury) (HCC) Active Problems:   Nausea and vomiting   Abdominal pain   Hypokalemia   HTN (hypertension)  Principal problem Acute kidney injury-creatinine on admission 3.29, likely in the setting of poor p.o. intake due to nausea, vomiting.  Creatinine improved to 1.3 this morning, still unable to take any significant p.o. intake and has persistent GI symptoms, continue IV fluids.  Renal ultrasound unremarkable  Active problems Nausea, vomiting, abdominal pain-this has been a recurrent problem for quite some time, he gets these episodes intermittently.  So far imaging has been unremarkable, however on chart review, he had a CT scan back in March 2023 that showed mild wall thickening in the distal descending and sigmoid colon, also some wall thickening of the distal esophagus.  Currently his pain is mainly bilateral lower quadrants.  Repeat CT scan and continue to monitor.  If he has persistent colonic or esophageal findings, along with  persistent symptoms may need GI consult tomorrow -He does have EtOH and THC use, which may contribute, but rule out organic causes first  Leukocytosis-likely reactive, improving  Hypokalemia-continue potassium supplements with goal of potassium of 4.0  Essential hypertension-normotensive, continue to hold home medications  Elevated bilirubin-unclear significance now, right upper quadrant ultrasound unremarkable.  He has had elevated T. bili in the past  Scheduled Meds:  amLODipine  10 mg Oral Daily   pantoprazole (PROTONIX) IV  40 mg Intravenous Q12H   Continuous Infusions:  sodium chloride 125 mL/hr at 07/09/22 0749   potassium chloride     promethazine (PHENERGAN) injection (IM or IVPB) 12.5 mg (07/09/22 1104)   PRN Meds:.acetaminophen **OR** acetaminophen, ondansetron **OR** ondansetron (ZOFRAN) IV, promethazine (PHENERGAN) injection (IM or IVPB)  Current Outpatient Medications  Medication Instructions   amLODipine (NORVASC) 10 mg, Oral, Daily   cyclobenzaprine (FLEXERIL) 10 mg, Oral, 2 times daily PRN   hydrochlorothiazide (HYDRODIURIL) 25 mg, Oral, Daily   potassium chloride SA (KLOR-CON M) 20 MEQ tablet 40 mEq, Oral, Daily    Diet Orders (From admission, onward)     Start     Ordered   07/09/22 0749  DIET SOFT Room service appropriate? Yes; Fluid consistency: Thin  Diet effective now       Question Answer Comment  Room service appropriate? Yes   Fluid consistency: Thin      07/09/22 0748            DVT prophylaxis: SCDs Start: 07/08/22 2129   Lab Results  Component Value Date   PLT 335 07/09/2022      Code Status: Full Code  Family Communication: Wife at the bedside  Status  is: Observation The patient will require care spanning > 2 midnights and should be moved to inpatient because: Persistent symptoms   Level of care: Med-Surg  Consultants:  None  Objective: Vitals:   07/08/22 2109 07/09/22 0200 07/09/22 0632 07/09/22 1023  BP: (!) 148/95  (!) 122/97 (!) 138/90 128/84  Pulse: 92 88 81 69  Resp: 18 18 15 18   Temp: 99.2 F (37.3 C) 98.7 F (37.1 C) 98.7 F (37.1 C) 98.5 F (36.9 C)  TempSrc: Oral Oral Oral Oral  SpO2: 100% 100% 100% 100%    Intake/Output Summary (Last 24 hours) at 07/09/2022 1145 Last data filed at 07/09/2022 1000 Gross per 24 hour  Intake 1281.74 ml  Output 0 ml  Net 1281.74 ml   Wt Readings from Last 3 Encounters:  01/22/22 68 kg  01/18/22 68 kg  10/03/21 65.8 kg    Examination:  Constitutional: NAD Eyes: no scleral icterus ENMT: Mucous membranes are moist.  Neck: normal, supple Respiratory: clear to auscultation bilaterally, no wheezing, no crackles.  Cardiovascular: Regular rate and rhythm, no murmurs / rubs / gallops. No LE edema.  Abdomen: Tenderness to palpation bilateral lower quadrants, no guarding or rebound.  Bowel sounds positive Musculoskeletal: no clubbing / cyanosis.  Skin: no rashes Neurologic: non focal   Data Reviewed: I have independently reviewed following labs and imaging studies   CBC Recent Labs  Lab 07/08/22 1451 07/09/22 0347  WBC 16.6* 14.5*  HGB 15.3 14.1  HCT 43.9 41.1  PLT 368 335  MCV 93.0 94.7  MCH 32.4 32.5  MCHC 34.9 34.3  RDW 12.9 12.7    Recent Labs  Lab 07/08/22 1451 07/09/22 0347  NA 138 139  K 3.4* 3.5  CL 94* 102  CO2 28 28  GLUCOSE 115* 113*  BUN 29* 19  CREATININE 3.29* 1.38*  CALCIUM 9.8 9.2  AST 22 22  ALT 22 18  ALKPHOS 66 56  BILITOT 1.6* 1.8*  ALBUMIN 5.0 4.1  MG 2.3  --     ------------------------------------------------------------------------------------------------------------------ No results for input(s): "CHOL", "HDL", "LDLCALC", "TRIG", "CHOLHDL", "LDLDIRECT" in the last 72 hours.  No results found for: "HGBA1C" ------------------------------------------------------------------------------------------------------------------ No results for input(s): "TSH", "T4TOTAL", "T3FREE", "THYROIDAB" in the last 72  hours.  Invalid input(s): "FREET3"  Cardiac Enzymes No results for input(s): "CKMB", "TROPONINI", "MYOGLOBIN" in the last 168 hours.  Invalid input(s): "CK" ------------------------------------------------------------------------------------------------------------------ No results found for: "BNP"  CBG: Recent Labs  Lab 07/08/22 1427 07/08/22 2005  GLUCAP 128* 123*    Recent Results (from the past 240 hour(s))  Resp Panel by RT-PCR (Flu A&B, Covid) Urine, Clean Catch     Status: None   Collection Time: 07/08/22  2:35 PM   Specimen: Urine, Clean Catch; Nasal Swab  Result Value Ref Range Status   SARS Coronavirus 2 by RT PCR NEGATIVE NEGATIVE Final    Comment: (NOTE) SARS-CoV-2 target nucleic acids are NOT DETECTED.  The SARS-CoV-2 RNA is generally detectable in upper respiratory specimens during the acute phase of infection. The lowest concentration of SARS-CoV-2 viral copies this assay can detect is 138 copies/mL. A negative result does not preclude SARS-Cov-2 infection and should not be used as the sole basis for treatment or other patient management decisions. A negative result may occur with  improper specimen collection/handling, submission of specimen other than nasopharyngeal swab, presence of viral mutation(s) within the areas targeted by this assay, and inadequate number of viral copies(<138 copies/mL). A negative result must be combined with clinical observations, patient  history, and epidemiological information. The expected result is Negative.  Fact Sheet for Patients:  BloggerCourse.com  Fact Sheet for Healthcare Providers:  SeriousBroker.it  This test is no t yet approved or cleared by the [place] FDA and  has been authorized for detection and/or diagnosis of SARS-CoV-2 by FDA under an Emergency Use Authorization (EUA). This EUA will remain  in effect (meaning this test can be used) for the duration  of the COVID-19 declaration under Section 564(b)(1) of the Act, 21 U.S.C.section 360bbb-3(b)(1), unless the authorization is terminated  or revoked sooner.       Influenza A by PCR NEGATIVE NEGATIVE Final   Influenza B by PCR NEGATIVE NEGATIVE Final    Comment: (NOTE) The Xpert Xpress SARS-CoV-2/FLU/RSV plus assay is intended as an aid in the diagnosis of influenza from Nasopharyngeal swab specimens and should not be used as a sole basis for treatment. Nasal washings and aspirates are unacceptable for Xpert Xpress SARS-CoV-2/FLU/RSV testing.  Fact Sheet for Patients: BloggerCourse.com  Fact Sheet for Healthcare Providers: SeriousBroker.it  This test is not yet approved or cleared by the [place] FDA and has been authorized for detection and/or diagnosis of SARS-CoV-2 by FDA under an Emergency Use Authorization (EUA). This EUA will remain in effect (meaning this test can be used) for the duration of the COVID-19 declaration under Section 564(b)(1) of the Act, 21 U.S.C. section 360bbb-3(b)(1), unless the authorization is terminated or revoked.  Performed at [facility], [address] [place], [place] [address]      Radiology Studies: US Abdomen Limited RUQ (LIVER/GB)  Result Date: 07/09/2022 CLINICAL DATA:  Elevated bilirubin. EXAM: ULTRASOUND ABDOMEN LIMITED RIGHT UPPER QUADRANT COMPARISON:  January 22, 2022. FINDINGS: Gallbladder: No gallstones or wall thickening visualized. No sonographic Murphy sign noted by sonographer. Common bile duct: Diameter: 3 mm which is within normal limits. Liver: No focal lesion identified. Within normal limits in parenchymal echogenicity. Portal vein is patent on color Doppler imaging with normal direction of blood flow towards the liver. Other: None. IMPRESSION: No definite abnormality seen in the right upper quadrant of the abdomen. Electronically Signed   By: Lupita Raider M.D.   On: 07/09/2022 09:41   US RENAL  Result Date: 07/08/2022 CLINICAL DATA:  Acute renal insufficiency EXAM: RENAL / URINARY TRACT ULTRASOUND COMPLETE COMPARISON:  CT abdomen pelvis 01/22/2022 FINDINGS: Right Kidney: Renal measurements: 10.9 x 4.6 x 4.0 cm = volume: 107 mL. Echogenicity within normal limits. No mass or hydronephrosis visualized. Left Kidney: Renal measurements: 11.2 x 5.1 x 5.6 cm = volume: 167 mL. Echogenicity within normal limits. No mass or hydronephrosis visualized. Bladder: Appears normal for degree of bladder distention. Other: Mild prostatic hypertrophy noted IMPRESSION: Normal renal sonogram. Electronically Signed   By: Helyn Numbers M.D.   On: 07/08/2022 23:30   DG Abd 1 View  Result Date: 07/08/2022 CLINICAL DATA:  Abdominal pain, vomiting. EXAM: ABDOMEN - 1 VIEW COMPARISON:  None Available. FINDINGS: The bowel gas pattern is normal. No radio-opaque calculi or other significant radiographic abnormality are seen. IMPRESSION: Negative. Electronically Signed   By: Helyn Numbers M.D.   On: 07/08/2022 21:52     Pamella Pert, MD, PhD [facility]  Between 7 am - 7 pm I am available, please contact me via Amion (for emergencies) or Securechat (non urgent messages)  Between 7 pm - 7 am I am not available, please contact night coverage MD/APP via Amion

## 2022-07-10 LAB — COMPREHENSIVE METABOLIC PANEL
ALT: 18 U/L (ref 0–44)
AST: 21 U/L (ref 15–41)
Albumin: 3.7 g/dL (ref 3.5–5.0)
Alkaline Phosphatase: 54 U/L (ref 38–126)
Anion gap: 9 (ref 5–15)
BUN: 11 mg/dL (ref 6–20)
CO2: 29 mmol/L (ref 22–32)
Calcium: 8.8 mg/dL — ABNORMAL LOW (ref 8.9–10.3)
Chloride: 102 mmol/L (ref 98–111)
Creatinine, Ser: 0.92 mg/dL (ref 0.61–1.24)
GFR, Estimated: >60 mL/min (ref >60)
Glucose, Bld: 95 mg/dL (ref 70–99)
Potassium: 3.5 mmol/L (ref 3.5–5.1)
Sodium: 140 mmol/L (ref 135–145)
Total Bilirubin: 1.7 mg/dL — ABNORMAL HIGH (ref 0.3–1.2)
Total Protein: 6.8 g/dL (ref 6.5–8.1)

## 2022-07-10 LAB — CBC
HCT: 38.5 % — ABNORMAL LOW (ref 39.0–52.0)
Hemoglobin: 13.1 g/dL (ref 13.0–17.0)
MCH: 32.5 pg (ref 26.0–34.0)
MCHC: 34 g/dL (ref 30.0–36.0)
MCV: 95.5 fL (ref 80.0–100.0)
Platelets: 308 10*3/uL (ref 150–400)
RBC: 4.03 MIL/uL — ABNORMAL LOW (ref 4.22–5.81)
RDW: 12.5 % (ref 11.5–15.5)
WBC: 9.6 10*3/uL (ref 4.0–10.5)
nRBC: 0 % (ref 0.0–0.2)

## 2022-07-10 LAB — PHOSPHORUS: Phosphorus: 3.1 mg/dL (ref 2.5–4.6)

## 2022-07-10 LAB — MAGNESIUM: Magnesium: 2 mg/dL (ref 1.7–2.4)

## 2022-07-10 MED ORDER — DICYCLOMINE HCL 10 MG PO CAPS
10.0000 mg | ORAL_CAPSULE | Freq: Three times a day (TID) | ORAL | Status: DC
Start: 2022-07-10 — End: 2022-07-11
  Administered 2022-07-10 – 2022-07-11 (×4): 10 mg via ORAL
  Filled 2022-07-10 (×4): qty 1

## 2022-07-10 NOTE — Progress Notes (Signed)
PROGRESS NOTE  Victor Johnson ZHY:[id number] DOB: 03/29/1976 DOA: 07/08/2022 PCP: Grayce Sessions, NP   LOS: 1 day   Brief Narrative / Interim history: 46 year old male with HTN, presents with abdominal pain, nausea and vomiting the day prior to admission.  No apparent triggers, no changes in his diet, no sick contacts.  Denies any fevers.  He also has been having significant abdominal cramping.  Given persistent symptoms came back to the hospital.  Significant events: 9/3-admission  Significant imaging / results / micro data: Abdominal x-ray 9/3-negative Renal ultrasound 9/3-normal renal sonogram Right upper quadrant ultrasound 9/4-no abnormalities, no gallstones, no wall thickening, CBD 3 mm CT abdomen 9/4-colonic diverticulosis, no diverticulitis or any other acute findings  Subjective / 24h Interval events: Still having some lower abdominal pain, no nausea this morning  Assesement and Plan: Principal Problem:   AKI (acute kidney injury) (HCC) Active Problems:   Nausea and vomiting   Abdominal pain   Hypokalemia   HTN (hypertension)   Intractable nausea and vomiting  Principal problem Acute kidney injury-creatinine on admission 3.29, likely in the setting of poor p.o. intake due to nausea, vomiting.  With fluids, creatinine has now normalized.  Active problems Nausea, vomiting, abdominal pain-this has been a recurrent problem for quite some time, he gets these episodes intermittently.  So far imaging has been unremarkable, however on chart review, he had a CT scan back in March 2023 that showed mild wall thickening in the distal descending and sigmoid colon, also some wall thickening of the distal esophagus.  Currently his pain is mainly bilateral lower quadrants.  Repeat CT scan fairly unremarkable.  Due to persistent symptoms, GI was consulted, saw patient today, concern for cyclic vomiting from marijuana use versus IBS.  Started on dicyclomine today -Given marginal  improvement, advance to full liquids and monitor.  Stop fluids.  If he is able to tolerate diet advancement could potentially go home on Wednesday   Leukocytosis-likely reactive, white count is normalized today  Hypokalemia-replete as indicated, recheck in the morning  Essential hypertension-blood pressure acceptable, continue amlodipine, hold home HCTZ.  Stop further fluids today  Elevated bilirubin-unclear significance now, right upper quadrant ultrasound unremarkable.  He has had elevated T. bili in the past  Scheduled Meds:  amLODipine  10 mg Oral Daily   dicyclomine  10 mg Oral TID AC & HS   pantoprazole (PROTONIX) IV  40 mg Intravenous Q12H   Continuous Infusions:  sodium chloride 125 mL/hr at 07/10/22 0645   promethazine (PHENERGAN) injection (IM or IVPB) 12.5 mg (07/09/22 2010)   PRN Meds:.acetaminophen **OR** acetaminophen, ondansetron **OR** ondansetron (ZOFRAN) IV, promethazine (PHENERGAN) injection (IM or IVPB)  Current Outpatient Medications  Medication Instructions   amLODipine (NORVASC) 10 mg, Oral, Daily   cyclobenzaprine (FLEXERIL) 10 mg, Oral, 2 times daily PRN   hydrochlorothiazide (HYDRODIURIL) 25 mg, Oral, Daily   potassium chloride SA (KLOR-CON M) 20 MEQ tablet 40 mEq, Oral, Daily    Diet Orders (From admission, onward)     Start     Ordered   07/10/22 1156  Diet full liquid Room service appropriate? Yes; Fluid consistency: Thin  Diet effective now       Question Answer Comment  Room service appropriate? Yes   Fluid consistency: Thin      07/10/22 1155            DVT prophylaxis: SCDs Start: 07/08/22 2129   Lab Results  Component Value Date   PLT 308 07/10/2022  Code Status: Full Code  Family Communication: Wife at the bedside  Status is: Inpatient   Level of care: Med-Surg  Consultants:  None  Objective: Vitals:   07/09/22 1323 07/09/22 2222 07/10/22 0539 07/10/22 1151  BP: (!) 158/106 (!) 151/95 (!) 127/93 (!) 132/93   Pulse: 77 94 80 80  Resp: 18 18 18 18   Temp: 98.6 F (37 C) 99.5 F (37.5 C) 98.4 F (36.9 C) 98.4 F (36.9 C)  TempSrc: Oral Oral Oral Oral  SpO2: 100% 99% 100% 100%    Intake/Output Summary (Last 24 hours) at 07/10/2022 1425 Last data filed at 07/10/2022 1152 Gross per 24 hour  Intake 2091.25 ml  Output 701 ml  Net 1390.25 ml    Wt Readings from Last 3 Encounters:  01/22/22 68 kg  01/18/22 68 kg  10/03/21 65.8 kg    Examination:  Constitutional: NAD Eyes: lids and conjunctivae normal, no scleral icterus ENMT: mmm Neck: normal, supple Respiratory: clear to auscultation bilaterally, no wheezing, no crackles. Normal respiratory effort.  Cardiovascular: Regular rate and rhythm, no murmurs / rubs / gallops. No LE edema. Abdomen: soft, minimal tenderness bilateral lower quadrants, no guarding or rebound.  Skin: no rashes Neurologic: no focal deficits, equal strength   Data Reviewed: I have independently reviewed following labs and imaging studies   CBC Recent Labs  Lab 07/08/22 1451 07/09/22 0347 07/10/22 0337  WBC 16.6* 14.5* 9.6  HGB 15.3 14.1 13.1  HCT 43.9 41.1 38.5*  PLT 368 335 308  MCV 93.0 94.7 95.5  MCH 32.4 32.5 32.5  MCHC 34.9 34.3 34.0  RDW 12.9 12.7 12.5     Recent Labs  Lab 07/08/22 1451 07/09/22 0347 07/10/22 0337  NA 138 139 140  K 3.4* 3.5 3.5  CL 94* 102 102  CO2 28 28 29   GLUCOSE 115* 113* 95  BUN 29* 19 11  CREATININE 3.29* 1.38* 0.92  CALCIUM 9.8 9.2 8.8*  AST 22 22 21   ALT 22 18 18   ALKPHOS 66 56 54  BILITOT 1.6* 1.8* 1.7*  ALBUMIN 5.0 4.1 3.7  MG 2.3  --  2.0     ------------------------------------------------------------------------------------------------------------------ No results for input(s): "CHOL", "HDL", "LDLCALC", "TRIG", "CHOLHDL", "LDLDIRECT" in the last 72 hours.  No results found for:  "HGBA1C" ------------------------------------------------------------------------------------------------------------------ No results for input(s): "TSH", "T4TOTAL", "T3FREE", "THYROIDAB" in the last 72 hours.  Invalid input(s): "FREET3"  Cardiac Enzymes No results for input(s): "CKMB", "TROPONINI", "MYOGLOBIN" in the last 168 hours.  Invalid input(s): "CK" ------------------------------------------------------------------------------------------------------------------ No results found for: "BNP"  CBG: Recent Labs  Lab 07/08/22 1427 07/08/22 2005  GLUCAP 128* 123*     Recent Results (from the past 240 hour(s))  Resp Panel by RT-PCR (Flu A&B, Covid) Urine, Clean Catch     Status: None   Collection Time: 07/08/22  2:35 PM   Specimen: Urine, Clean Catch; Nasal Swab  Result Value Ref Range Status   SARS Coronavirus 2 by RT PCR NEGATIVE NEGATIVE Final    Comment: (NOTE) SARS-CoV-2 target nucleic acids are NOT DETECTED.  The SARS-CoV-2 RNA is generally detectable in upper respiratory specimens during the acute phase of infection. The lowest concentration of SARS-CoV-2 viral copies this assay can detect is 138 copies/mL. A negative result does not preclude SARS-Cov-2 infection and should not be used as the sole basis for treatment or other patient management decisions. A negative result may occur with  improper specimen collection/handling, submission of specimen other than nasopharyngeal swab, presence of viral mutation(s) within the  areas targeted by this assay, and inadequate number of viral copies(<138 copies/mL). A negative result must be combined with clinical observations, patient history, and epidemiological information. The expected result is Negative.  Fact Sheet for Patients:  BloggerCourse.com  Fact Sheet for Healthcare Providers:  SeriousBroker.it  This test is no t yet approved or cleared by the [place]  FDA and  has been authorized for detection and/or diagnosis of SARS-CoV-2 by FDA under an Emergency Use Authorization (EUA). This EUA will remain  in effect (meaning this test can be used) for the duration of the COVID-19 declaration under Section 564(b)(1) of the Act, 21 U.S.C.section 360bbb-3(b)(1), unless the authorization is terminated  or revoked sooner.       Influenza A by PCR NEGATIVE NEGATIVE Final   Influenza B by PCR NEGATIVE NEGATIVE Final    Comment: (NOTE) The Xpert Xpress SARS-CoV-2/FLU/RSV plus assay is intended as an aid in the diagnosis of influenza from Nasopharyngeal swab specimens and should not be used as a sole basis for treatment. Nasal washings and aspirates are unacceptable for Xpert Xpress SARS-CoV-2/FLU/RSV testing.  Fact Sheet for Patients: BloggerCourse.com  Fact Sheet for Healthcare Providers: SeriousBroker.it  This test is not yet approved or cleared by the [place] FDA and has been authorized for detection and/or diagnosis of SARS-CoV-2 by FDA under an Emergency Use Authorization (EUA). This EUA will remain in effect (meaning this test can be used) for the duration of the COVID-19 declaration under Section 564(b)(1) of the Act, 21 U.S.C. section 360bbb-3(b)(1), unless the authorization is terminated or revoked.  Performed at [facility], [address], [place], [place] [address]      Radiology Studies: CT ABDOMEN PELVIS WO CONTRAST  Result Date: 07/09/2022 CLINICAL DATA:  Bilateral lower quadrant pain.  Nausea and vomiting. EXAM: CT ABDOMEN AND PELVIS WITHOUT CONTRAST TECHNIQUE: Multidetector CT imaging of the abdomen and pelvis was performed following the standard protocol without IV contrast. RADIATION DOSE REDUCTION: This exam was performed according to the departmental dose-optimization program which includes automated exposure control, adjustment of the mA and/or kV  according to patient size and/or use of iterative reconstruction technique. COMPARISON:  01/22/2022 FINDINGS: Lower chest: No acute findings. Hepatobiliary:  No mass visualized on this unenhanced exam. Pancreas: No mass or inflammatory process visualized on this unenhanced exam. Spleen:  Within normal limits in size. Adrenals/Urinary tract: No evidence of urolithiasis or hydronephrosis. Unremarkable unopacified urinary bladder. Stomach/Bowel: No evidence of obstruction, inflammatory process, or abnormal fluid collections. Normal appendix visualized. Diffuse colonic diverticulosis is again seen, however there is no evidence of diverticulitis. Vascular/Lymphatic: No pathologically enlarged lymph nodes identified. No evidence of abdominal aortic aneurysm. Reproductive:  No mass or other significant abnormality. Other:  None. Musculoskeletal:  No suspicious bone lesions identified. IMPRESSION: Colonic diverticulosis. No radiographic evidence of diverticulitis or other acute findings. Electronically Signed   By: Danae Orleans M.D.   On: 07/09/2022 16:56     Pamella Pert, MD, PhD [facility]  Between 7 am - 7 pm I am available, please contact me via Amion (for emergencies) or Securechat (non urgent messages)  Between 7 pm - 7 am I am not available, please contact night coverage MD/APP via Amion

## 2022-07-10 NOTE — Consult Note (Signed)
Referring Provider: [facility] Primary Care Physician:  Grayce Sessions, NP Primary Gastroenterologist:  Gentry Fitz  Reason for Consultation:  Abdominal pain, nausea, vomiting  HPI: Kinneth Fujiwara is a 46 y.o. male with pmh of HTN, presented to the emergency room 07/08/2022 for abdominal pain, nausea, vomiting.    Patient notes that starting Saturday morning he began to have lower abdominal cramping.  He went to the bathroom after onset of lower abdominal pain and had a loose stool, then developed nausea and vomiting.  He has had multiple episodes of emesis unsure how many but notes they were nonbloody and nonbilious.  He took his wife's diabetes medication thinking it was an antispasmodic medication.  He began to feel worse and presented to the emergency room.  Notes once every couple of months he will have these episodes of lower abdominal cramping with associated loose stools, nausea, vomiting.  Reports his symptoms improve after taking a hot shower or bath.  He was given Bentyl in the emergency room and notes that this improved his pain as well.  Zofran does not improve his nausea but promethazine does improve the nausea.  Denies melena, hematochezia, constipation, unintentional weight loss.  Denies family history of colon cancer or IBD.   Patient was previously seen for diarrhea and emesis in March 2023.  At that time CT scan with contrast showed mild wall thickening of the distal descending and sigmoid colon as well as wall thickening of the distal esophagus.  He also had diverticulosis without diverticulitis.  Patient was sent home with azithromycin and advised to follow-up with PCP and GI.   Patient reports daily marijuana use. Reports 30-pack-year smoking history Reports drinking 2-3 24 ounce coolers of Mike hard lemonade daily.   Past Medical History:  Diagnosis Date   Gastritis    Hypertension     History reviewed. No pertinent surgical history.  Prior to Admission medications    Medication Sig Start Date End Date Taking? Authorizing Provider  amLODipine (NORVASC) 10 MG tablet Take 1 tablet (10 mg total) by mouth daily. 08/31/21  Yes Grayce Sessions, NP  cyclobenzaprine (FLEXERIL) 10 MG tablet Take 1 tablet (10 mg total) by mouth 2 (two) times daily as needed for muscle spasms. 08/31/21  Yes Grayce Sessions, NP  hydrochlorothiazide (HYDRODIURIL) 25 MG tablet Take 1 tablet (25 mg total) by mouth daily. 08/31/21 07/08/22 Yes Grayce Sessions, NP  potassium chloride SA (KLOR-CON M) 20 MEQ tablet Take 2 tablets (40 mEq total) by mouth daily for 4 days. 01/22/22 01/26/22  Long, Arlyss Repress, MD    Scheduled Meds:  amLODipine  10 mg Oral Daily   pantoprazole (PROTONIX) IV  40 mg Intravenous Q12H   Continuous Infusions:  sodium chloride 125 mL/hr at 07/10/22 0645   promethazine (PHENERGAN) injection (IM or IVPB) 12.5 mg (07/09/22 2010)   PRN Meds:.acetaminophen **OR** acetaminophen, ondansetron **OR** ondansetron (ZOFRAN) IV, promethazine (PHENERGAN) injection (IM or IVPB)  Allergies as of 07/08/2022   (No Known Allergies)    History reviewed. No pertinent family history.  Social History   Socioeconomic History   Marital status: Single    Spouse name: Not on file   Number of children: Not on file   Years of education: Not on file   Highest education level: Not on file  Occupational History   Not on file  Tobacco Use   Smoking status: Every Day    Packs/day: 1.00    Types: Cigarettes   Smokeless tobacco: Never  Substance  and Sexual Activity   Alcohol use: Yes    Comment: mikes 2 x daily   Drug use: Yes    Types: Marijuana    Comment: 2 x daily   Sexual activity: Not on file  Other Topics Concern   Not on file  Social History Narrative   Not on file   Social Determinants of Health   Financial Resource Strain: Not on file  Food Insecurity: Not on file  Transportation Needs: Not on file  Physical Activity: Not on file  Stress: Not on file   Social Connections: Not on file  Intimate Partner Violence: Not on file    Review of Systems: All negative except as stated above in HPI.  Physical Exam:Physical Exam Constitutional:      General: He is not in acute distress.    Appearance: He is normal weight.  HENT:     Head: Normocephalic and atraumatic.     Right Ear: External ear normal.     Left Ear: External ear normal.     Nose: Nose normal.     Mouth/Throat:     Mouth: Mucous membranes are moist.  Eyes:     Pupils: Pupils are equal, round, and reactive to light.  Cardiovascular:     Rate and Rhythm: Normal rate and regular rhythm.     Pulses: Normal pulses.     Heart sounds: Normal heart sounds.  Pulmonary:     Effort: Pulmonary effort is normal.     Breath sounds: Normal breath sounds.  Abdominal:     General: Abdomen is flat. Bowel sounds are normal. There is no distension.     Palpations: Abdomen is soft. There is no mass.     Tenderness: There is abdominal tenderness (mild b/l lower abdomen). There is no guarding or rebound.     Hernia: No hernia is present.  Musculoskeletal:        General: No swelling. Normal range of motion.     Cervical back: Normal range of motion and neck supple.  Skin:    General: Skin is warm and dry.     Coloration: Skin is not pale.  Neurological:     General: No focal deficit present.     Mental Status: He is alert and oriented to person, place, and time. Mental status is at baseline.  Psychiatric:        Mood and Affect: Mood normal.        Behavior: Behavior normal.     Vital signs: Vitals:   07/09/22 2222 07/10/22 0539  BP: (!) 151/95 (!) 127/93  Pulse: 94 80  Resp: 18 18  Temp: 99.5 F (37.5 C) 98.4 F (36.9 C)  SpO2: 99% 100%   Last BM Date : 07/09/22    GI:  Lab Results: Recent Labs    07/08/22 1451 07/09/22 0347 07/10/22 0337  WBC 16.6* 14.5* 9.6  HGB 15.3 14.1 13.1  HCT 43.9 41.1 38.5*  PLT 368 335 308   BMET Recent Labs    07/08/22 1451  07/09/22 0347 07/10/22 0337  NA 138 139 140  K 3.4* 3.5 3.5  CL 94* 102 102  CO2 28 28 29   GLUCOSE 115* 113* 95  BUN 29* 19 11  CREATININE 3.29* 1.38* 0.92  CALCIUM 9.8 9.2 8.8*   LFT Recent Labs    07/10/22 0337  PROT 6.8  ALBUMIN 3.7  AST 21  ALT 18  ALKPHOS 54  BILITOT 1.7*   PT/INR No results for  input(s): "LABPROT", "INR" in the last 72 hours.   Studies/Results: CT ABDOMEN PELVIS WO CONTRAST  Result Date: 07/09/2022 CLINICAL DATA:  Bilateral lower quadrant pain.  Nausea and vomiting. EXAM: CT ABDOMEN AND PELVIS WITHOUT CONTRAST TECHNIQUE: Multidetector CT imaging of the abdomen and pelvis was performed following the standard protocol without IV contrast. RADIATION DOSE REDUCTION: This exam was performed according to the departmental dose-optimization program which includes automated exposure control, adjustment of the mA and/or kV according to patient size and/or use of iterative reconstruction technique. COMPARISON:  01/22/2022 FINDINGS: Lower chest: No acute findings. Hepatobiliary:  No mass visualized on this unenhanced exam. Pancreas: No mass or inflammatory process visualized on this unenhanced exam. Spleen:  Within normal limits in size. Adrenals/Urinary tract: No evidence of urolithiasis or hydronephrosis. Unremarkable unopacified urinary bladder. Stomach/Bowel: No evidence of obstruction, inflammatory process, or abnormal fluid collections. Normal appendix visualized. Diffuse colonic diverticulosis is again seen, however there is no evidence of diverticulitis. Vascular/Lymphatic: No pathologically enlarged lymph nodes identified. No evidence of abdominal aortic aneurysm. Reproductive:  No mass or other significant abnormality. Other:  None. Musculoskeletal:  No suspicious bone lesions identified. IMPRESSION: Colonic diverticulosis. No radiographic evidence of diverticulitis or other acute findings. Electronically Signed   By: Danae Orleans M.D.   On: 07/09/2022 16:56   US  Abdomen Limited RUQ (LIVER/GB)  Result Date: 07/09/2022 CLINICAL DATA:  Elevated bilirubin. EXAM: ULTRASOUND ABDOMEN LIMITED RIGHT UPPER QUADRANT COMPARISON:  January 22, 2022. FINDINGS: Gallbladder: No gallstones or wall thickening visualized. No sonographic Murphy sign noted by sonographer. Common bile duct: Diameter: 3 mm which is within normal limits. Liver: No focal lesion identified. Within normal limits in parenchymal echogenicity. Portal vein is patent on color Doppler imaging with normal direction of blood flow towards the liver. Other: None. IMPRESSION: No definite abnormality seen in the right upper quadrant of the abdomen. Electronically Signed   By: Lupita Raider M.D.   On: 07/09/2022 09:41   US RENAL  Result Date: 07/08/2022 CLINICAL DATA:  Acute renal insufficiency EXAM: RENAL / URINARY TRACT ULTRASOUND COMPLETE COMPARISON:  CT abdomen pelvis 01/22/2022 FINDINGS: Right Kidney: Renal measurements: 10.9 x 4.6 x 4.0 cm = volume: 107 mL. Echogenicity within normal limits. No mass or hydronephrosis visualized. Left Kidney: Renal measurements: 11.2 x 5.1 x 5.6 cm = volume: 167 mL. Echogenicity within normal limits. No mass or hydronephrosis visualized. Bladder: Appears normal for degree of bladder distention. Other: Mild prostatic hypertrophy noted IMPRESSION: Normal renal sonogram. Electronically Signed   By: Helyn Numbers M.D.   On: 07/08/2022 23:30   DG Abd 1 View  Result Date: 07/08/2022 CLINICAL DATA:  Abdominal pain, vomiting. EXAM: ABDOMEN - 1 VIEW COMPARISON:  None Available. FINDINGS: The bowel gas pattern is normal. No radio-opaque calculi or other significant radiographic abnormality are seen. IMPRESSION: Negative. Electronically Signed   By: Helyn Numbers M.D.   On: 07/08/2022 21:52    Impression: Abdominal pain Nausea, Vomiting  Right upper quadrant ultrasound 07/09/2022 No definite abnormality seen in the right upper quadrant  CT abdomen pelvis without contrast  07/09/2022 Colonic diverticulosis without diverticulitis, no other acute findings.  CT abdomen pelvis with contrast 01/22/2022 Mild wall thickening in the distal descending and sigmoid colon, wall thickening of the distal esophagus.   No elevation in transaminases, total bilirubin slightly elevated but at baseline for patient, creatinine improved to 0.92 today, hemoglobin 13.1 stable, white blood cells 9.6 decreased from 14.5 yesterday.  Patient with abdominal cramping associated with loose stools,  nausea and vomiting.  Notes that he is most bothered by the abdominal cramping.  Previously relieved with Bentyl.  Patient has significant marijuana use history.  Possible nausea and vomiting secondary to cannabinoid hyperemesis.  Plan: Start Bentyl 10 mg 3 times daily as needed for lower abdominal cramping. We will plan for outpatient follow-up for further evaluation of diarrhea as well as to set up outpatient colonoscopy and EGD for previous CT scan findings. Advance diet as tolerated Continue antiemetics as needed. [facility] will follow.   LOS: 1 day   Emmit Alexanders  PA-C 07/10/2022, 8:47 AM  Contact #  [phone]

## 2022-07-11 LAB — CBC
HCT: 37.2 % — ABNORMAL LOW (ref 39.0–52.0)
Hemoglobin: 12.7 g/dL — ABNORMAL LOW (ref 13.0–17.0)
MCH: 32.4 pg (ref 26.0–34.0)
MCHC: 34.1 g/dL (ref 30.0–36.0)
MCV: 94.9 fL (ref 80.0–100.0)
Platelets: 318 10*3/uL (ref 150–400)
RBC: 3.92 MIL/uL — ABNORMAL LOW (ref 4.22–5.81)
RDW: 12.2 % (ref 11.5–15.5)
WBC: 9.4 10*3/uL (ref 4.0–10.5)
nRBC: 0 % (ref 0.0–0.2)

## 2022-07-11 LAB — BASIC METABOLIC PANEL
Anion gap: 7 (ref 5–15)
BUN: 10 mg/dL (ref 6–20)
CO2: 28 mmol/L (ref 22–32)
Calcium: 8.6 mg/dL — ABNORMAL LOW (ref 8.9–10.3)
Chloride: 103 mmol/L (ref 98–111)
Creatinine, Ser: 0.96 mg/dL (ref 0.61–1.24)
GFR, Estimated: >60 mL/min (ref >60)
Glucose, Bld: 92 mg/dL (ref 70–99)
Potassium: 3.4 mmol/L — ABNORMAL LOW (ref 3.5–5.1)
Sodium: 138 mmol/L (ref 135–145)

## 2022-07-11 LAB — MAGNESIUM: Magnesium: 1.7 mg/dL (ref 1.7–2.4)

## 2022-07-11 MED ORDER — ONDANSETRON HCL 4 MG PO TABS: 4.0000 mg | ORAL_TABLET | Freq: Four times a day (QID) | ORAL | 0 refills | Status: DC | PRN

## 2022-07-11 MED ORDER — DICYCLOMINE HCL 10 MG PO CAPS: 10.0000 mg | ORAL_CAPSULE | Freq: Three times a day (TID) | ORAL | 0 refills | Status: DC | PRN

## 2022-07-11 NOTE — Discharge Summary (Signed)
Physician Discharge Summary  Victor Johnson VQQ:[id number] DOB: 02-12-1976 DOA: 07/08/2022  PCP: Grayce Sessions, NP  Admit date: 07/08/2022 Discharge date: 07/11/2022  Admitted From: Home Disposition:  Home   Recommendations for Outpatient Follow-up:  Follow up with PCP in 1 week Follow up with GI for outpatient evaluation for colonoscopy and EGD  Discharge Condition: Stable CODE STATUS: Full code Diet recommendation: Advance diet as tolerated  Brief/Interim Summary: From H&P by Dr. Ronaldo Miyamoto: "Victor Johnson is a 46 y.o. male with medical history significant of HTN. Presenting w/ abdominal pain. He reports that he woke up with N/V yesterday morning. He didn't have a change in diet. He didn't have any sick contacts. There was not any fever. This continued through the day. This morning he had some abdominal cramping His wife thought he might need a muscle relaxer, so she was going to give him one of hers. However, she mistakenly gave him her DM meds. After that, his symptoms worsened. He then decided to come to the ED for evaluation. He denies any other aggravating or alleviating factors."   Patient was admitted for acute kidney injury, nausea, vomiting and abdominal pain.  AKI resolved with IV fluids.  GI was consulted due to continued symptoms.  Possible etiology of symptoms secondary to cannabinoid hyperemesis versus IBS.  Patient symptoms continue to improve and his diet was advanced.  He will follow-up with outpatient GI.   Discharge Diagnoses:   Principal Problem:   AKI (acute kidney injury) (HCC) Active Problems:   Nausea and vomiting   Abdominal pain   Hypokalemia   HTN (hypertension)   Intractable nausea and vomiting    Discharge Instructions  Discharge Instructions     Call MD for:  difficulty breathing, headache or visual disturbances   Complete by: As directed    Call MD for:  extreme fatigue   Complete by: As directed    Call MD for:  persistant dizziness or  light-headedness   Complete by: As directed    Call MD for:  persistant nausea and vomiting   Complete by: As directed    Call MD for:  severe uncontrolled pain   Complete by: As directed    Call MD for:  temperature >100.4   Complete by: As directed    Discharge instructions   Complete by: As directed    You were cared for by a hospitalist during your hospital stay. If you have any questions about your discharge medications or the care you received while you were in the hospital after you are discharged, you can call the unit and ask to speak with the hospitalist on call if the hospitalist that took care of you is not available. Once you are discharged, your primary care physician will handle any further medical issues. Please note that NO REFILLS for any discharge medications will be authorized once you are discharged, as it is imperative that you return to your primary care physician (or establish a relationship with a primary care physician if you do not have one) for your aftercare needs so that they can reassess your need for medications and monitor your lab values.   Increase activity slowly   Complete by: As directed       Allergies as of 07/11/2022   No Known Allergies      Medication List     STOP taking these medications    potassium chloride SA 20 MEQ tablet Commonly known as: Jerene Dilling  TAKE these medications    amLODipine 10 MG tablet Commonly known as: NORVASC Take 1 tablet (10 mg total) by mouth daily.   cyclobenzaprine 10 MG tablet Commonly known as: FLEXERIL Take 1 tablet (10 mg total) by mouth 2 (two) times daily as needed for muscle spasms.   dicyclomine 10 MG capsule Commonly known as: BENTYL Take 1 capsule (10 mg total) by mouth 3 (three) times daily as needed for spasms (lower abdominal cramping).   hydrochlorothiazide 25 MG tablet Commonly known as: HYDRODIURIL Take 1 tablet (25 mg total) by mouth daily.   ondansetron 4 MG tablet Commonly  known as: ZOFRAN Take 1 tablet (4 mg total) by mouth every 6 (six) hours as needed for nausea.        Follow-up Information     Kerin Perna, NP Follow up.   Specialty: Internal Medicine Contact information: [place] [address] [phone]         Otis Brace, MD Follow up.   Specialty: Gastroenterology Contact information: [place] [address] [phone]                No Known Allergies  Consultations: GI   Procedures/Studies: CT ABDOMEN PELVIS WO CONTRAST  Result Date: 07/09/2022 CLINICAL DATA:  Bilateral lower quadrant pain.  Nausea and vomiting. EXAM: CT ABDOMEN AND PELVIS WITHOUT CONTRAST TECHNIQUE: Multidetector CT imaging of the abdomen and pelvis was performed following the standard protocol without IV contrast. RADIATION DOSE REDUCTION: This exam was performed according to the departmental dose-optimization program which includes automated exposure control, adjustment of the mA and/or kV according to patient size and/or use of iterative reconstruction technique. COMPARISON:  01/22/2022 FINDINGS: Lower chest: No acute findings. Hepatobiliary:  No mass visualized on this unenhanced exam. Pancreas: No mass or inflammatory process visualized on this unenhanced exam. Spleen:  Within normal limits in size. Adrenals/Urinary tract: No evidence of urolithiasis or hydronephrosis. Unremarkable unopacified urinary bladder. Stomach/Bowel: No evidence of obstruction, inflammatory process, or abnormal fluid collections. Normal appendix visualized. Diffuse colonic diverticulosis is again seen, however there is no evidence of diverticulitis. Vascular/Lymphatic: No pathologically enlarged lymph nodes identified. No evidence of abdominal aortic aneurysm. Reproductive:  No mass or other significant abnormality. Other:  None. Musculoskeletal:  No suspicious bone lesions identified. IMPRESSION: Colonic diverticulosis. No  radiographic evidence of diverticulitis or other acute findings. Electronically Signed   By: Marlaine Hind M.D.   On: 07/09/2022 16:56   US Abdomen Limited RUQ (LIVER/GB)  Result Date: 07/09/2022 CLINICAL DATA:  Elevated bilirubin. EXAM: ULTRASOUND ABDOMEN LIMITED RIGHT UPPER QUADRANT COMPARISON:  January 22, 2022. FINDINGS: Gallbladder: No gallstones or wall thickening visualized. No sonographic Murphy sign noted by sonographer. Common bile duct: Diameter: 3 mm which is within normal limits. Liver: No focal lesion identified. Within normal limits in parenchymal echogenicity. Portal vein is patent on color Doppler imaging with normal direction of blood flow towards the liver. Other: None. IMPRESSION: No definite abnormality seen in the right upper quadrant of the abdomen. Electronically Signed   By: Marijo Conception M.D.   On: 07/09/2022 09:41   US RENAL  Result Date: 07/08/2022 CLINICAL DATA:  Acute renal insufficiency EXAM: RENAL / URINARY TRACT ULTRASOUND COMPLETE COMPARISON:  CT abdomen pelvis 01/22/2022 FINDINGS: Right Kidney: Renal measurements: 10.9 x 4.6 x 4.0 cm = volume: 107 mL. Echogenicity within normal limits. No mass or hydronephrosis visualized. Left Kidney: Renal measurements: 11.2 x 5.1 x 5.6 cm = volume: 167 mL.  Echogenicity within normal limits. No mass or hydronephrosis visualized. Bladder: Appears normal for degree of bladder distention. Other: Mild prostatic hypertrophy noted IMPRESSION: Normal renal sonogram. Electronically Signed   By: Fidela Salisbury M.D.   On: 07/08/2022 23:30   DG Abd 1 View  Result Date: 07/08/2022 CLINICAL DATA:  Abdominal pain, vomiting. EXAM: ABDOMEN - 1 VIEW COMPARISON:  None Available. FINDINGS: The bowel gas pattern is normal. No radio-opaque calculi or other significant radiographic abnormality are seen. IMPRESSION: Negative. Electronically Signed   By: Fidela Salisbury M.D.   On: 07/08/2022 21:52       Discharge Exam: Vitals:   07/11/22 0619 07/11/22 1000   BP: 124/89   Pulse: 71   Resp: 18   Temp: 98.6 F (37 C)   SpO2: 100% 98%    General: Pt is alert, awake, not in acute distress Cardiovascular: RRR, S1/S2 +, no edema Respiratory: CTA bilaterally, no wheezing, no rhonchi, no respiratory distress, no conversational dyspnea  Abdominal: Soft, NT, ND, bowel sounds + Extremities: no edema, no cyanosis Psych: Normal mood and affect, stable judgement and insight     The results of significant diagnostics from this hospitalization (including imaging, microbiology, ancillary and laboratory) are listed below for reference.     Microbiology: Recent Results (from the past 240 hour(s))  Resp Panel by RT-PCR (Flu A&B, Covid) Urine, Clean Catch     Status: None   Collection Time: 07/08/22  2:35 PM   Specimen: Urine, Clean Catch; Nasal Swab  Result Value Ref Range Status   SARS Coronavirus 2 by RT PCR NEGATIVE NEGATIVE Final    Comment: (NOTE) SARS-CoV-2 target nucleic acids are NOT DETECTED.  The SARS-CoV-2 RNA is generally detectable in upper respiratory specimens during the acute phase of infection. The lowest concentration of SARS-CoV-2 viral copies this assay can detect is 138 copies/mL. A negative result does not preclude SARS-Cov-2 infection and should not be used as the sole basis for treatment or other patient management decisions. A negative result may occur with  improper specimen collection/handling, submission of specimen other than nasopharyngeal swab, presence of viral mutation(s) within the areas targeted by this assay, and inadequate number of viral copies(<138 copies/mL). A negative result must be combined with clinical observations, patient history, and epidemiological information. The expected result is Negative.  Fact Sheet for Patients:  EntrepreneurPulse.com.au  Fact Sheet for Healthcare Providers:  IncredibleEmployment.be  This test is no t yet approved or cleared by the  [place] FDA and  has been authorized for detection and/or diagnosis of SARS-CoV-2 by FDA under an Emergency Use Authorization (EUA). This EUA will remain  in effect (meaning this test can be used) for the duration of the COVID-19 declaration under Section 564(b)(1) of the Act, 21 U.S.C.section 360bbb-3(b)(1), unless the authorization is terminated  or revoked sooner.       Influenza A by PCR NEGATIVE NEGATIVE Final   Influenza B by PCR NEGATIVE NEGATIVE Final    Comment: (NOTE) The Xpert Xpress SARS-CoV-2/FLU/RSV plus assay is intended as an aid in the diagnosis of influenza from Nasopharyngeal swab specimens and should not be used as a sole basis for treatment. Nasal washings and aspirates are unacceptable for Xpert Xpress SARS-CoV-2/FLU/RSV testing.  Fact Sheet for Patients: EntrepreneurPulse.com.au  Fact Sheet for Healthcare Providers: IncredibleEmployment.be  This test is not yet approved or cleared by the [place] FDA and has been authorized for detection and/or diagnosis of SARS-CoV-2 by FDA under an Emergency Use Authorization (EUA). This EUA  will remain in effect (meaning this test can be used) for the duration of the COVID-19 declaration under Section 564(b)(1) of the Act, 21 U.S.C. section 360bbb-3(b)(1), unless the authorization is terminated or revoked.  Performed at [facility], [place] [address], [place], [place] [address]      Labs: BNP (last 3 results) No results for input(s): "BNP" in the last 8760 hours. Basic Metabolic Panel: Recent Labs  Lab 07/08/22 1451 07/09/22 0347 07/10/22 0337 07/11/22 0431  NA 138 139 140 138  K 3.4* 3.5 3.5 3.4*  CL 94* 102 102 103  CO2 28 28 29 28   GLUCOSE 115* 113* 95 92  BUN 29* 19 11 10   CREATININE 3.29* 1.38* 0.92 0.96  CALCIUM 9.8 9.2 8.8* 8.6*  MG 2.3  --  2.0 1.7  PHOS  --   --  3.1  --    Liver Function Tests: Recent Labs  Lab 07/08/22 1451  07/09/22 0347 07/10/22 0337  AST 22 22 21   ALT 22 18 18   ALKPHOS 66 56 54  BILITOT 1.6* 1.8* 1.7*  PROT 8.6* 7.4 6.8  ALBUMIN 5.0 4.1 3.7   Recent Labs  Lab 07/08/22 1451  LIPASE 34   No results for input(s): "AMMONIA" in the last 168 hours. CBC: Recent Labs  Lab 07/08/22 1451 07/09/22 0347 07/10/22 0337 07/11/22 0431  WBC 16.6* 14.5* 9.6 9.4  HGB 15.3 14.1 13.1 12.7*  HCT 43.9 41.1 38.5* 37.2*  MCV 93.0 94.7 95.5 94.9  PLT 368 335 308 318   Cardiac Enzymes: Recent Labs  Lab 07/08/22 1451  CKTOTAL 602*   BNP: Invalid input(s): "POCBNP" CBG: Recent Labs  Lab 07/08/22 1427 07/08/22 2005  GLUCAP 128* 123*   D-Dimer No results for input(s): "DDIMER" in the last 72 hours. Hgb A1c No results for input(s): "HGBA1C" in the last 72 hours. Lipid Profile No results for input(s): "CHOL", "HDL", "LDLCALC", "TRIG", "CHOLHDL", "LDLDIRECT" in the last 72 hours. Thyroid function studies No results for input(s): "TSH", "T4TOTAL", "T3FREE", "THYROIDAB" in the last 72 hours.  Invalid input(s): "FREET3" Anemia work up No results for input(s): "VITAMINB12", "FOLATE", "FERRITIN", "TIBC", "IRON", "RETICCTPCT" in the last 72 hours. Urinalysis    Component Value Date/Time   COLORURINE AMBER (A) 07/08/2022 1433   APPEARANCEUR HAZY (A) 07/08/2022 1433   LABSPEC 1.027 07/08/2022 1433   PHURINE 5.0 07/08/2022 1433   GLUCOSEU NEGATIVE 07/08/2022 1433   HGBUR SMALL (A) 07/08/2022 1433   BILIRUBINUR NEGATIVE 07/08/2022 1433   KETONESUR NEGATIVE 07/08/2022 1433   PROTEINUR 100 (A) 07/08/2022 1433   NITRITE NEGATIVE 07/08/2022 1433   LEUKOCYTESUR NEGATIVE 07/08/2022 1433   Sepsis Labs Recent Labs  Lab 07/08/22 1451 07/09/22 0347 07/10/22 0337 07/11/22 0431  WBC 16.6* 14.5* 9.6 9.4   Microbiology Recent Results (from the past 240 hour(s))  Resp Panel by RT-PCR (Flu A&B, Covid) Urine, Clean Catch     Status: None   Collection Time: 07/08/22  2:35 PM   Specimen: Urine,  Clean Catch; Nasal Swab  Result Value Ref Range Status   SARS Coronavirus 2 by RT PCR NEGATIVE NEGATIVE Final    Comment: (NOTE) SARS-CoV-2 target nucleic acids are NOT DETECTED.  The SARS-CoV-2 RNA is generally detectable in upper respiratory specimens during the acute phase of infection. The lowest concentration of SARS-CoV-2 viral copies this assay can detect is 138 copies/mL. A negative result does not preclude SARS-Cov-2 infection and should not be used as the sole basis for treatment or other patient  management decisions. A negative result may occur with  improper specimen collection/handling, submission of specimen other than nasopharyngeal swab, presence of viral mutation(s) within the areas targeted by this assay, and inadequate number of viral copies(<138 copies/mL). A negative result must be combined with clinical observations, patient history, and epidemiological information. The expected result is Negative.  Fact Sheet for Patients:  BloggerCourse.com  Fact Sheet for Healthcare Providers:  SeriousBroker.it  This test is no t yet approved or cleared by the [place] FDA and  has been authorized for detection and/or diagnosis of SARS-CoV-2 by FDA under an Emergency Use Authorization (EUA). This EUA will remain  in effect (meaning this test can be used) for the duration of the COVID-19 declaration under Section 564(b)(1) of the Act, 21 U.S.C.section 360bbb-3(b)(1), unless the authorization is terminated  or revoked sooner.       Influenza A by PCR NEGATIVE NEGATIVE Final   Influenza B by PCR NEGATIVE NEGATIVE Final    Comment: (NOTE) The Xpert Xpress SARS-CoV-2/FLU/RSV plus assay is intended as an aid in the diagnosis of influenza from Nasopharyngeal swab specimens and should not be used as a sole basis for treatment. Nasal washings and aspirates are unacceptable for Xpert Xpress  SARS-CoV-2/FLU/RSV testing.  Fact Sheet for Patients: BloggerCourse.com  Fact Sheet for Healthcare Providers: SeriousBroker.it  This test is not yet approved or cleared by the [place] FDA and has been authorized for detection and/or diagnosis of SARS-CoV-2 by FDA under an Emergency Use Authorization (EUA). This EUA will remain in effect (meaning this test can be used) for the duration of the COVID-19 declaration under Section 564(b)(1) of the Act, 21 U.S.C. section 360bbb-3(b)(1), unless the authorization is terminated or revoked.  Performed at [facility], [address], [place], [place] [address]      Patient was seen and examined on the day of discharge and was found to be in stable condition. Time coordinating discharge: 35 minutes including assessment and coordination of care, as well as examination of the patient.   SIGNED:  Noralee Stain, DO [facility] 07/11/2022, 10:29 AM

## 2022-07-11 NOTE — Progress Notes (Signed)
[facility] Gastroenterology Progress Note  Victor Johnson 46 y.o. 1976/10/06   Subjective: Patient seen and examined laying in bed.  Notes his abdominal pain is much improved with Bentyl.  He has been able to eat some grits yesterday.  He had bowel movement this morning that was more normal in texture.  ROS : Review of Systems  Gastrointestinal:  Negative for abdominal pain, blood in stool, constipation, diarrhea, heartburn, melena, nausea and vomiting.  Genitourinary:  Negative for dysuria and urgency.     Objective: Vital signs in last 24 hours: Vitals:   07/11/22 0619 07/11/22 1000  BP: 124/89   Pulse: 71   Resp: 18   Temp: 98.6 F (37 C)   SpO2: 100% 98%    Physical Exam:  General:  Alert, cooperative, no distress, appears stated age  Head:  Normocephalic, without obvious abnormality, atraumatic  Eyes:  Anicteric sclera, EOM's intact  Lungs:   Clear to auscultation bilaterally, respirations unlabored  Heart:  Regular rate and rhythm, S1, S2 normal  Abdomen:   Soft, non-tender, bowel sounds active all four quadrants,  no masses,   Extremities: Extremities normal, atraumatic, no  edema  Pulses: 2+ and symmetric    Lab Results: Recent Labs    07/10/22 0337 07/11/22 0431  NA 140 138  K 3.5 3.4*  CL 102 103  CO2 29 28  GLUCOSE 95 92  BUN 11 10  CREATININE 0.92 0.96  CALCIUM 8.8* 8.6*  MG 2.0 1.7  PHOS 3.1  --    Recent Labs    07/09/22 0347 07/10/22 0337  AST 22 21  ALT 18 18  ALKPHOS 56 54  BILITOT 1.8* 1.7*  PROT 7.4 6.8  ALBUMIN 4.1 3.7   Recent Labs    07/10/22 0337 07/11/22 0431  WBC 9.6 9.4  HGB 13.1 12.7*  HCT 38.5* 37.2*  MCV 95.5 94.9  PLT 308 318   No results for input(s): "LABPROT", "INR" in the last 72 hours.   Impression: Abdominal pain Nausea, Vomiting   Right upper quadrant ultrasound 07/09/2022 No definite abnormality seen in the right upper quadrant   CT abdomen pelvis without contrast 07/09/2022 Colonic diverticulosis without  diverticulitis, no other acute findings.   CT abdomen pelvis with contrast 01/22/2022 Mild wall thickening in the distal descending and sigmoid colon, wall thickening of the distal esophagus.    No elevation in transaminases, total bilirubin slightly elevated but at baseline for patient, creatinine improved to 0.92 today, hemoglobin 13.1 stable, white blood cells 9.6 decreased from 14.5 yesterday.   Patient with abdominal cramping associated with loose stools, nausea and vomiting.  Notes that he is most bothered by the abdominal cramping.  Previously relieved with Bentyl.  Patient has significant marijuana use history.  Possible nausea and vomiting secondary to cannabinoid hyperemesis.   Plan: Continue Bentyl 10 mg 3 times daily as needed for lower abdominal cramping. We will plan for outpatient follow-up for further evaluation of diarrhea as well as to set up outpatient colonoscopy and EGD for previous CT scan findings. Advance diet as tolerated Continue antiemetics as needed. [facility] will sign off  Emmit Alexanders PA-C 07/11/2022, 10:11 AM  Contact #  [phone]

## 2022-07-11 NOTE — TOC Progression Note (Signed)
Transition of Care [facility]) - Progression Note    Patient Details  Name: Jame Morrell MRN: [id number] Date of Birth: 1976/07/01  [facility]) CM/SW Contact  Geni Bers, RN Phone Number: 07/11/2022, 11:54 AM  Clinical Narrative:      Transition of Care (TOC) Screening Note   Patient Details  Name: Jahlil Ziller Date of Birth: July 08, 1976   Transition of [facility]) CM/SW Contact:    Geni Bers, RN Phone Number: 07/11/2022, 11:54 AM    Transition of Care Department (TOC) has reviewed patient and no TOC needs have been identified at this time. We will continue to monitor patient advancement through interdisciplinary progression rounds. If new patient transition needs arise, please place a TOC consult.         Expected Discharge Plan and Services           Expected Discharge Date: 07/11/22                                     Social Determinants of Health (SDOH) Interventions    Readmission Risk Interventions     No data to display

## 2022-07-12 ENCOUNTER — Telehealth: Payer: Self-pay

## 2022-07-12 NOTE — Telephone Encounter (Signed)
Transition Care Management Follow-up Telephone Call Date of discharge and from where: 07/11/2022, [facility] How have you been since you were released from the hospital? He stated he is feeling a lot better Any questions or concerns? Yes- he would like to know if he can have a refill of the cyclobenzaprine.  He stated he has muscle spasms/cramping from being dehydrated.  Items Reviewed: Did the pt receive and understand the discharge instructions provided? Yes  Medications obtained and verified?  He has the amlodipine and hydrochlorothiazide but still needs to pick up the bentyl and zofran. He is out of cyclobenzaprine.  Other? No  Any new allergies since your discharge? No  Dietary orders reviewed? Yes Do you have support at home? Yes , his fiance.   Home Care and Equipment/Supplies: Were home health services ordered? no If so, what is the name of the agency? N/a  Has the agency set up a time to come to the patient's home? not applicable Were any new equipment or medical supplies ordered?  No What is the name of the medical supply agency? N/a Were you able to get the supplies/equipment? not applicable Do you have any questions related to the use of the equipment or supplies? No  Functional Questionnaire: (I = Independent and D = Dependent) ADLs: independent   Follow up appointments reviewed:  PCP Hospital f/u appt confirmed? Yes  Scheduled to see [name], NP- 07/23/2022  Specialist Hospital f/u appt confirmed?  None scheduled yet. He said GI will be calling him with an appointment time    Are transportation arrangements needed? No  If their condition worsens, is the pt aware to call PCP or go to the Emergency Dept.? Yes Was the patient provided with contact information for the PCP's office or ED? Yes Was to pt encouraged to call back with questions or concerns? Yes

## 2022-07-23 ENCOUNTER — Inpatient Hospital Stay (INDEPENDENT_AMBULATORY_CARE_PROVIDER_SITE_OTHER): Payer: Self-pay | Admitting: Primary Care

## 2022-08-15 ENCOUNTER — Encounter (INDEPENDENT_AMBULATORY_CARE_PROVIDER_SITE_OTHER): Payer: Self-pay | Admitting: Primary Care

## 2022-08-15 ENCOUNTER — Ambulatory Visit (INDEPENDENT_AMBULATORY_CARE_PROVIDER_SITE_OTHER): Payer: Self-pay | Admitting: Primary Care

## 2022-08-15 VITALS — BP 142/102 | HR 94 | Resp 16 | Ht 68.0 in | Wt 147.4 lb

## 2022-08-15 DIAGNOSIS — M25531 Pain in right wrist: Secondary | ICD-10-CM

## 2022-08-15 DIAGNOSIS — I10 Essential (primary) hypertension: Secondary | ICD-10-CM

## 2022-08-15 DIAGNOSIS — Z0000 Encounter for general adult medical examination without abnormal findings: Secondary | ICD-10-CM

## 2022-08-15 DIAGNOSIS — Z13220 Encounter for screening for lipoid disorders: Secondary | ICD-10-CM

## 2022-08-15 DIAGNOSIS — Z131 Encounter for screening for diabetes mellitus: Secondary | ICD-10-CM

## 2022-08-15 DIAGNOSIS — M62838 Other muscle spasm: Secondary | ICD-10-CM

## 2022-08-15 DIAGNOSIS — Z09 Encounter for follow-up examination after completed treatment for conditions other than malignant neoplasm: Secondary | ICD-10-CM

## 2022-08-15 MED ORDER — CYCLOBENZAPRINE HCL 10 MG PO TABS: 10.0000 mg | ORAL_TABLET | Freq: Two times a day (BID) | ORAL | 1 refills | Status: DC | PRN

## 2022-08-15 MED ORDER — AMLODIPINE BESYLATE 10 MG PO TABS: 10.0000 mg | ORAL_TABLET | Freq: Every day | ORAL | 1 refills | Status: DC

## 2022-08-15 MED ORDER — VALSARTAN-HYDROCHLOROTHIAZIDE 160-25 MG PO TABS: 1.0000 | ORAL_TABLET | Freq: Every day | ORAL | 1 refills | Status: DC

## 2022-08-15 NOTE — Patient Instructions (Signed)
Valsartan; Hydrochlorothiazide Tablets What is this medication? VALSARTAN; HYDROCHLOROTHIAZIDE (val SAR tan; hye droe klor oh THYE a zide) treats high blood pressure. It relaxes your blood vessels and helps your kidneys remove more fluid through the urine, which lowers blood pressure. It is a combination of an ARB and diuretic. This medicine may be used for other purposes; ask your health care provider or pharmacist if you have questions. COMMON BRAND NAME(S): Diovan HCT What should I tell my care team before I take this medication? They need to know if you have any of these conditions: Diabetes Gout Heart failure Kidney disease Liver disease Lupus Pancreatitis An unusual or allergic reaction to valsartan, hydrochlorothiazide, sulfa medications, other medications, foods, dyes, or preservatives Pregnant or trying to get pregnant Breast-feeding How should I use this medication? Take this medication by mouth. Take it as directed on the prescription label at the same time every day. You can take it with or without food. If it upsets your stomach, take it with food. Keep taking it unless your care team tells you to stop. Talk to your care team about the use of this medication in children. Special care may be needed. Overdosage: If you think you have taken too much of this medicine contact a poison control center or emergency room at once. NOTE: This medicine is only for you. Do not share this medicine with others. What if I miss a dose? If you miss a dose, take it as soon as you can. If it is almost time for your next dose, take only that dose. Do not take double or extra doses. What may interact with this medication? Do not take this medication with any of the following: Cidofovir Dofetilide This medication may also interact with the following: Aliskiren ACE inhibitors, like enalapril or lisinopril Cholestyramine Colestipol Digoxin Lithium Medications for blood pressure Medications  for diabetes Medications that relax muscles for surgery NSAIDs, medications for pain and inflammation, like ibuprofen or naproxen Potassium salts or potassium supplements Other diuretics, especially triamterene, spironolactone, or amiloride Steroid medications like prednisone or cortisone This list may not describe all possible interactions. Give your health care provider a list of all the medicines, herbs, non-prescription drugs, or dietary supplements you use. Also tell them if you smoke, drink alcohol, or use illegal drugs. Some items may interact with your medicine. What should I watch for while using this medication? Visit your care team for regular check-ups. Check your blood pressure as directed. Ask your care team what your blood pressure should be. Also, find out when you should contact them. Do not treat yourself for coughs, colds, or pain while you are using this medication without asking your care team for advice. Some medications may increase your blood pressure. Women should inform their care team if they wish to become pregnant or think they might be pregnant. There is a potential for serious side effects to an unborn child. Talk to your care team for more information. You may get drowsy or dizzy. Do not drive, use machinery, or do anything that needs mental alertness until you know how this medication affects you. Do not stand or sit up quickly, especially if you are an older patient. This reduces the risk of dizzy or fainting spells. Alcohol can make you more drowsy and dizzy. Avoid alcoholic drinks. Talk to your care team about your risk of skin cancer. You may be more at risk for skin cancer if you take this medication. This medication can make you more sensitive   to the sun. Keep out of the sun. If you cannot avoid being in the sun, wear protective clothing and use sunscreen. Do not use sun lamps or tanning beds/booths. Avoid salt substitutes unless you are told otherwise by your  care team. You may need to be on a special diet while taking this medication. Ask your care team. Also, find out how many glasses of fluids you need to drink each day. Check with your care team if you get an attack of severe diarrhea, nausea and vomiting, or if you sweat a lot. The loss of too much body fluid can make it dangerous for you to take this medication. This medication may increase blood sugar. Ask your care team if changes in diet or medications are needed if you have diabetes. What side effects may I notice from receiving this medication? Side effects that you should report to your care team as soon as possible: Allergic reactions--skin rash, itching, hives, swelling of the face, lips, tongue, or throat Dehydration--increased thirst, dry mouth, feeling faint or lightheaded, headache, dark yellow or brown urine Gout--severe pain, redness, warmth, or swelling in joints, such as the big toe Kidney injury--decrease in the amount of urine, swelling of the ankles, hands, or feet Low blood pressure--dizziness, feeling faint or lightheaded, blurry vision Low potassium level--muscle pain or cramps, unusual weakness or fatigue, fast or irregular heartbeat, constipation Sudden eye pain or change in vision such as blurry vision, seeing halos around lights, vision loss Side effects that usually do not require medical attention (report to your care team if they continue or are bothersome): Change in sex drive or performance Dizziness Fatigue Headache Upset stomach This list may not describe all possible side effects. Call your doctor for medical advice about side effects. You may report side effects to FDA at 1-800-FDA-1088. Where should I keep my medication? Keep out of the reach of children and pets. Store at room temperature between 20 and 25 degrees C (68 and 77 degrees F). Protect from moisture. Keep the container tightly closed. Get rid of any unused medication after the expiration date. To  get rid of medications that are no longer needed or have expired: Take the medications to a medication take-back program. Check with your pharmacy or law enforcement to find a location. If you cannot return the medication, check the label or package insert to see if the medication should be thrown out in the garbage or flushed down the toilet. If you are not sure, ask your care team. If it is safe to put in the trash, empty the medication out of the container. Mix the medication with cat litter, dirt, coffee grounds, or other unwanted substance. Seal the mixture in a bag or container. Put it in the trash. NOTE: This sheet is a summary. It may not cover all possible information. If you have questions about this medicine, talk to your doctor, pharmacist, or health care provider.  2023 Elsevier/Gold Standard (2007-12-13 00:00:00)  

## 2022-08-15 NOTE — Progress Notes (Signed)
[facility]   Subjective:   Victor Johnson is a 46 y.o. male presents for hospital follow up .  Patient presenting with abdominal pain. He reported that he woke up with N/V . This continued through the day the abdominal cramping.  His wife thought he might need a muscle relaxer, so she gave a panel him one of hers. However, she mistakenly gave him her DM meds. After that, his symptoms worsened. He then decided to come to the ED for evaluation.Admit date to the hospital was 07/08/22, patient was discharged from the hospital on 07/11/22, patient was admitted for: AKI (acute kidney injury). Today he is feeling much better except voices concerns with right elbow popping unable reduplicate popping. He is asking for muscle relaxer for muscle spasm in his leg.  Past Medical History:  Diagnosis Date   Gastritis    Hypertension      No Known Allergies   Current Outpatient Medications on File Prior to Visit  Medication Sig Dispense Refill   dicyclomine (BENTYL) 10 MG capsule Take 1 capsule (10 mg total) by mouth 3 (three) times daily as needed for spasms (lower abdominal cramping). (Patient not taking: Reported on 08/15/2022) 90 capsule 0   ondansetron (ZOFRAN) 4 MG tablet Take 1 tablet (4 mg total) by mouth every 6 (six) hours as needed for nausea. (Patient not taking: Reported on 08/15/2022) 20 tablet 0   No current facility-administered medications on file prior to visit.     Review of System: Comprehensive ROS Pertinent positive and negative noted in HPI    Objective:  BP (!) 142/102   Pulse 94   Resp 16   Ht 5\' 8"  (1.727 m)   Wt 147 lb 6.4 oz (66.9 kg)   SpO2 98%   BMI 22.41 kg/m   Filed Weights   08/15/22 0935  Weight: 147 lb 6.4 oz (66.9 kg)    Physical Exam: General Appearance: Well nourished, in no apparent distress. Eyes: PERRLA, EOMs, conjunctiva no swelling or erythema Sinuses: No Frontal/maxillary tenderness ENT/Mouth: Ext aud canals clear, TMs without  erythema, bulging. No erythema, swelling, or exudate on post pharynx.  Tonsils not swollen or erythematous. Hearing normal.  Neck: Supple, thyroid normal.  Respiratory: Respiratory effort normal, BS equal bilaterally without rales, rhonchi, wheezing or stridor.  Cardio: RRR with no MRGs. Brisk peripheral pulses without edema.  Abdomen: Soft, + BS.  Non tender, no guarding, rebound, hernias, masses. Lymphatics: Non tender without lymphadenopathy.  Musculoskeletal: Full ROM, 5/5 strength, normal gait.  Skin: Warm, dry without rashes, lesions, ecchymosis.  Neuro: Cranial nerves intact. Normal muscle tone, no cerebellar symptoms. Sensation intact.  Psych: Awake and oriented X 3, normal affect, Insight and Judgment appropriate.    Assessment:  Victor Johnson was seen today for hospitalization follow-up.  Diagnoses and all orders for this visit:  Essential hypertension BP goal - < 130/80 Explained that having normal blood pressure is the goal and medications are helping to get to goal and maintain normal blood pressure. DIET: Limit salt intake, read nutrition labels to check salt content, limit fried and high fatty foods  Avoid using multisymptom OTC cold preparations that generally contain sudafed which can rise BP. Consult with pharmacist on best cold relief products to use for persons with HTN EXERCISE Discussed incorporating exercise such as walking - 30 minutes most days of the week and can do in 10 minute intervals    Blood pressure uncontrolled discontinue hydrochlorothiazide 25 mg changed to valsartan/HCTZ and continue-  amLODipine (NORVASC) 10 MG tablet; Take 1 tablet (10 mg total) by mouth daily. -     valsartan-hydrochlorothiazide (DIOVAN-HCT) 160-25 MG tablet; Take 1 tablet by mouth daily.  Right wrist pain -     cyclobenzaprine (FLEXERIL) 10 MG tablet; Take 1 tablet (10 mg total) by mouth 2 (two) times daily as needed for muscle spasms.  Muscle spasm of both lower legs -      cyclobenzaprine (FLEXERIL) 10 MG tablet; Take 1 tablet (10 mg total) by mouth 2 (two) times daily as needed for muscle spasms.  Healthcare maintenance -     HCV Ab w Reflex to Quant PCR  Diabetes mellitus screening -     Hemoglobin A1c  Lipid screening -     Lipid panel  Hospital discharge follow-up Patient has scheduled an appointment with Otis Brace, MD (Gastroenterology Patient saw PCP for follow-up medication changes   Meds ordered this encounter  Medications   cyclobenzaprine (FLEXERIL) 10 MG tablet    Sig: Take 1 tablet (10 mg total) by mouth 2 (two) times daily as needed for muscle spasms.    Dispense:  60 tablet    Refill:  1    Order Specific Question:   Supervising Provider    Answer:   Tresa Garter [[id number]]   amLODipine (NORVASC) 10 MG tablet    Sig: Take 1 tablet (10 mg total) by mouth daily.    Dispense:  90 tablet    Refill:  1    Order Specific Question:   Supervising Provider    Answer:   Tresa Garter [[id number]]   valsartan-hydrochlorothiazide (DIOVAN-HCT) 160-25 MG tablet    Sig: Take 1 tablet by mouth daily.    Dispense:  90 tablet    Refill:  1    Order Specific Question:   Supervising Provider    Answer:   Tresa Garter [[id number]]    This note has been created with Dragon speech recognition software and smart phrase technology. Any transcriptional errors are unintentional.   Kerin Perna, NP 08/15/2022, 10:05 AM

## 2022-09-12 ENCOUNTER — Ambulatory Visit (INDEPENDENT_AMBULATORY_CARE_PROVIDER_SITE_OTHER): Payer: Medicaid Other | Admitting: Primary Care

## 2022-10-26 ENCOUNTER — Emergency Department (HOSPITAL_COMMUNITY)
Admission: EM | Admit: 2022-10-26 | Discharge: 2022-10-26 | Disposition: A | Discharge status: Home / Self Care | Payer: Medicaid Other | Attending: Emergency Medicine | Admitting: Emergency Medicine

## 2022-10-26 ENCOUNTER — Other Ambulatory Visit: Payer: Self-pay

## 2022-10-26 DIAGNOSIS — M109 Gout, unspecified: Secondary | ICD-10-CM | POA: Insufficient documentation

## 2022-10-26 DIAGNOSIS — I10 Essential (primary) hypertension: Secondary | ICD-10-CM | POA: Insufficient documentation

## 2022-10-26 DIAGNOSIS — Z79899 Other long term (current) drug therapy: Secondary | ICD-10-CM | POA: Insufficient documentation

## 2022-10-26 MED ORDER — DEXAMETHASONE SODIUM PHOSPHATE 10 MG/ML IJ SOLN
8.0000 mg | Freq: Once | INTRAMUSCULAR | Status: AC
  Administered 2022-10-26: 8 mg via INTRAMUSCULAR
  Filled 2022-10-26: qty 1

## 2022-10-26 NOTE — Discharge Instructions (Signed)
Thank you for letting us take care of you today.  Please see attached information for information regarding gout and a diet plan to help prevent future attacks.   We gave you a steroid shot in the ED to help control the pain and inflammation in your foot. Unfortunately, you have a history of elevated kidney function so I do not want to send you home with medication such as NSAIDs as this can permanently damage your kidneys.  I strongly recommend you follow-up with your primary care provider in the next week for reassessment or earlier if the medication you received in the ED today does not help your symptoms.  If you develop fever, chills, shortness of breath, or other concerns, you may return to ED at any time for re-evaluation.

## 2022-10-26 NOTE — ED Triage Notes (Signed)
Patient c/o right foot pain last Wednesday.  Patient report increase swelling and pain on his first toe.

## 2022-10-26 NOTE — ED Provider Notes (Signed)
[facility]-EMERGENCY DEPT Provider Note   CSN: [id number] Arrival date & time: 10/26/22  1455     History  Chief Complaint  Patient presents with   right foot pain    Victor Johnson is a 46 y.o. male with past medical history pretension who presents to the ED complaining of right great toe pain for the last 2 days.  Patient states that he noticed pain to the DIP joint that was followed by redness, warmth, increased pain, and swelling.  He denies fever, chills, shortness of breath, calf pain or swelling, fall or injury to foot, open wound, chest pain, abdominal pain, nausea, vomiting, pain to the ankle or higher on the leg, insect bite, or history of similar symptoms. He denies IV drug use. He does drink alcohol daily. He is able to ambulate though does have increased pain to DIP joint with ambulation. He denies previous injury to foot.       Home Medications Prior to Admission medications   Medication Sig Start Date End Date Taking? Authorizing Provider  amLODipine (NORVASC) 10 MG tablet Take 1 tablet (10 mg total) by mouth daily. 08/15/22   Grayce Sessions, NP  cyclobenzaprine (FLEXERIL) 10 MG tablet Take 1 tablet (10 mg total) by mouth 2 (two) times daily as needed for muscle spasms. 08/15/22   Grayce Sessions, NP  dicyclomine (BENTYL) 10 MG capsule Take 1 capsule (10 mg total) by mouth 3 (three) times daily as needed for spasms (lower abdominal cramping). Patient not taking: Reported on 08/15/2022 07/11/22   Noralee Stain, DO  ondansetron (ZOFRAN) 4 MG tablet Take 1 tablet (4 mg total) by mouth every 6 (six) hours as needed for nausea. Patient not taking: Reported on 08/15/2022 07/11/22   Noralee Stain, DO  valsartan-hydrochlorothiazide (DIOVAN-HCT) 160-25 MG tablet Take 1 tablet by mouth daily. 08/15/22   Grayce Sessions, NP      Allergies    Patient has no known allergies.    Review of Systems   Review of Systems  Constitutional:  Negative for  chills and fever.  HENT:  Negative for ear pain and sore throat.   Eyes:  Negative for pain and visual disturbance.  Respiratory:  Negative for cough and shortness of breath.   Cardiovascular:  Negative for chest pain and palpitations.  Gastrointestinal:  Negative for abdominal pain and vomiting.  Genitourinary:  Negative for dysuria and hematuria.  Musculoskeletal:  Positive for joint swelling. Negative for back pain and gait problem.  Skin:  Negative for color change and rash.  Neurological:  Negative for seizures, syncope and numbness.  All other systems reviewed and are negative.   Physical Exam Updated Vital Signs BP (!) 144/101   Pulse 99   Temp 98 F (36.7 C)   Resp 16   SpO2 99%  Physical Exam Vitals and nursing note reviewed.  Constitutional:      General: He is not in acute distress.    Appearance: Normal appearance. He is not ill-appearing or toxic-appearing.  HENT:     Head: Normocephalic and atraumatic.     Mouth/Throat:     Mouth: Mucous membranes are moist.  Eyes:     Conjunctiva/sclera: Conjunctivae normal.  Cardiovascular:     Rate and Rhythm: Normal rate and regular rhythm.     Heart sounds: Normal heart sounds. No murmur heard. Pulmonary:     Effort: Pulmonary effort is normal. No respiratory distress.     Breath sounds: Normal breath sounds. No  wheezing, rhonchi or rales.  Abdominal:     General: Abdomen is flat.     Palpations: Abdomen is soft.     Tenderness: There is no abdominal tenderness.  Musculoskeletal:     Cervical back: Neck supple.     Comments: Moderate swelling to right first DIP joint with overlying increased warmth, erythema, and significant tenderness to light palpation, range of motion of DIP joint slightly limited secondary to swelling and pain with remainder of range of motion intact, neurovascularly intact, no calf tenderness or swelling, no streaking erythema  Skin:    General: Skin is warm and dry.     Capillary Refill:  Capillary refill takes less than 2 seconds.  Neurological:     Mental Status: He is alert. Mental status is at baseline.  Psychiatric:        Behavior: Behavior normal.     ED Results / Procedures / Treatments   Labs (all labs ordered are listed, but only abnormal results are displayed) Labs Reviewed - No data to display  EKG None  Radiology None  Procedures None  Medications Ordered in ED Medications  dexamethasone (DECADRON) injection 8 mg (8 mg Intramuscular Given 10/26/22 1845)    ED Course/ Medical Decision Making/ A&P                           Medical Decision Making Risk Prescription drug management.   46 year old male with PMH HTN presents to ED with symptoms consistent with acute gout attack of right first DIP joint. Pt has exquisite tenderness with overlying swelling, warmth, and erythema. He has had no injury to foot to suggest acute fracture or dislocation and he has had no systemic symptoms to suggest acute systemic infection or septic joint. Unfortunately, pt has a history of elevated kidney function so will not give NSAIDs. Dose of Decadron ordered in ED for symptomatic treatment and pt given strict instruction to follow-up with PCP in the next week for reassessment or sooner if symptoms do not improve with steroids given today. Strict return precautions given for fever, decreased range of motion, streaking erythema, shortness of breath, or other concerns. Diet and alcohol counseling provided with resources for diet changes that may help prevent future gout attacks. Pt understood and agreed with plan. All questions answered.          Final Clinical Impression(s) / ED Diagnoses Final diagnoses:  Acute gout involving toe of right foot, unspecified cause    Rx / DC Orders ED Discharge Orders     None         Richardson Dopp 10/26/22 1849    Arby Barrette, MD 11/07/22 1524

## 2022-11-22 ENCOUNTER — Inpatient Hospital Stay (INDEPENDENT_AMBULATORY_CARE_PROVIDER_SITE_OTHER): Payer: Self-pay | Admitting: Primary Care

## 2022-12-11 ENCOUNTER — Encounter (INDEPENDENT_AMBULATORY_CARE_PROVIDER_SITE_OTHER): Payer: Self-pay | Admitting: Primary Care

## 2022-12-11 ENCOUNTER — Ambulatory Visit (INDEPENDENT_AMBULATORY_CARE_PROVIDER_SITE_OTHER): Payer: Medicaid Other | Admitting: Primary Care

## 2022-12-11 VITALS — BP 122/83 | HR 77 | Resp 16 | Ht 68.0 in | Wt 149.4 lb

## 2022-12-11 DIAGNOSIS — M62838 Other muscle spasm: Secondary | ICD-10-CM | POA: Diagnosis not present

## 2022-12-11 DIAGNOSIS — M1A9XX0 Chronic gout, unspecified, without tophus (tophi): Secondary | ICD-10-CM

## 2022-12-11 DIAGNOSIS — M25531 Pain in right wrist: Secondary | ICD-10-CM | POA: Diagnosis not present

## 2022-12-11 DIAGNOSIS — I10 Essential (primary) hypertension: Secondary | ICD-10-CM | POA: Diagnosis not present

## 2022-12-11 MED ORDER — AMLODIPINE BESYLATE 10 MG PO TABS: 10.0000 mg | ORAL_TABLET | Freq: Every day | ORAL | 1 refills | Status: DC

## 2022-12-11 MED ORDER — ALLOPURINOL 100 MG PO TABS
100.0000 mg | ORAL_TABLET | Freq: Every day | ORAL | 1 refills | Status: DC
Start: 2022-12-11 — End: 2023-12-07

## 2022-12-11 MED ORDER — CYCLOBENZAPRINE HCL 10 MG PO TABS: 10.0000 mg | ORAL_TABLET | Freq: Two times a day (BID) | ORAL | 1 refills | Status: DC | PRN

## 2022-12-11 MED ORDER — VALSARTAN-HYDROCHLOROTHIAZIDE 160-25 MG PO TABS: 1.0000 | ORAL_TABLET | Freq: Every day | ORAL | 1 refills | Status: DC

## 2022-12-11 NOTE — Patient Instructions (Signed)
Gout  Gout is a condition that causes painful swelling of the joints. Gout is a type of inflammation of the joints (arthritis). This condition is caused by having too much uric acid in the body. Uric acid is a chemical that forms when the body breaks down substances called purines. Purines are important for building body proteins. When the body has too much uric acid, sharp crystals can form and build up inside the joints. This causes pain and swelling. Gout attacks can happen quickly and may be very painful (acute gout). Over time, the attacks can affect more joints and become more frequent (chronic gout). Gout can also cause uric acid to build up under the skin and inside the kidneys. What are the causes? This condition is caused by too much uric acid in your blood. This can happen because: Your kidneys do not remove enough uric acid from your blood. This is the most common cause. Your body makes too much uric acid. This can happen with some cancers and cancer treatments. It can also occur if your body is breaking down too many red blood cells (hemolytic anemia). You eat too many foods that are high in purines. These foods include organ meats and some seafood. Alcohol, especially beer, is also high in purines. A gout attack may be triggered by trauma or stress. What increases the risk? The following factors may make you more likely to develop this condition: Having a family history of gout. Being male and middle-aged. Being male and having gone through menopause. Taking certain medicines, including aspirin, cyclosporine, diuretics, levodopa, and niacin. Having an organ transplant. Having certain conditions, such as: Being obese. Lead poisoning. Kidney disease. A skin condition called psoriasis. Other factors include: Losing weight too quickly. Being dehydrated. Frequently drinking alcohol, especially beer. Frequently drinking beverages that are sweetened with a type of sugar called  fructose. What are the signs or symptoms? An attack of acute gout happens quickly. It usually occurs in just one joint. The most common place is the big toe. Attacks often start at night. Other joints that may be affected include joints of the feet, ankle, knee, fingers, wrist, or elbow. Symptoms of this condition may include: Severe pain. Warmth. Swelling. Stiffness. Tenderness. The affected joint may be very painful to touch. Shiny, red, or purple skin. Chills and fever. Chronic gout may cause symptoms more frequently. More joints may be involved. You may also have white or yellow lumps (tophi) on your hands or feet or in other areas near your joints. How is this diagnosed? This condition is diagnosed based on your symptoms, your medical history, and a physical exam. You may have tests, such as: Blood tests to measure uric acid levels. Removal of joint fluid with a thin needle (aspiration) to look for uric acid crystals. X-rays to look for joint damage. How is this treated? Treatment for this condition has two phases: treating an acute attack and preventing future attacks. Acute gout treatment may include medicines to reduce pain and swelling, including: NSAIDs, such as ibuprofen. Steroids. These are strong anti-inflammatory medicines that can be taken by mouth (orally) or injected into a joint. Colchicine. This medicine relieves pain and swelling when it is taken soon after an attack. It can be given by mouth or through an IV. Preventive treatment may include: Daily use of smaller doses of NSAIDs or colchicine. Use of a medicine that reduces uric acid levels in your blood, such as allopurinol. Changes to your diet. You may need to see   a dietitian about what to eat and drink to prevent gout. Follow these instructions at home: During a gout attack  If directed, put ice on the affected area. To do this: Put ice in a plastic bag. Place a towel between your skin and the bag. Leave the  ice on for 20 minutes, 2-3 times a day. Remove the ice if your skin turns bright red. This is very important. If you cannot feel pain, heat, or cold, you have a greater risk of damage to the area. Raise (elevate) the affected joint above the level of your heart as often as possible. Rest the joint as much as possible. If the affected joint is in your leg, you may be given crutches to use. Follow instructions from your health care provider about eating or drinking restrictions. Avoiding future gout attacks Follow a low-purine diet as told by your dietitian or health care provider. Avoid foods and drinks that are high in purines, including liver, kidney, anchovies, asparagus, herring, mushrooms, mussels, and beer. Maintain a healthy weight or lose weight if you are overweight. If you want to lose weight, talk with your health care provider. Do not lose weight too quickly. Start or maintain an exercise program as told by your health care provider. Eating and drinking Avoid drinking beverages that contain fructose. Drink enough fluids to keep your urine pale yellow. If you drink alcohol: Limit how much you have to: 0-1 drink a day for women who are not pregnant. 0-2 drinks a day for men. Know how much alcohol is in a drink. In the [place] one drink equals one 12 oz bottle of beer (355 mL), one 5 oz glass of wine (148 mL), or one 1 oz glass of hard liquor (44 mL). General instructions Take over-the-counter and prescription medicines only as told by your health care provider. Ask your health care provider if the medicine prescribed to you requires you to avoid driving or using machinery. Return to your normal activities as told by your health care provider. Ask your health care provider what activities are safe for you. Keep all follow-up visits. This is important. Where to find more information National Institutes of Health: www.niams.nih.gov Contact a health care provider if you have: Another  gout attack. Continuing symptoms of a gout attack after 10 days of treatment. Side effects from your medicines. Chills or a fever. Burning pain when you urinate. Pain in your lower back or abdomen. Get help right away if you: Have severe or uncontrolled pain. Cannot urinate. Summary Gout is painful swelling of the joints caused by having too much uric acid in the body. The most common site for gout to occur is in the big toe, but it can affect other joints in the body. Medicines and dietary changes can help to prevent and treat gout attacks. This information is not intended to replace advice given to you by your health care provider. Make sure you discuss any questions you have with your health care provider. Document Revised: 07/26/2021 Document Reviewed: 07/26/2021 Elsevier Patient Education  2023 [employer] Inc.  

## 2022-12-11 NOTE — Progress Notes (Signed)
[facility]   Subjective:   Victor Johnson is a 47 y.o. male presents for ER follow-up. Seen on 10/26/22, patient was discharged from the ER on 10/26/22 per ED "Patient states that he noticed pain to the DIP joint that was followed by redness, warmth, increased pain, and swelling." Patient was seen for: Acute gout involving toe of right foot, unspecified cause. Today he continues to complain of gout symptoms. He admits to consumption of alcohol, shrimp occasionally, and red meat. Patient also reports he is out of BP meds.    Past Medical History:  Diagnosis Date   Gastritis    Hypertension      No Known Allergies    Current Outpatient Medications on File Prior to Visit  Medication Sig Dispense Refill   amLODipine (NORVASC) 10 MG tablet Take 1 tablet (10 mg total) by mouth daily. 90 tablet 1   cyclobenzaprine (FLEXERIL) 10 MG tablet Take 1 tablet (10 mg total) by mouth 2 (two) times daily as needed for muscle spasms. 60 tablet 1   valsartan-hydrochlorothiazide (DIOVAN-HCT) 160-25 MG tablet Take 1 tablet by mouth daily. 90 tablet 1   dicyclomine (BENTYL) 10 MG capsule Take 1 capsule (10 mg total) by mouth 3 (three) times daily as needed for spasms (lower abdominal cramping). (Patient not taking: Reported on 08/15/2022) 90 capsule 0   ondansetron (ZOFRAN) 4 MG tablet Take 1 tablet (4 mg total) by mouth every 6 (six) hours as needed for nausea. (Patient not taking: Reported on 08/15/2022) 20 tablet 0   No current facility-administered medications on file prior to visit.     Review of System: ROS Comprehensive ROS Pertinent positive and negative noted in HPI   Objective:  Blood Pressure 122/83   Pulse 77   Respiration 16   Height 5\' 8"  (1.727 m)   Weight 149 lb 6.4 oz (67.8 kg)   Oxygen Saturation 97%   Body Mass Index 22.72 kg/m   Filed Weights   12/11/22 1444  Weight: 149 lb 6.4 oz (67.8 kg)    Physical Exam:  General Appearance: Well nourished, in no  apparent distress. Eyes: PERRLA, EOMs, conjunctiva no swelling or erythema Sinuses: No Frontal/maxillary tenderness ENT/Mouth: Ext aud canals clear, TMs without erythema, bulging. No erythema, swelling, or exudate on post pharynx.  Tonsils not swollen or erythematous. Hearing normal.  Neck: Supple, thyroid normal.  Respiratory: Respiratory effort normal, BS equal bilaterally without rales, rhonchi, wheezing or stridor.  Cardio: RRR with no MRGs. Brisk peripheral pulses without edema.  Abdomen: Soft, + BS.  Non tender, no guarding, rebound, hernias, masses. Lymphatics: Non tender without lymphadenopathy.  Musculoskeletal: Full ROM, 5/5 strength, normal gait.  Skin: Warm, dry without rashes, lesions, ecchymosis.  Neuro: Cranial nerves intact. Normal muscle tone, no cerebellar symptoms. Sensation intact.  Psych: Awake and oriented X 3, normal affect, Insight and Judgment appropriate.    Assessment:  [name] was seen today for hospitalization follow-up.  Diagnoses and all orders for this visit:  Chronic gout involving toe of right foot without tophus, unspecified cause      - prescribed allopurinol for maintenance. Advised to modify diet, to limit consumption of alcohol, red meats,    and seafood.  Essential hypertension - well controlled  -     amLODipine (NORVASC) 10 MG tablet; Take 1 tablet (10 mg total) by mouth daily. - refilled -     valsartan-hydrochlorothiazide (DIOVAN-HCT) 160-25 MG tablet; Take 1 tablet by mouth daily. - refilled  Right wrist pain -  cyclobenzaprine (FLEXERIL) 10 MG tablet; Take 1 tablet (10 mg total) by mouth 2 (two) times daily as needed for muscle spasms. - refilled  Muscle spasm of both lower legs -     cyclobenzaprine (FLEXERIL) 10 MG tablet; Take 1 tablet (10 mg total) by mouth 2 (two) times daily as needed for muscle spasms.   Other orders -     allopurinol (ZYLOPRIM) 100 MG tablet; Take 1 tablet (100 mg total) by mouth daily.     This note has  been created with Surveyor, quantity. Any transcriptional errors are unintentional.   Kerin Perna, NP 12/11/2022, 2:49 PM

## 2022-12-27 ENCOUNTER — Other Ambulatory Visit: Payer: Self-pay

## 2022-12-27 ENCOUNTER — Encounter (HOSPITAL_COMMUNITY): Payer: Self-pay | Admitting: Emergency Medicine

## 2022-12-27 ENCOUNTER — Emergency Department (HOSPITAL_COMMUNITY)
Admission: EM | Admit: 2022-12-27 | Discharge: 2022-12-27 | Disposition: A | Discharge status: Home / Self Care | Payer: Medicaid Other | Attending: Emergency Medicine | Admitting: Emergency Medicine

## 2022-12-27 ENCOUNTER — Emergency Department (HOSPITAL_COMMUNITY): Payer: Medicaid Other

## 2022-12-27 DIAGNOSIS — E876 Hypokalemia: Secondary | ICD-10-CM | POA: Diagnosis not present

## 2022-12-27 DIAGNOSIS — R1032 Left lower quadrant pain: Secondary | ICD-10-CM | POA: Insufficient documentation

## 2022-12-27 DIAGNOSIS — D72829 Elevated white blood cell count, unspecified: Secondary | ICD-10-CM | POA: Insufficient documentation

## 2022-12-27 DIAGNOSIS — R197 Diarrhea, unspecified: Secondary | ICD-10-CM | POA: Insufficient documentation

## 2022-12-27 DIAGNOSIS — R112 Nausea with vomiting, unspecified: Secondary | ICD-10-CM | POA: Insufficient documentation

## 2022-12-27 LAB — CBC WITH DIFFERENTIAL/PLATELET
Abs Immature Granulocytes: 0.03 10*3/uL (ref 0.00–0.07)
Basophils Absolute: 0.1 10*3/uL (ref 0.0–0.1)
Basophils Relative: 1 %
Eosinophils Absolute: 0.1 10*3/uL (ref 0.0–0.5)
Eosinophils Relative: 1 %
HCT: 42.2 % (ref 39.0–52.0)
Hemoglobin: 14.2 g/dL (ref 13.0–17.0)
Immature Granulocytes: 0 %
Lymphocytes Relative: 19 %
Lymphs Abs: 2.1 10*3/uL (ref 0.7–4.0)
MCH: 31.8 pg (ref 26.0–34.0)
MCHC: 33.6 g/dL (ref 30.0–36.0)
MCV: 94.4 fL (ref 80.0–100.0)
Monocytes Absolute: 1.2 10*3/uL — ABNORMAL HIGH (ref 0.1–1.0)
Monocytes Relative: 11 %
Neutro Abs: 7.5 10*3/uL (ref 1.7–7.7)
Neutrophils Relative %: 68 %
Platelets: 351 10*3/uL (ref 150–400)
RBC: 4.47 MIL/uL (ref 4.22–5.81)
RDW: 13.5 % (ref 11.5–15.5)
WBC: 11 10*3/uL — ABNORMAL HIGH (ref 4.0–10.5)
nRBC: 0 % (ref 0.0–0.2)

## 2022-12-27 LAB — URINALYSIS, ROUTINE W REFLEX MICROSCOPIC
Bacteria, UA: NONE SEEN
Glucose, UA: NEGATIVE mg/dL
Hgb urine dipstick: NEGATIVE
Ketones, ur: NEGATIVE mg/dL
Leukocytes,Ua: NEGATIVE
Nitrite: NEGATIVE
Protein, ur: 100 mg/dL — AB
Specific Gravity, Urine: 1.036 — ABNORMAL HIGH (ref 1.005–1.030)
pH: 5 (ref 5.0–8.0)

## 2022-12-27 LAB — COMPREHENSIVE METABOLIC PANEL
ALT: 27 U/L (ref 0–44)
AST: 16 U/L (ref 15–41)
Albumin: 4.5 g/dL (ref 3.5–5.0)
Alkaline Phosphatase: 57 U/L (ref 38–126)
Anion gap: 13 (ref 5–15)
BUN: 16 mg/dL (ref 6–20)
CO2: 27 mmol/L (ref 22–32)
Calcium: 9.4 mg/dL (ref 8.9–10.3)
Chloride: 103 mmol/L (ref 98–111)
Creatinine, Ser: 1.07 mg/dL (ref 0.61–1.24)
GFR, Estimated: >60 mL/min (ref >60)
Glucose, Bld: 117 mg/dL — ABNORMAL HIGH (ref 70–99)
Potassium: 3 mmol/L — ABNORMAL LOW (ref 3.5–5.1)
Sodium: 143 mmol/L (ref 135–145)
Total Bilirubin: 1.7 mg/dL — ABNORMAL HIGH (ref 0.3–1.2)
Total Protein: 8 g/dL (ref 6.5–8.1)

## 2022-12-27 LAB — I-STAT CHEM 8, ED
BUN: 15 mg/dL (ref 6–20)
Calcium, Ion: 1.15 mmol/L (ref 1.15–1.40)
Chloride: 101 mmol/L (ref 98–111)
Creatinine, Ser: 1 mg/dL (ref 0.61–1.24)
Glucose, Bld: 119 mg/dL — ABNORMAL HIGH (ref 70–99)
HCT: 44 % (ref 39.0–52.0)
Hemoglobin: 15 g/dL (ref 13.0–17.0)
Potassium: 3 mmol/L — ABNORMAL LOW (ref 3.5–5.1)
Sodium: 144 mmol/L (ref 135–145)
TCO2: 28 mmol/L (ref 22–32)

## 2022-12-27 LAB — LIPASE, BLOOD: Lipase: 35 U/L (ref 11–51)

## 2022-12-27 MED ORDER — IOHEXOL 300 MG/ML  SOLN
100.0000 mL | Freq: Once | INTRAMUSCULAR | Status: AC | PRN
  Administered 2022-12-27: 100 mL via INTRAVENOUS

## 2022-12-27 MED ORDER — ONDANSETRON HCL 4 MG/2ML IJ SOLN
4.0000 mg | Freq: Once | INTRAMUSCULAR | Status: AC
  Administered 2022-12-27: 4 mg via INTRAVENOUS
  Filled 2022-12-27: qty 2

## 2022-12-27 MED ORDER — POTASSIUM CHLORIDE CRYS ER 20 MEQ PO TBCR: 20.0000 meq | EXTENDED_RELEASE_TABLET | Freq: Two times a day (BID) | ORAL | 0 refills | Status: DC

## 2022-12-27 MED ORDER — FENTANYL CITRATE PF 50 MCG/ML IJ SOSY
25.0000 ug | PREFILLED_SYRINGE | Freq: Once | INTRAMUSCULAR | Status: AC
  Administered 2022-12-27: 25 ug via INTRAVENOUS
  Filled 2022-12-27: qty 1

## 2022-12-27 MED ORDER — ONDANSETRON 4 MG PO TBDP: 4.0000 mg | ORAL_TABLET | ORAL | 0 refills | Status: DC | PRN

## 2022-12-27 MED ORDER — LACTATED RINGERS IV BOLUS
1000.0000 mL | Freq: Once | INTRAVENOUS | Status: AC
  Administered 2022-12-27: 1000 mL via INTRAVENOUS

## 2022-12-27 MED ORDER — POTASSIUM CHLORIDE IN NACL 20-0.9 MEQ/L-% IV SOLN
Freq: Once | INTRAVENOUS | Status: DC
  Filled 2022-12-27: qty 1000

## 2022-12-27 NOTE — ED Provider Notes (Signed)
[facility] Provider Note   CSN: [id number] Arrival date & time: 12/27/22  1422     History  Chief Complaint  Patient presents with   Emesis   Diarrhea    Victor Johnson is a 47 y.o. male.  HPI Patient presents with concern of left lower quadrant abdominal pain nausea, vomiting, diarrhea.  He notes that he has had similar episodes though not for possibly few months.  Pain is focally in the left lower quadrant, severe, and he has been intolerant liquid solids, in spite of attempts to resuscitate orally with Pedialyte, Gatorade, water.  No history of abdominal surgery.     Home Medications Prior to Admission medications   Medication Sig Start Date End Date Taking? Authorizing Provider  allopurinol (ZYLOPRIM) 100 MG tablet Take 1 tablet (100 mg total) by mouth daily. 12/11/22   Kerin Perna, NP  amLODipine (NORVASC) 10 MG tablet Take 1 tablet (10 mg total) by mouth daily. 12/11/22   Kerin Perna, NP  cyclobenzaprine (FLEXERIL) 10 MG tablet Take 1 tablet (10 mg total) by mouth 2 (two) times daily as needed for muscle spasms. 12/11/22   Kerin Perna, NP  valsartan-hydrochlorothiazide (DIOVAN-HCT) 160-25 MG tablet Take 1 tablet by mouth daily. 12/11/22   Kerin Perna, NP      Allergies    Patient has no known allergies.    Review of Systems   Review of Systems  All other systems reviewed and are negative.   Physical Exam Updated Vital Signs BP (!) 133/107 (BP Location: Left Arm)   Pulse 90   Temp 98.6 F (37 C) (Oral)   Resp 18   Ht 5' 8"$  (1.727 m)   Wt 65.8 kg   SpO2 97%   BMI 22.05 kg/m  Physical Exam Vitals and nursing note reviewed.  Constitutional:      General: He is not in acute distress.    Appearance: He is well-developed.  HENT:     Head: Normocephalic and atraumatic.  Eyes:     Conjunctiva/sclera: Conjunctivae normal.  Cardiovascular:     Rate and Rhythm: Normal rate and regular  rhythm.  Pulmonary:     Effort: Pulmonary effort is normal. No respiratory distress.     Breath sounds: No stridor.  Abdominal:     General: There is no distension.    Skin:    General: Skin is warm and dry.  Neurological:     Mental Status: He is alert and oriented to person, place, and time.     ED Results / Procedures / Treatments   Labs (all labs ordered are listed, but only abnormal results are displayed) Labs Reviewed  COMPREHENSIVE METABOLIC PANEL - Abnormal; Notable for the following components:      Result Value   Potassium 3.0 (*)    Glucose, Bld 117 (*)    Total Bilirubin 1.7 (*)    All other components within normal limits  CBC WITH DIFFERENTIAL/PLATELET - Abnormal; Notable for the following components:   WBC 11.0 (*)    Monocytes Absolute 1.2 (*)    All other components within normal limits  I-STAT CHEM 8, ED - Abnormal; Notable for the following components:   Potassium 3.0 (*)    Glucose, Bld 119 (*)    All other components within normal limits  LIPASE, BLOOD  URINALYSIS, ROUTINE W REFLEX MICROSCOPIC    EKG None  Radiology No results found.  Procedures Procedures  Medications Ordered in ED Medications  0.9 % NaCl with KCl 20 mEq/ L  infusion (has no administration in time range)  fentaNYL (SUBLIMAZE) injection 25 mcg (25 mcg Intravenous Given 12/27/22 1529)  ondansetron (ZOFRAN) injection 4 mg (4 mg Intravenous Given 12/27/22 1531)  lactated ringers bolus 1,000 mL (1,000 mLs Intravenous New Bag/Given 12/27/22 1541)    ED Course/ Medical Decision Making/ A&P                             Medical Decision Making Old male with history of prior GI illnesses presents with left lower quadrant abdominal pain nausea, vomiting, diarrhea.  Concern for infection versus diverticulitis versus abscess, bacteremia, sepsis, less likely urinary tract dysfunction given his denial of symptoms.  CT labs ordered. Fluids fentanyl and Zofran ordered.  Cardiac 90  sinus normal Pulse ox 99% room air normal   Amount and/or Complexity of Data Reviewed External Data Reviewed: notes.    Details: Admission last fall for abdominal pain. Labs: ordered. Decision-making details documented in ED Course. Radiology: ordered and independent interpretation performed. Decision-making details documented in ED Course.  Risk Prescription drug management.   4:34 PM Patient in similar condition.  Initial labs notable for hypokalemia, otherwise somewhat reassuring, no anion gap.  Patient is awaiting CT scan on signout.  In essence this adult male presents with left lower quadrant abdominal pain nausea, vomiting, diarrhea.  Differential as above.  Initial findings consistent with hypokalemia, possibly secondary to his vomiting, minimal leukocytosis, no other substantial electrolyte abnormalities.        Final Clinical Impression(s) / ED Diagnoses Final diagnoses:  Nausea vomiting and diarrhea  Hypokalemia     Carmin Muskrat, MD 12/27/22 [phone]

## 2022-12-27 NOTE — ED Triage Notes (Signed)
Patient arrives in wheelchair by POV c/o left lower abdominal pain, emesis and diarrhea x 2 days. No relief with zofran.

## 2022-12-27 NOTE — ED Provider Notes (Signed)
F/u CT. Physical Exam  BP (!) 133/107 (BP Location: Left Arm)   Pulse 90   Temp 98.6 F (37 C) (Oral)   Resp 18   Ht 5' 8"$  (1.727 m)   Wt 65.8 kg   SpO2 97%   BMI 22.05 kg/m   Physical Exam  Procedures  Procedures  ED Course / MDM    Medical Decision Making Amount and/or Complexity of Data Reviewed Labs: ordered. Radiology: ordered.  Risk Prescription drug management.    Negative for acute findings.  At this time will prescribe Zofran for nausea.  Potassium prescribed for the next couple of days.  Advised the patient to have expeditious follow-up within the next 1 to 3 days with PCP, return precautions the emergency department if new or worsening symptoms.      Charlesetta Shanks, MD 12/27/22 [phone]

## 2022-12-27 NOTE — Discharge Instructions (Addendum)
Take Zofran as prescribed for nausea if needed. See your doctor for recheck in 1-3 days. Return to the ER if your symptoms worsening

## 2023-01-29 ENCOUNTER — Other Ambulatory Visit: Payer: Self-pay

## 2023-01-29 ENCOUNTER — Emergency Department (HOSPITAL_COMMUNITY)
Admission: EM | Admit: 2023-01-29 | Discharge: 2023-01-29 | Disposition: A | Discharge status: Home / Self Care | Payer: Medicaid Other | Attending: Emergency Medicine | Admitting: Emergency Medicine

## 2023-01-29 ENCOUNTER — Encounter (HOSPITAL_COMMUNITY): Payer: Self-pay

## 2023-01-29 ENCOUNTER — Emergency Department (HOSPITAL_COMMUNITY): Payer: Medicaid Other

## 2023-01-29 DIAGNOSIS — E871 Hypo-osmolality and hyponatremia: Secondary | ICD-10-CM | POA: Diagnosis not present

## 2023-01-29 DIAGNOSIS — R112 Nausea with vomiting, unspecified: Secondary | ICD-10-CM | POA: Diagnosis not present

## 2023-01-29 DIAGNOSIS — K5730 Diverticulosis of large intestine without perforation or abscess without bleeding: Secondary | ICD-10-CM | POA: Diagnosis not present

## 2023-01-29 DIAGNOSIS — E876 Hypokalemia: Secondary | ICD-10-CM | POA: Insufficient documentation

## 2023-01-29 DIAGNOSIS — N179 Acute kidney failure, unspecified: Secondary | ICD-10-CM

## 2023-01-29 DIAGNOSIS — R1032 Left lower quadrant pain: Secondary | ICD-10-CM | POA: Diagnosis not present

## 2023-01-29 DIAGNOSIS — R197 Diarrhea, unspecified: Secondary | ICD-10-CM | POA: Diagnosis not present

## 2023-01-29 DIAGNOSIS — E860 Dehydration: Secondary | ICD-10-CM | POA: Diagnosis not present

## 2023-01-29 LAB — COMPREHENSIVE METABOLIC PANEL
ALT: 19 U/L (ref 0–44)
AST: 18 U/L (ref 15–41)
Albumin: 4.2 g/dL (ref 3.5–5.0)
Alkaline Phosphatase: 55 U/L (ref 38–126)
Anion gap: 13 (ref 5–15)
BUN: 61 mg/dL — ABNORMAL HIGH (ref 6–20)
CO2: 26 mmol/L (ref 22–32)
Calcium: 8.6 mg/dL — ABNORMAL LOW (ref 8.9–10.3)
Chloride: 91 mmol/L — ABNORMAL LOW (ref 98–111)
Creatinine, Ser: 2.46 mg/dL — ABNORMAL HIGH (ref 0.61–1.24)
GFR, Estimated: 32 mL/min — ABNORMAL LOW (ref >60)
Glucose, Bld: 113 mg/dL — ABNORMAL HIGH (ref 70–99)
Potassium: 3.4 mmol/L — ABNORMAL LOW (ref 3.5–5.1)
Sodium: 130 mmol/L — ABNORMAL LOW (ref 135–145)
Total Bilirubin: 0.6 mg/dL (ref 0.3–1.2)
Total Protein: 7.6 g/dL (ref 6.5–8.1)

## 2023-01-29 LAB — CBC WITH DIFFERENTIAL/PLATELET
Abs Immature Granulocytes: 0.02 10*3/uL (ref 0.00–0.07)
Basophils Absolute: 0 10*3/uL (ref 0.0–0.1)
Basophils Relative: 1 %
Eosinophils Absolute: 0.4 10*3/uL (ref 0.0–0.5)
Eosinophils Relative: 5 %
HCT: 41.9 % (ref 39.0–52.0)
Hemoglobin: 14.8 g/dL (ref 13.0–17.0)
Immature Granulocytes: 0 %
Lymphocytes Relative: 27 %
Lymphs Abs: 2.2 10*3/uL (ref 0.7–4.0)
MCH: 32.4 pg (ref 26.0–34.0)
MCHC: 35.3 g/dL (ref 30.0–36.0)
MCV: 91.7 fL (ref 80.0–100.0)
Monocytes Absolute: 1.1 10*3/uL — ABNORMAL HIGH (ref 0.1–1.0)
Monocytes Relative: 13 %
Neutro Abs: 4.4 10*3/uL (ref 1.7–7.7)
Neutrophils Relative %: 54 %
Platelets: 300 10*3/uL (ref 150–400)
RBC: 4.57 MIL/uL (ref 4.22–5.81)
RDW: 12.3 % (ref 11.5–15.5)
WBC: 8.1 10*3/uL (ref 4.0–10.5)
nRBC: 0 % (ref 0.0–0.2)

## 2023-01-29 LAB — URINALYSIS, ROUTINE W REFLEX MICROSCOPIC
Bilirubin Urine: NEGATIVE
Glucose, UA: NEGATIVE mg/dL
Hgb urine dipstick: NEGATIVE
Ketones, ur: NEGATIVE mg/dL
Leukocytes,Ua: NEGATIVE
Nitrite: NEGATIVE
Protein, ur: NEGATIVE mg/dL
Specific Gravity, Urine: 1.013 (ref 1.005–1.030)
pH: 5 (ref 5.0–8.0)

## 2023-01-29 LAB — MAGNESIUM: Magnesium: 2.9 mg/dL — ABNORMAL HIGH (ref 1.7–2.4)

## 2023-01-29 LAB — LIPASE, BLOOD: Lipase: 44 U/L (ref 11–51)

## 2023-01-29 MED ORDER — ONDANSETRON HCL 4 MG/2ML IJ SOLN
4.0000 mg | Freq: Once | INTRAMUSCULAR | Status: AC
  Administered 2023-01-29: 4 mg via INTRAVENOUS
  Filled 2023-01-29: qty 2

## 2023-01-29 MED ORDER — FENTANYL CITRATE PF 50 MCG/ML IJ SOSY
50.0000 ug | PREFILLED_SYRINGE | Freq: Once | INTRAMUSCULAR | Status: AC
  Administered 2023-01-29: 50 ug via INTRAVENOUS
  Filled 2023-01-29: qty 1

## 2023-01-29 MED ORDER — SODIUM CHLORIDE 0.9 % IV BOLUS
1000.0000 mL | Freq: Once | INTRAVENOUS | Status: AC
  Administered 2023-01-29: 1000 mL via INTRAVENOUS

## 2023-01-29 MED ORDER — SODIUM CHLORIDE 0.9 % IV BOLUS
500.0000 mL | Freq: Once | INTRAVENOUS | Status: AC
  Administered 2023-01-29: 500 mL via INTRAVENOUS

## 2023-01-29 NOTE — Discharge Instructions (Signed)
You were seen in the emergency department for recurrent abdominal pain nausea vomiting diarrhea.  Your lab work showed your kidney numbers to be worsened, possibly reflecting some dehydration.  Your CAT scan did not show any acute findings.  It will be important for you to push more fluids.  You will also need to contact your primary care doctor to get some repeat blood work drawn.  Return to the emergency department if any worsening or concerning symptoms.

## 2023-01-29 NOTE — ED Notes (Signed)
Pt in bed, pt reports decreased pain, pt states that he is ready to go home, d/c pt iv, cath intact, pt verbalized understanding d/c and follow up, pt ambulatory from dpt

## 2023-01-29 NOTE — ED Provider Notes (Signed)
[facility] AT [facility] Provider Note   CSN: [id number] Arrival date & time: 01/29/23  1145     History  Chief Complaint  Patient presents with   Abdominal Pain    [facility] Johnson is a 47 y.o. male.  He is here with a complaint of left lower quadrant abdominal pain that is been going on since last week.  He said he has had this before.  It is associate with nausea vomiting.  He sometimes has diarrhea and sometimes is constipated.  He has been trying some Bentyl without significant improvement.  He has a first appointment with GI coming up but the appointment is not anytime soon.  No fevers or chills chest pain shortness of breath.  The history is provided by the patient.  Abdominal Pain Pain location:  LLQ Pain quality: cramping   Pain severity:  Moderate Onset quality:  Gradual Duration:  1 week Timing:  Intermittent Progression:  Unchanged Chronicity:  Recurrent Relieved by:  Heat Worsened by:  Nothing Ineffective treatments: bentyl. Associated symptoms: chills, constipation, diarrhea, nausea and vomiting   Associated symptoms: no chest pain, no cough, no dysuria, no fever, no hematemesis, no hematochezia, no hematuria and no shortness of breath        Home Medications Prior to Admission medications   Medication Sig Start Date End Date Taking? Authorizing Provider  allopurinol (ZYLOPRIM) 100 MG tablet Take 1 tablet (100 mg total) by mouth daily. 12/11/22   Kerin Perna, NP  amLODipine (NORVASC) 10 MG tablet Take 1 tablet (10 mg total) by mouth daily. 12/11/22   Kerin Perna, NP  cyclobenzaprine (FLEXERIL) 10 MG tablet Take 1 tablet (10 mg total) by mouth 2 (two) times daily as needed for muscle spasms. 12/11/22   Kerin Perna, NP  ondansetron (ZOFRAN-ODT) 4 MG disintegrating tablet Take 1 tablet (4 mg total) by mouth every 4 (four) hours as needed for nausea or vomiting. 12/27/22   Charlesetta Shanks, MD  potassium chloride SA  (KLOR-CON M) 20 MEQ tablet Take 1 tablet (20 mEq total) by mouth 2 (two) times daily. 12/27/22   Charlesetta Shanks, MD  valsartan-hydrochlorothiazide (DIOVAN-HCT) 160-25 MG tablet Take 1 tablet by mouth daily. 12/11/22   Kerin Perna, NP      Allergies    Patient has no known allergies.    Review of Systems   Review of Systems  Constitutional:  Positive for chills. Negative for fever.  Respiratory:  Negative for cough and shortness of breath.   Cardiovascular:  Negative for chest pain.  Gastrointestinal:  Positive for abdominal pain, constipation, diarrhea, nausea and vomiting. Negative for hematemesis and hematochezia.  Genitourinary:  Negative for dysuria and hematuria.    Physical Exam Updated Vital Signs BP 97/72 (BP Location: Left Arm)   Pulse (!) 106   Temp 98.2 F (36.8 C) (Oral)   Resp 18   Wt 65 kg   SpO2 100%   BMI 21.79 kg/m  Physical Exam Vitals and nursing note reviewed.  Constitutional:      General: He is not in acute distress.    Appearance: Normal appearance. He is well-developed.  HENT:     Head: Normocephalic and atraumatic.  Eyes:     Conjunctiva/sclera: Conjunctivae normal.  Cardiovascular:     Rate and Rhythm: Normal rate and regular rhythm.     Heart sounds: No murmur heard. Pulmonary:     Effort: Pulmonary effort is normal. No respiratory distress.  Breath sounds: Normal breath sounds.  Abdominal:     Palpations: Abdomen is soft.     Tenderness: There is no abdominal tenderness. There is no guarding or rebound.     Hernia: No hernia is present.  Musculoskeletal:        General: No deformity.     Cervical back: Neck supple.  Skin:    General: Skin is warm and dry.     Capillary Refill: Capillary refill takes less than 2 seconds.  Neurological:     General: No focal deficit present.     Mental Status: He is alert.     ED Results / Procedures / Treatments   Labs (all labs ordered are listed, but only abnormal results are  displayed) Labs Reviewed  COMPREHENSIVE METABOLIC PANEL - Abnormal; Notable for the following components:      Result Value   Sodium 130 (*)    Potassium 3.4 (*)    Chloride 91 (*)    Glucose, Bld 113 (*)    BUN 61 (*)    Creatinine, Ser 2.46 (*)    Calcium 8.6 (*)    GFR, Estimated 32 (*)    All other components within normal limits  CBC WITH DIFFERENTIAL/PLATELET - Abnormal; Notable for the following components:   Monocytes Absolute 1.1 (*)    All other components within normal limits  MAGNESIUM - Abnormal; Notable for the following components:   Magnesium 2.9 (*)    All other components within normal limits  LIPASE, BLOOD  URINALYSIS, ROUTINE W REFLEX MICROSCOPIC    EKG None  Radiology CT Renal Stone Study  Result Date: 01/29/2023 CLINICAL DATA:  Acute left lower quadrant abdominal pain. EXAM: CT ABDOMEN AND PELVIS WITHOUT CONTRAST TECHNIQUE: Multidetector CT imaging of the abdomen and pelvis was performed following the standard protocol without IV contrast. RADIATION DOSE REDUCTION: This exam was performed according to the departmental dose-optimization program which includes automated exposure control, adjustment of the mA and/or kV according to patient size and/or use of iterative reconstruction technique. COMPARISON:  December 27, 2022. FINDINGS: Lower chest: No acute abnormality. Hepatobiliary: No focal liver abnormality is seen. No gallstones, gallbladder wall thickening, or biliary dilatation. Pancreas: Unremarkable. No pancreatic ductal dilatation or surrounding inflammatory changes. Spleen: Normal in size without focal abnormality. Adrenals/Urinary Tract: Adrenal glands are unremarkable. Kidneys are normal, without renal calculi, focal lesion, or hydronephrosis. Bladder is unremarkable. Stomach/Bowel: Stomach is within normal limits. Appendix appears normal. No evidence of bowel wall thickening, distention, or inflammatory changes. Stool is noted throughout the colon.  Diverticulosis of descending and sigmoid colon is noted without inflammation. Vascular/Lymphatic: No significant vascular findings are present. No enlarged abdominal or pelvic lymph nodes. Reproductive: Prostate is unremarkable. Other: No abdominal wall hernia or abnormality. No abdominopelvic ascites. Musculoskeletal: No acute or significant osseous findings. IMPRESSION: Diverticulosis of descending and sigmoid colon is noted without inflammation. Stool is noted throughout the colon. No acute abnormality seen in the abdomen or pelvis. Electronically Signed   By: Marijo Conception M.D.   On: 01/29/2023 14:31    Procedures Procedures    Medications Ordered in ED Medications  sodium chloride 0.9 % bolus 500 mL (0 mLs Intravenous Stopped 01/29/23 1404)  ondansetron (ZOFRAN) injection 4 mg (4 mg Intravenous Given 01/29/23 1223)  fentaNYL (SUBLIMAZE) injection 50 mcg (50 mcg Intravenous Given 01/29/23 1225)  sodium chloride 0.9 % bolus 1,000 mL (0 mLs Intravenous Stopped 01/29/23 1519)    ED Course/ Medical Decision Making/ A&P Clinical Course as  of 01/29/23 1707  Tue Jan 29, 2023  1450 Patient has had no further vomiting here.  Rechecked his blood pressure and it was 107 he has no dizziness.  We discussed admission to the hospital he said he felt like he could stay hydrated at home.  He does have a PCP so recommended that he get his lab work rechecked in the next few days.  He does have an appointment with GI but is not till June.  He understands to return to the hospital if any worsening or concerning symptoms. [MB]    Clinical Course User Index [MB] Hayden Rasmussen, MD                             Medical Decision Making Amount and/or Complexity of Data Reviewed Labs: ordered. Radiology: ordered.  Risk Prescription drug management.   This patient complains of abdominal pain nausea vomiting diarrhea constipation; this involves an extensive number of treatment Options and is a complaint that  carries with it a high risk of complications and morbidity. The differential includes irritable bowel, colitis, diverticulitis, renal colic, dehydration, metabolic derangement, constipation  I ordered, reviewed and interpreted labs, which included CBC with normal white count normal hemoglobin, chemistries low sodium low potassium elevated BUN and creatinine, LFTs normal, urinalysis without signs of infection I ordered medication IV fluids nausea pain medication with improvement in his symptoms and reviewed PMP when indicated. I ordered imaging studies which included CT renal and I independently    visualized and interpreted imaging which showed no acute findings.  No obstructive stones. Previous records obtained and reviewed in epic, multiple ED visits for similar presentation Cardiac monitoring reviewed, normal sinus rhythm Social determinants considered, ongoing tobacco use Critical Interventions: None  After the interventions stated above, I reevaluated the patient and found patient's pain to be resolved no further nausea vomiting.  He is tolerating p.o. Admission and further testing considered, he might benefit from mission the hospital for further hydration.  Patient feels he can adequately monitor his symptoms at home and he is able to take p.o. and is hungry.  Recommended close follow-up with his PCP for repeat blood work and follow-up with GI as scheduled.  Return instructions discussed         Final Clinical Impression(s) / ED Diagnoses Final diagnoses:  LLQ abdominal pain  Nausea vomiting and diarrhea  AKI (acute kidney injury) ([place])    Rx / DC Orders ED Discharge Orders     None         Hayden Rasmussen, MD 01/29/23 1710

## 2023-01-29 NOTE — ED Triage Notes (Signed)
C/o LLQ abd cramping x2 days.  Denies n/v/d Worse when bending over and relief when soaking in hot bath.  Pt reports relief with bentyl but pain returns.

## 2023-03-12 ENCOUNTER — Encounter (INDEPENDENT_AMBULATORY_CARE_PROVIDER_SITE_OTHER): Payer: Self-pay | Admitting: Primary Care

## 2023-03-12 ENCOUNTER — Ambulatory Visit (INDEPENDENT_AMBULATORY_CARE_PROVIDER_SITE_OTHER): Payer: Medicaid Other | Admitting: Primary Care

## 2023-03-12 VITALS — BP 106/70 | HR 74 | Resp 16 | Wt 146.6 lb

## 2023-03-12 DIAGNOSIS — I10 Essential (primary) hypertension: Secondary | ICD-10-CM | POA: Diagnosis not present

## 2023-03-12 NOTE — Progress Notes (Unsigned)
  [facility]  [facility] Johnson, is a 47 y.o. male  [id number]  [id number]  DOB - 28-Nov-1975  No chief complaint on file.      Subjective:   [facility] Johnson is a 47 y.o. male here today for a follow up visit. Patient has No headache, No chest pain, No abdominal pain - No Nausea, No new weakness tingling or numbness, No Cough - shortness of breath  No problems updated.  No Known Allergies  Past Medical History:  Diagnosis Date   Gastritis    Hypertension     Current Outpatient Medications on File Prior to Visit  Medication Sig Dispense Refill   allopurinol (ZYLOPRIM) 100 MG tablet Take 1 tablet (100 mg total) by mouth daily. 90 tablet 1   amLODipine (NORVASC) 10 MG tablet Take 1 tablet (10 mg total) by mouth daily. 90 tablet 1   cyclobenzaprine (FLEXERIL) 10 MG tablet Take 1 tablet (10 mg total) by mouth 2 (two) times daily as needed for muscle spasms. 60 tablet 1   ondansetron (ZOFRAN-ODT) 4 MG disintegrating tablet Take 1 tablet (4 mg total) by mouth every 4 (four) hours as needed for nausea or vomiting. 20 tablet 0   potassium chloride SA (KLOR-CON M) 20 MEQ tablet Take 1 tablet (20 mEq total) by mouth 2 (two) times daily. 10 tablet 0   valsartan-hydrochlorothiazide (DIOVAN-HCT) 160-25 MG tablet Take 1 tablet by mouth daily. 90 tablet 1   No current facility-administered medications on file prior to visit.    Objective:  There were no vitals filed for this visit.  Comprehensive ROS Pertinent positive and negative noted in HPI   Exam General appearance : Awake, alert, not in any distress. Speech Clear. Not toxic looking HEENT: Atraumatic and Normocephalic, pupils equally reactive to light and accomodation Neck: Supple, no JVD. No cervical lymphadenopathy.  Chest: Good air entry bilaterally, no added sounds  CVS: S1 S2 regular, no murmurs.  Abdomen: Bowel sounds present, Non tender and not distended with no gaurding, rigidity or  rebound. Extremities: B/L Lower Ext shows no edema, both legs are warm to touch Neurology: Awake alert, and oriented X 3, CN II-XII intact, Non focal Skin: No Rash  Data Review No results found for: "HGBA1C"  Assessment & Plan       Patient have been counseled extensively about nutrition and exercise. Other issues discussed during this visit include: low cholesterol diet, weight control and daily exercise, foot care, annual eye examinations at Ophthalmology, importance of adherence with medications and regular follow-up. We also discussed long term complications of uncontrolled diabetes and hypertension.   No follow-ups on file.  The patient was given clear instructions to go to ER or return to medical center if symptoms don't improve, worsen or new problems develop. The patient verbalized understanding. The patient was told to call to get lab results if they haven't heard anything in the next week.   This note has been created with Education officer, environmental. Any transcriptional errors are unintentional.   [facility] Sessions, NP 03/12/2023, 3:00 PM
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
## 2023-04-23 ENCOUNTER — Emergency Department (HOSPITAL_COMMUNITY)
Admission: EM | Admit: 2023-04-23 | Discharge: 2023-04-23 | Disposition: A | Discharge status: Home / Self Care | Payer: Medicaid Other | Attending: Emergency Medicine | Admitting: Emergency Medicine

## 2023-04-23 ENCOUNTER — Encounter (HOSPITAL_COMMUNITY): Payer: Self-pay

## 2023-04-23 ENCOUNTER — Other Ambulatory Visit: Payer: Self-pay

## 2023-04-23 DIAGNOSIS — R197 Diarrhea, unspecified: Secondary | ICD-10-CM | POA: Diagnosis not present

## 2023-04-23 DIAGNOSIS — R1030 Lower abdominal pain, unspecified: Secondary | ICD-10-CM | POA: Insufficient documentation

## 2023-04-23 DIAGNOSIS — I10 Essential (primary) hypertension: Secondary | ICD-10-CM | POA: Insufficient documentation

## 2023-04-23 DIAGNOSIS — R112 Nausea with vomiting, unspecified: Secondary | ICD-10-CM | POA: Diagnosis not present

## 2023-04-23 DIAGNOSIS — Z79899 Other long term (current) drug therapy: Secondary | ICD-10-CM | POA: Diagnosis not present

## 2023-04-23 LAB — CBC WITH DIFFERENTIAL/PLATELET
Abs Immature Granulocytes: 0.04 10*3/uL (ref 0.00–0.07)
Basophils Absolute: 0 10*3/uL (ref 0.0–0.1)
Basophils Relative: 0 %
Eosinophils Absolute: 0 10*3/uL (ref 0.0–0.5)
Eosinophils Relative: 0 %
HCT: 37.7 % — ABNORMAL LOW (ref 39.0–52.0)
Hemoglobin: 13.2 g/dL (ref 13.0–17.0)
Immature Granulocytes: 0 %
Lymphocytes Relative: 14 %
Lymphs Abs: 1.6 10*3/uL (ref 0.7–4.0)
MCH: 32.6 pg (ref 26.0–34.0)
MCHC: 35 g/dL (ref 30.0–36.0)
MCV: 93.1 fL (ref 80.0–100.0)
Monocytes Absolute: 0.9 10*3/uL (ref 0.1–1.0)
Monocytes Relative: 8 %
Neutro Abs: 9.1 10*3/uL — ABNORMAL HIGH (ref 1.7–7.7)
Neutrophils Relative %: 78 %
Platelets: 458 10*3/uL — ABNORMAL HIGH (ref 150–400)
RBC: 4.05 MIL/uL — ABNORMAL LOW (ref 4.22–5.81)
RDW: 13.6 % (ref 11.5–15.5)
WBC: 11.7 10*3/uL — ABNORMAL HIGH (ref 4.0–10.5)
nRBC: 0 % (ref 0.0–0.2)

## 2023-04-23 LAB — COMPREHENSIVE METABOLIC PANEL
ALT: 17 U/L (ref 0–44)
AST: 16 U/L (ref 15–41)
Albumin: 4.4 g/dL (ref 3.5–5.0)
Alkaline Phosphatase: 61 U/L (ref 38–126)
Anion gap: 14 (ref 5–15)
BUN: 30 mg/dL — ABNORMAL HIGH (ref 6–20)
CO2: 27 mmol/L (ref 22–32)
Calcium: 9.2 mg/dL (ref 8.9–10.3)
Chloride: 104 mmol/L (ref 98–111)
Creatinine, Ser: 1.49 mg/dL — ABNORMAL HIGH (ref 0.61–1.24)
GFR, Estimated: 59 mL/min — ABNORMAL LOW (ref >60)
Glucose, Bld: 137 mg/dL — ABNORMAL HIGH (ref 70–99)
Potassium: 3.2 mmol/L — ABNORMAL LOW (ref 3.5–5.1)
Sodium: 145 mmol/L (ref 135–145)
Total Bilirubin: 1.3 mg/dL — ABNORMAL HIGH (ref 0.3–1.2)
Total Protein: 8.1 g/dL (ref 6.5–8.1)

## 2023-04-23 LAB — LIPASE, BLOOD: Lipase: 41 U/L (ref 11–51)

## 2023-04-23 MED ORDER — SODIUM CHLORIDE 0.9 % IV BOLUS
1000.0000 mL | Freq: Once | INTRAVENOUS | Status: AC
  Administered 2023-04-23: 1000 mL via INTRAVENOUS

## 2023-04-23 MED ORDER — DICYCLOMINE HCL 10 MG/ML IM SOLN
20.0000 mg | Freq: Once | INTRAMUSCULAR | Status: AC
  Administered 2023-04-23: 20 mg via INTRAMUSCULAR
  Filled 2023-04-23: qty 2

## 2023-04-23 MED ORDER — ONDANSETRON HCL 4 MG/2ML IJ SOLN
4.0000 mg | Freq: Once | INTRAMUSCULAR | Status: AC
  Administered 2023-04-23: 4 mg via INTRAVENOUS
  Filled 2023-04-23: qty 2

## 2023-04-23 NOTE — ED Provider Notes (Signed)
[place] EMERGENCY DEPARTMENT AT [facility] Provider Note   CSN: [id number] Arrival date & time: 04/23/23  9562     History  Chief Complaint  Patient presents with   Abdominal Pain    [facility] Johnson is a 47 y.o. male.  Patient presents to the emergency department complaining of 1 day of nausea, vomiting, diarrhea, and lower quadrant abdominal pain.  Patient states he has a history of the same and has either an endoscopy or colonoscopy scheduled for Thursday of this week with gastroenterology.  He states that in the past Bentyl has helped his symptoms but that he was unable to keep the pills down and saw them in his vomit at home.  He denies urinary symptoms, chest pain, shortness of breath, fevers.  He denies blood in the stool.  Past medical history significant for gastritis, hypertension  HPI     Home Medications Prior to Admission medications   Medication Sig Start Date End Date Taking? Authorizing Provider  allopurinol (ZYLOPRIM) 100 MG tablet Take 1 tablet (100 mg total) by mouth daily. 12/11/22   Grayce Sessions, NP  amLODipine (NORVASC) 10 MG tablet Take 1 tablet (10 mg total) by mouth daily. 12/11/22   Grayce Sessions, NP  cyclobenzaprine (FLEXERIL) 10 MG tablet Take 1 tablet (10 mg total) by mouth 2 (two) times daily as needed for muscle spasms. 12/11/22   Grayce Sessions, NP  ondansetron (ZOFRAN-ODT) 4 MG disintegrating tablet Take 1 tablet (4 mg total) by mouth every 4 (four) hours as needed for nausea or vomiting. 12/27/22   Arby Barrette, MD  potassium chloride SA (KLOR-CON M) 20 MEQ tablet Take 1 tablet (20 mEq total) by mouth 2 (two) times daily. 12/27/22   Arby Barrette, MD  valsartan-hydrochlorothiazide (DIOVAN-HCT) 160-25 MG tablet Take 1 tablet by mouth daily. 12/11/22   Grayce Sessions, NP      Allergies    Patient has no known allergies.    Review of Systems   Review of Systems  Physical Exam Updated Vital Signs BP (!) 115/90 (BP  Location: Right Arm)   Pulse 90   Temp (!) 97.4 F (36.3 C) (Oral)   Resp 19   Ht 5\' 8"  (1.727 m)   Wt 65.8 kg   SpO2 99%   BMI 22.05 kg/m  Physical Exam Vitals and nursing note reviewed.  Constitutional:      General: He is not in acute distress.    Appearance: He is well-developed.  HENT:     Head: Normocephalic and atraumatic.  Eyes:     General: No scleral icterus.    Conjunctiva/sclera: Conjunctivae normal.  Cardiovascular:     Rate and Rhythm: Normal rate and regular rhythm.     Heart sounds: No murmur heard. Pulmonary:     Effort: Pulmonary effort is normal. No respiratory distress.     Breath sounds: Normal breath sounds.  Abdominal:     Palpations: Abdomen is soft.     Tenderness: There is no abdominal tenderness.  Musculoskeletal:        General: No swelling.     Cervical back: Neck supple.  Skin:    General: Skin is warm and dry.     Capillary Refill: Capillary refill takes less than 2 seconds.  Neurological:     Mental Status: He is alert.  Psychiatric:        Mood and Affect: Mood normal.     ED Results / Procedures / Treatments  Labs (all labs ordered are listed, but only abnormal results are displayed) Labs Reviewed  CBC WITH DIFFERENTIAL/PLATELET - Abnormal; Notable for the following components:      Result Value   WBC 11.7 (*)    RBC 4.05 (*)    HCT 37.7 (*)    Platelets 458 (*)    Neutro Abs 9.1 (*)    All other components within normal limits  COMPREHENSIVE METABOLIC PANEL - Abnormal; Notable for the following components:   Potassium 3.2 (*)    Glucose, Bld 137 (*)    BUN 30 (*)    Creatinine, Ser 1.49 (*)    Total Bilirubin 1.3 (*)    GFR, Estimated 59 (*)    All other components within normal limits  LIPASE, BLOOD  URINALYSIS, ROUTINE W REFLEX MICROSCOPIC    EKG None  Radiology No results found.  Procedures Procedures    Medications Ordered in ED Medications  ondansetron (ZOFRAN) injection 4 mg (4 mg Intravenous Given  04/23/23 0745)  dicyclomine (BENTYL) injection 20 mg (20 mg Intramuscular Given 04/23/23 0746)  sodium chloride 0.9 % bolus 1,000 mL (1,000 mLs Intravenous New Bag/Given 04/23/23 0755)    ED Course/ Medical Decision Making/ A&P                             Medical Decision Making Amount and/or Complexity of Data Reviewed Labs: ordered.  Risk Prescription drug management.   This patient presents to the ED for concern of abdominal pain, nausea, vomiting, diarrhea, this involves an extensive number of treatment options, and is a complaint that carries with it a high risk of complications and morbidity.  The differential diagnosis includes gastritis, colitis, appendicitis, cholecystitis, others   Co morbidities that complicate the patient evaluation  History of gastritis   Additional history obtained:   External records from outside source obtained and reviewed including notes from gastroenterology   Lab Tests:  I Ordered, and personally interpreted labs.  The pertinent results include: Potassium 3.2, creatinine 1.49 (2.46 two months ago), no leukocytosis, normal lipase   Imaging Studies ordered:  Patient with no significant abdominal tenderness.  Nonacute/nonsurgical abdomen.  No indication for emergent abdominal imaging at this time   Problem List / ED Course / Critical interventions / Medication management   I ordered medication including Bentyl, Zofran, normal saline Reevaluation of the patient after these medicines showed that the patient improved I have reviewed the patients home medicines and have made adjustments as needed   Social Determinants of Health:  Patient has Medicaid for his primary insurance type   Test / Admission - Considered:  Patient feels much better after medication administration and now rates pain 3 out of 10 in severity.  I see no indication for admission.  Plan to discharge home at this time with plans for follow-up with gastroenterology as  planned for upcoming scope.  Return precautions provided.         Final Clinical Impression(s) / ED Diagnoses Final diagnoses:  Nausea vomiting and diarrhea  Lower abdominal pain    Rx / DC Orders ED Discharge Orders     None         Pamala Duffel 04/23/23 1610    Pricilla Loveless, MD 04/25/23 [phone]

## 2023-04-23 NOTE — ED Triage Notes (Signed)
Patient reports abd pain, n/v/d since yesterday around 1pm, last vomited around 0500. Pain 8/10.

## 2023-04-23 NOTE — Discharge Instructions (Addendum)
You were evaluated today for abdominal pain which improved with medications.  Please keep your upcoming appointment with gastroenterology for further evaluation and management.  If you develop any life-threatening symptoms please return to the emergency department.

## 2023-04-25 ENCOUNTER — Other Ambulatory Visit: Payer: Self-pay

## 2023-04-25 ENCOUNTER — Encounter (HOSPITAL_COMMUNITY): Payer: Self-pay

## 2023-04-25 ENCOUNTER — Emergency Department (HOSPITAL_COMMUNITY)
Admission: EM | Admit: 2023-04-25 | Discharge: 2023-04-25 | Disposition: A | Discharge status: Home / Self Care | Payer: Medicaid Other | Attending: Emergency Medicine | Admitting: Emergency Medicine

## 2023-04-25 DIAGNOSIS — Z79899 Other long term (current) drug therapy: Secondary | ICD-10-CM | POA: Diagnosis not present

## 2023-04-25 DIAGNOSIS — R112 Nausea with vomiting, unspecified: Secondary | ICD-10-CM | POA: Insufficient documentation

## 2023-04-25 DIAGNOSIS — I10 Essential (primary) hypertension: Secondary | ICD-10-CM | POA: Diagnosis not present

## 2023-04-25 DIAGNOSIS — E876 Hypokalemia: Secondary | ICD-10-CM | POA: Insufficient documentation

## 2023-04-25 DIAGNOSIS — R197 Diarrhea, unspecified: Secondary | ICD-10-CM | POA: Diagnosis not present

## 2023-04-25 LAB — LIPASE, BLOOD: Lipase: 52 U/L — ABNORMAL HIGH (ref 11–51)

## 2023-04-25 LAB — CBC
HCT: 42.2 % (ref 39.0–52.0)
Hemoglobin: 14.6 g/dL (ref 13.0–17.0)
MCH: 32 pg (ref 26.0–34.0)
MCHC: 34.6 g/dL (ref 30.0–36.0)
MCV: 92.5 fL (ref 80.0–100.0)
Platelets: 442 10*3/uL — ABNORMAL HIGH (ref 150–400)
RBC: 4.56 MIL/uL (ref 4.22–5.81)
RDW: 13.1 % (ref 11.5–15.5)
WBC: 11.1 10*3/uL — ABNORMAL HIGH (ref 4.0–10.5)
nRBC: 0 % (ref 0.0–0.2)

## 2023-04-25 LAB — URINALYSIS, ROUTINE W REFLEX MICROSCOPIC
Bacteria, UA: NONE SEEN
Glucose, UA: NEGATIVE mg/dL
Hgb urine dipstick: NEGATIVE
Ketones, ur: 5 mg/dL — AB
Leukocytes,Ua: NEGATIVE
Nitrite: NEGATIVE
Protein, ur: >=300 mg/dL — AB
Specific Gravity, Urine: 1.032 — ABNORMAL HIGH (ref 1.005–1.030)
pH: 5 (ref 5.0–8.0)

## 2023-04-25 LAB — RAPID URINE DRUG SCREEN, HOSP PERFORMED
Amphetamines: NOT DETECTED
Barbiturates: NOT DETECTED
Benzodiazepines: NOT DETECTED
Cocaine: NOT DETECTED
Opiates: NOT DETECTED
Tetrahydrocannabinol: POSITIVE — AB

## 2023-04-25 LAB — COMPREHENSIVE METABOLIC PANEL
ALT: 16 U/L (ref 0–44)
AST: 17 U/L (ref 15–41)
Albumin: 4.7 g/dL (ref 3.5–5.0)
Alkaline Phosphatase: 64 U/L (ref 38–126)
Anion gap: 11 (ref 5–15)
BUN: 25 mg/dL — ABNORMAL HIGH (ref 6–20)
CO2: 23 mmol/L (ref 22–32)
Calcium: 9 mg/dL (ref 8.9–10.3)
Chloride: 99 mmol/L (ref 98–111)
Creatinine, Ser: 1.78 mg/dL — ABNORMAL HIGH (ref 0.61–1.24)
GFR, Estimated: 47 mL/min — ABNORMAL LOW (ref >60)
Glucose, Bld: 136 mg/dL — ABNORMAL HIGH (ref 70–99)
Potassium: 2.9 mmol/L — ABNORMAL LOW (ref 3.5–5.1)
Sodium: 133 mmol/L — ABNORMAL LOW (ref 135–145)
Total Bilirubin: 1.5 mg/dL — ABNORMAL HIGH (ref 0.3–1.2)
Total Protein: 8.4 g/dL — ABNORMAL HIGH (ref 6.5–8.1)

## 2023-04-25 MED ORDER — POTASSIUM CHLORIDE CRYS ER 20 MEQ PO TBCR
40.0000 meq | EXTENDED_RELEASE_TABLET | Freq: Once | ORAL | Status: AC
  Administered 2023-04-25: 40 meq via ORAL
  Filled 2023-04-25: qty 2

## 2023-04-25 MED ORDER — PROMETHAZINE HCL 25 MG PO TABS: 25.0000 mg | ORAL_TABLET | Freq: Four times a day (QID) | ORAL | 0 refills | Status: DC | PRN

## 2023-04-25 MED ORDER — SODIUM CHLORIDE 0.9 % IV BOLUS
1000.0000 mL | Freq: Once | INTRAVENOUS | Status: AC
  Administered 2023-04-25: 1000 mL via INTRAVENOUS

## 2023-04-25 MED ORDER — ONDANSETRON HCL 4 MG PO TABS
4.0000 mg | ORAL_TABLET | Freq: Once | ORAL | Status: AC
  Administered 2023-04-25: 4 mg via ORAL
  Filled 2023-04-25: qty 1

## 2023-04-25 MED ORDER — POTASSIUM CHLORIDE CRYS ER 20 MEQ PO TBCR: 20.0000 meq | EXTENDED_RELEASE_TABLET | Freq: Every day | ORAL | 0 refills | Status: DC

## 2023-04-25 MED ORDER — SODIUM CHLORIDE 0.9 % IV SOLN
12.5000 mg | Freq: Once | INTRAVENOUS | Status: AC
  Administered 2023-04-25: 12.5 mg via INTRAVENOUS
  Filled 2023-04-25: qty 12.5

## 2023-04-25 MED ORDER — POTASSIUM CHLORIDE 10 MEQ/100ML IV SOLN
10.0000 meq | INTRAVENOUS | Status: AC
  Administered 2023-04-25: 10 meq via INTRAVENOUS
  Filled 2023-04-25 (×2): qty 100

## 2023-04-25 NOTE — ED Triage Notes (Signed)
Lower abdominal pain, nausea, vomiting, and diarrhea for 2 days. Pt was supposed to have colonoscopy today but was unable to complete prep so did not have it done. Pt unable to keep anything down.  Seen here 2 days ago for same.

## 2023-04-25 NOTE — Discharge Instructions (Addendum)
Please reach out and follow-up closely with your GI specialist for outpatient management of your condition.  You may benefit from pill form colonoscopy prep as it is more tolerable.  You may take Phenergan as needed for nausea.  Return to the ER if you have worsening symptoms or if you have any concern.

## 2023-04-25 NOTE — ED Provider Notes (Signed)
[place] EMERGENCY DEPARTMENT AT [facility] Provider Note   CSN: [id number] Arrival date & time: 04/25/23  1514     History  Chief Complaint  Patient presents with   Emesis   Abdominal Pain    Victor Johnson is a 47 y.o. male.  The history is provided by the patient and medical records. No language interpreter was used.  Emesis Associated symptoms: abdominal pain   Abdominal Pain Associated symptoms: vomiting      47 year old male significant history gastritis, diverticulosis, intractable nausea and vomiting, hypertension presenting with complaint of nausea vomiting diarrhea.  Patient states that he is scheduled to have a colonoscopy done today.  He was started on a bowel prep and since starting the bowel prep he has had abdominal cramping with associate nausea vomiting diarrhea.  He mention the bowel prep taste like "beach water".  States he cannot tolerated as he has vomited multiple times.  Denies any bloody emesis.  States he feels weak from losing so much fluid.  Endorses abdominal cramping from vomiting.  Does not endorse any fever or chills.  States that he does use marijuana but has not used any recently in preparation for the bowel prep.  He mention being seen 2 days ago for similar presentation.  Patient states he has been taking Zofran at home, tried hydrating himself but noticed no improvement.  Home Medications Prior to Admission medications   Medication Sig Start Date End Date Taking? Authorizing Provider  allopurinol (ZYLOPRIM) 100 MG tablet Take 1 tablet (100 mg total) by mouth daily. 12/11/22   Grayce Sessions, NP  amLODipine (NORVASC) 10 MG tablet Take 1 tablet (10 mg total) by mouth daily. 12/11/22   Grayce Sessions, NP  cyclobenzaprine (FLEXERIL) 10 MG tablet Take 1 tablet (10 mg total) by mouth 2 (two) times daily as needed for muscle spasms. 12/11/22   Grayce Sessions, NP  ondansetron (ZOFRAN-ODT) 4 MG disintegrating tablet Take 1 tablet (4  mg total) by mouth every 4 (four) hours as needed for nausea or vomiting. 12/27/22   Arby Barrette, MD  potassium chloride SA (KLOR-CON M) 20 MEQ tablet Take 1 tablet (20 mEq total) by mouth 2 (two) times daily. 12/27/22   Arby Barrette, MD  valsartan-hydrochlorothiazide (DIOVAN-HCT) 160-25 MG tablet Take 1 tablet by mouth daily. 12/11/22   Grayce Sessions, NP      Allergies    Patient has no known allergies.    Review of Systems   Review of Systems  Gastrointestinal:  Positive for abdominal pain and vomiting.  All other systems reviewed and are negative.   Physical Exam Updated Vital Signs BP (!) 129/96 (BP Location: Left Arm)   Pulse 98   Temp 98.4 F (36.9 C) (Oral)   Resp 17   Ht 5\' 8"  (1.727 m)   Wt 65.8 kg   SpO2 100%   BMI 22.05 kg/m  Physical Exam Vitals and nursing note reviewed.  Constitutional:      General: He is not in acute distress.    Appearance: He is well-developed.  HENT:     Head: Atraumatic.  Eyes:     Conjunctiva/sclera: Conjunctivae normal.  Cardiovascular:     Rate and Rhythm: Normal rate and regular rhythm.  Pulmonary:     Effort: Pulmonary effort is normal.  Abdominal:     General: Abdomen is flat. Bowel sounds are normal.     Palpations: Abdomen is soft.     Tenderness: There is abdominal  tenderness (Mild generalized abdominal tenderness no guarding rebound tenderness no focal point tenderness).  Musculoskeletal:     Cervical back: Neck supple.  Skin:    Findings: No rash.  Neurological:     Mental Status: He is alert.     ED Results / Procedures / Treatments   Labs (all labs ordered are listed, but only abnormal results are displayed) Labs Reviewed  LIPASE, BLOOD - Abnormal; Notable for the following components:      Result Value   Lipase 52 (*)    All other components within normal limits  COMPREHENSIVE METABOLIC PANEL - Abnormal; Notable for the following components:   Sodium 133 (*)    Potassium 2.9 (*)    Glucose, Bld  136 (*)    BUN 25 (*)    Creatinine, Ser 1.78 (*)    Total Protein 8.4 (*)    Total Bilirubin 1.5 (*)    GFR, Estimated 47 (*)    All other components within normal limits  CBC - Abnormal; Notable for the following components:   WBC 11.1 (*)    Platelets 442 (*)    All other components within normal limits  URINALYSIS, ROUTINE W REFLEX MICROSCOPIC - Abnormal; Notable for the following components:   Color, Urine AMBER (*)    APPearance HAZY (*)    Specific Gravity, Urine 1.032 (*)    Bilirubin Urine SMALL (*)    Ketones, ur 5 (*)    Protein, ur >=300 (*)    All other components within normal limits  RAPID URINE DRUG SCREEN, HOSP PERFORMED - Abnormal; Notable for the following components:   Tetrahydrocannabinol POSITIVE (*)    All other components within normal limits    EKG None  Radiology No results found.  Procedures Procedures    Medications Ordered in ED Medications  potassium chloride 10 mEq in 100 mL IVPB (10 mEq Intravenous New Bag/Given 04/25/23 1901)  ondansetron (ZOFRAN) tablet 4 mg (4 mg Oral Given 04/25/23 1529)  sodium chloride 0.9 % bolus 1,000 mL (0 mLs Intravenous Stopped 04/25/23 2032)  promethazine (PHENERGAN) 12.5 mg in sodium chloride 0.9 % 50 mL IVPB (0 mg Intravenous Stopped 04/25/23 2032)  potassium chloride SA (KLOR-CON M) CR tablet 40 mEq (40 mEq Oral Given 04/25/23 1902)    ED Course/ Medical Decision Making/ A&P                             Medical Decision Making Amount and/or Complexity of Data Reviewed Labs: ordered.  Risk Prescription drug management.   BP (!) 106/94 (BP Location: Left Arm)   Pulse 88   Temp 98.4 F (36.9 C) (Oral)   Resp 18   Ht 5\' 8"  (1.727 m)   Wt 65.8 kg   SpO2 100%   BMI 22.05 kg/m   76:77 PM 47 year old male significant history gastritis, diverticulosis, intractable nausea and vomiting, hypertension presenting with complaint of nausea vomiting diarrhea.  Patient states that he is scheduled to have a  colonoscopy done today.  He was started on a bowel prep and since starting the bowel prep he has had abdominal cramping with associate nausea vomiting diarrhea.  He mention the bowel prep taste like "beach water".  States he cannot tolerated as he has vomited multiple times.  Denies any bloody emesis.  States he feels weak from losing so much fluid.  Endorses abdominal cramping from vomiting.  Does not endorse any fever or chills.  States that he does use marijuana but has not used any recently in preparation for the bowel prep.  He mention being seen 2 days ago for similar presentation.  On exam, patient is resting comfortably appears to be in no acute discomfort.  Heart with normal rate and rhythm, lungs clear to auscultation bilaterally abdomen is soft with mild generalized tenderness but no focal point tenderness no guarding or rebound tenderness.  Vital signs overall reassuring.    -Labs ordered, independently viewed and interpreted by me.  Labs remarkable for K+ 2.9, supplementation given.  Cr 1.78, pt received IVF.  WBC 11.1, nonspecific -The patient was maintained on a cardiac monitor.  I personally viewed and interpreted the cardiac monitored which showed an underlying rhythm of: NSR -Imaging including abd/pelvis CT considered, however pt with minimal abdominal tenderness and has had 3 CT scan within 6 months, all showing diverticulosis without diverticulitis.  After discussion without including risk/benefit of CT scan, we opted not to obtain another CT at this time.  -This patient presents to the ED for concern of n/v/d, this involves an extensive number of treatment options, and is a complaint that carries with it a high risk of complications and morbidity.  The differential diagnosis includes medication intolerance, cannabinoid hyperemesis syndrome, gastritis, GI bug, diverticulitis -Co morbidities that complicate the patient evaluation includes diverticulosis, marijuana use -Treatment  includes IVF, potassium, phenergan -Reevaluation of the patient after these medicines showed that the patient improved -PCP office notes or outside notes reviewed -Discussion with specialist attending Dr. Lynelle Doctor -Escalation to admission/observation considered: patients feels much better, is comfortable with discharge, and will follow up with PCP -Prescription medication considered, patient comfortable with Potassium, phenergan -Social Determinant of Health considered which includes tobacco use  After supportive care including IV fluid, antiemetic, and potassium, patient felt better.  Tolerates p.o.  Encourage patient to call and follow-up closely with his GI specialist for outpatient management of his condition.  He may benefit from pill form bowel cleansing agent for colonoscopy prep.           Final Clinical Impression(s) / ED Diagnoses Final diagnoses:  Nausea vomiting and diarrhea  Hypokalemia    Rx / DC Orders ED Discharge Orders          Ordered    promethazine (PHENERGAN) 25 MG tablet  Every 6 hours PRN        04/25/23 2031    potassium chloride SA (KLOR-CON M) 20 MEQ tablet  Daily        04/25/23 2032              Fayrene Helper, PA-C 04/25/23 2033    Linwood Dibbles, MD 04/26/23 [phone]

## 2023-04-29 ENCOUNTER — Other Ambulatory Visit (HOSPITAL_COMMUNITY): Payer: Self-pay | Admitting: Gastroenterology

## 2023-04-29 DIAGNOSIS — E876 Hypokalemia: Secondary | ICD-10-CM | POA: Diagnosis not present

## 2023-04-29 DIAGNOSIS — R112 Nausea with vomiting, unspecified: Secondary | ICD-10-CM

## 2023-04-30 ENCOUNTER — Other Ambulatory Visit: Payer: Self-pay

## 2023-04-30 ENCOUNTER — Encounter (HOSPITAL_COMMUNITY): Payer: Self-pay

## 2023-04-30 ENCOUNTER — Inpatient Hospital Stay (HOSPITAL_COMMUNITY)
Admission: EM | Admit: 2023-04-30 | Discharge: 2023-05-02 | DRG: 683 | Disposition: A | Discharge status: Home / Self Care | Payer: Medicaid Other | Attending: Internal Medicine | Admitting: Internal Medicine

## 2023-04-30 DIAGNOSIS — K2289 Other specified disease of esophagus: Secondary | ICD-10-CM | POA: Diagnosis not present

## 2023-04-30 DIAGNOSIS — R000 Tachycardia, unspecified: Secondary | ICD-10-CM | POA: Diagnosis not present

## 2023-04-30 DIAGNOSIS — I998 Other disorder of circulatory system: Secondary | ICD-10-CM | POA: Diagnosis not present

## 2023-04-30 DIAGNOSIS — R112 Nausea with vomiting, unspecified: Secondary | ICD-10-CM | POA: Diagnosis present

## 2023-04-30 DIAGNOSIS — N179 Acute kidney failure, unspecified: Secondary | ICD-10-CM | POA: Diagnosis present

## 2023-04-30 DIAGNOSIS — I959 Hypotension, unspecified: Secondary | ICD-10-CM | POA: Diagnosis not present

## 2023-04-30 DIAGNOSIS — E860 Dehydration: Secondary | ICD-10-CM | POA: Diagnosis present

## 2023-04-30 DIAGNOSIS — K2970 Gastritis, unspecified, without bleeding: Secondary | ICD-10-CM | POA: Diagnosis present

## 2023-04-30 DIAGNOSIS — Z681 Body mass index (BMI) 19 or less, adult: Secondary | ICD-10-CM

## 2023-04-30 DIAGNOSIS — K2210 Ulcer of esophagus without bleeding: Secondary | ICD-10-CM | POA: Diagnosis present

## 2023-04-30 DIAGNOSIS — E861 Hypovolemia: Secondary | ICD-10-CM | POA: Diagnosis not present

## 2023-04-30 DIAGNOSIS — F17210 Nicotine dependence, cigarettes, uncomplicated: Secondary | ICD-10-CM | POA: Diagnosis present

## 2023-04-30 DIAGNOSIS — E876 Hypokalemia: Secondary | ICD-10-CM | POA: Diagnosis present

## 2023-04-30 DIAGNOSIS — B3781 Candidal esophagitis: Secondary | ICD-10-CM | POA: Diagnosis not present

## 2023-04-30 DIAGNOSIS — K2930 Chronic superficial gastritis without bleeding: Secondary | ICD-10-CM | POA: Diagnosis not present

## 2023-04-30 DIAGNOSIS — I10 Essential (primary) hypertension: Secondary | ICD-10-CM | POA: Diagnosis present

## 2023-04-30 DIAGNOSIS — N170 Acute kidney failure with tubular necrosis: Principal | ICD-10-CM | POA: Diagnosis present

## 2023-04-30 DIAGNOSIS — Z79899 Other long term (current) drug therapy: Secondary | ICD-10-CM

## 2023-04-30 DIAGNOSIS — K3189 Other diseases of stomach and duodenum: Secondary | ICD-10-CM | POA: Diagnosis not present

## 2023-04-30 DIAGNOSIS — B9681 Helicobacter pylori [H. pylori] as the cause of diseases classified elsewhere: Secondary | ICD-10-CM | POA: Diagnosis not present

## 2023-04-30 DIAGNOSIS — F1210 Cannabis abuse, uncomplicated: Secondary | ICD-10-CM | POA: Diagnosis present

## 2023-04-30 LAB — COMPREHENSIVE METABOLIC PANEL
ALT: 13 U/L (ref 0–44)
AST: 12 U/L — ABNORMAL LOW (ref 15–41)
Albumin: 3.8 g/dL (ref 3.5–5.0)
Alkaline Phosphatase: 50 U/L (ref 38–126)
Anion gap: 13 (ref 5–15)
BUN: 54 mg/dL — ABNORMAL HIGH (ref 6–20)
CO2: 21 mmol/L — ABNORMAL LOW (ref 22–32)
Calcium: 8 mg/dL — ABNORMAL LOW (ref 8.9–10.3)
Chloride: 98 mmol/L (ref 98–111)
Creatinine, Ser: 8.18 mg/dL — ABNORMAL HIGH (ref 0.61–1.24)
GFR, Estimated: 8 mL/min — ABNORMAL LOW (ref >60)
Glucose, Bld: 94 mg/dL (ref 70–99)
Potassium: 3.2 mmol/L — ABNORMAL LOW (ref 3.5–5.1)
Sodium: 132 mmol/L — ABNORMAL LOW (ref 135–145)
Total Bilirubin: 0.6 mg/dL (ref 0.3–1.2)
Total Protein: 6.5 g/dL (ref 6.5–8.1)

## 2023-04-30 LAB — CBC
HCT: 41.3 % (ref 39.0–52.0)
Hemoglobin: 13.6 g/dL (ref 13.0–17.0)
MCH: 32.2 pg (ref 26.0–34.0)
MCHC: 32.9 g/dL (ref 30.0–36.0)
MCV: 97.6 fL (ref 80.0–100.0)
Platelets: 393 10*3/uL (ref 150–400)
RBC: 4.23 MIL/uL (ref 4.22–5.81)
RDW: 13.6 % (ref 11.5–15.5)
WBC: 8.1 10*3/uL (ref 4.0–10.5)
nRBC: 0 % (ref 0.0–0.2)

## 2023-04-30 LAB — URINALYSIS, ROUTINE W REFLEX MICROSCOPIC
Bilirubin Urine: NEGATIVE
Glucose, UA: NEGATIVE mg/dL
Ketones, ur: NEGATIVE mg/dL
Leukocytes,Ua: NEGATIVE
Nitrite: NEGATIVE
Protein, ur: 30 mg/dL — AB
Specific Gravity, Urine: 1.015 (ref 1.005–1.030)
pH: 5 (ref 5.0–8.0)

## 2023-04-30 LAB — CBG MONITORING, ED: Glucose-Capillary: 107 mg/dL — ABNORMAL HIGH (ref 70–99)

## 2023-04-30 MED ORDER — LACTATED RINGERS IV BOLUS
1000.0000 mL | Freq: Once | INTRAVENOUS | Status: AC
  Administered 2023-04-30: 1000 mL via INTRAVENOUS

## 2023-04-30 MED ORDER — ALBUMIN HUMAN 25 % IV SOLN
25.0000 g | Freq: Once | INTRAVENOUS | Status: AC
  Administered 2023-05-01: 25 g via INTRAVENOUS
  Filled 2023-04-30: qty 100

## 2023-04-30 MED ORDER — LACTATED RINGERS IV BOLUS
1000.0000 mL | Freq: Once | INTRAVENOUS | Status: AC
  Administered 2023-05-01: 1000 mL via INTRAVENOUS

## 2023-04-30 NOTE — ED Triage Notes (Signed)
Patient reports he had endoscopy today and was called by his dr this evening to come to ER due to elevated kidney function labs. Endorses some weakness, has not been able to eat and drink much. Patient noted to be hypotensive in triage, initial BP 86/65 (73) map, and repeat in the 70's systolic.

## 2023-04-30 NOTE — ED Notes (Signed)
MD made aware of vital signs.

## 2023-05-01 ENCOUNTER — Encounter (HOSPITAL_COMMUNITY): Payer: Self-pay | Admitting: Internal Medicine

## 2023-05-01 ENCOUNTER — Emergency Department (HOSPITAL_COMMUNITY): Payer: Medicaid Other

## 2023-05-01 DIAGNOSIS — E876 Hypokalemia: Secondary | ICD-10-CM | POA: Diagnosis not present

## 2023-05-01 DIAGNOSIS — R112 Nausea with vomiting, unspecified: Secondary | ICD-10-CM

## 2023-05-01 DIAGNOSIS — R109 Unspecified abdominal pain: Secondary | ICD-10-CM | POA: Diagnosis not present

## 2023-05-01 DIAGNOSIS — I959 Hypotension, unspecified: Secondary | ICD-10-CM | POA: Diagnosis not present

## 2023-05-01 DIAGNOSIS — R935 Abnormal findings on diagnostic imaging of other abdominal regions, including retroperitoneum: Secondary | ICD-10-CM | POA: Diagnosis not present

## 2023-05-01 DIAGNOSIS — K2970 Gastritis, unspecified, without bleeding: Secondary | ICD-10-CM | POA: Diagnosis present

## 2023-05-01 DIAGNOSIS — I10 Essential (primary) hypertension: Secondary | ICD-10-CM

## 2023-05-01 DIAGNOSIS — I998 Other disorder of circulatory system: Secondary | ICD-10-CM | POA: Diagnosis not present

## 2023-05-01 DIAGNOSIS — K5730 Diverticulosis of large intestine without perforation or abscess without bleeding: Secondary | ICD-10-CM | POA: Diagnosis not present

## 2023-05-01 DIAGNOSIS — E860 Dehydration: Secondary | ICD-10-CM | POA: Diagnosis present

## 2023-05-01 DIAGNOSIS — K2210 Ulcer of esophagus without bleeding: Secondary | ICD-10-CM | POA: Diagnosis present

## 2023-05-01 DIAGNOSIS — N179 Acute kidney failure, unspecified: Secondary | ICD-10-CM

## 2023-05-01 DIAGNOSIS — F17210 Nicotine dependence, cigarettes, uncomplicated: Secondary | ICD-10-CM | POA: Diagnosis present

## 2023-05-01 DIAGNOSIS — F1210 Cannabis abuse, uncomplicated: Secondary | ICD-10-CM | POA: Diagnosis present

## 2023-05-01 DIAGNOSIS — E869 Volume depletion, unspecified: Secondary | ICD-10-CM | POA: Diagnosis not present

## 2023-05-01 DIAGNOSIS — Z681 Body mass index (BMI) 19 or less, adult: Secondary | ICD-10-CM | POA: Diagnosis not present

## 2023-05-01 DIAGNOSIS — E861 Hypovolemia: Secondary | ICD-10-CM | POA: Diagnosis not present

## 2023-05-01 DIAGNOSIS — R531 Weakness: Secondary | ICD-10-CM | POA: Diagnosis not present

## 2023-05-01 DIAGNOSIS — N170 Acute kidney failure with tubular necrosis: Secondary | ICD-10-CM | POA: Diagnosis not present

## 2023-05-01 DIAGNOSIS — Z79899 Other long term (current) drug therapy: Secondary | ICD-10-CM | POA: Diagnosis not present

## 2023-05-01 LAB — BASIC METABOLIC PANEL
Anion gap: 10 (ref 5–15)
Anion gap: 8 (ref 5–15)
BUN: 51 mg/dL — ABNORMAL HIGH (ref 6–20)
BUN: 43 mg/dL — ABNORMAL HIGH (ref 6–20)
CO2: 24 mmol/L (ref 22–32)
CO2: 27 mmol/L (ref 22–32)
Calcium: 8.5 mg/dL — ABNORMAL LOW (ref 8.9–10.3)
Calcium: 8.6 mg/dL — ABNORMAL LOW (ref 8.9–10.3)
Chloride: 101 mmol/L (ref 98–111)
Chloride: 101 mmol/L (ref 98–111)
Creatinine, Ser: 6.01 mg/dL — ABNORMAL HIGH (ref 0.61–1.24)
Creatinine, Ser: 2.9 mg/dL — ABNORMAL HIGH (ref 0.61–1.24)
GFR, Estimated: 11 mL/min — ABNORMAL LOW (ref >60)
GFR, Estimated: 26 mL/min — ABNORMAL LOW (ref >60)
Glucose, Bld: 112 mg/dL — ABNORMAL HIGH (ref 70–99)
Glucose, Bld: 88 mg/dL (ref 70–99)
Potassium: 3.4 mmol/L — ABNORMAL LOW (ref 3.5–5.1)
Potassium: 3.9 mmol/L (ref 3.5–5.1)
Sodium: 135 mmol/L (ref 135–145)
Sodium: 136 mmol/L (ref 135–145)

## 2023-05-01 LAB — CREATININE, URINE, RANDOM: Creatinine, Urine: 402 mg/dL

## 2023-05-01 LAB — MAGNESIUM: Magnesium: 1.9 mg/dL (ref 1.7–2.4)

## 2023-05-01 LAB — SODIUM, URINE, RANDOM: Sodium, Ur: 34 mmol/L

## 2023-05-01 MED ORDER — ACETAMINOPHEN 650 MG RE SUPP: 650.0000 mg | Freq: Four times a day (QID) | RECTAL | Status: DC | PRN

## 2023-05-01 MED ORDER — POTASSIUM CHLORIDE 20 MEQ PO PACK
20.0000 meq | PACK | Freq: Once | ORAL | Status: AC
  Administered 2023-05-01: 20 meq via ORAL
  Filled 2023-05-01: qty 1

## 2023-05-01 MED ORDER — ONDANSETRON HCL 4 MG/2ML IJ SOLN: 4.0000 mg | Freq: Four times a day (QID) | INTRAMUSCULAR | Status: DC | PRN

## 2023-05-01 MED ORDER — HEPARIN SODIUM (PORCINE) 5000 UNIT/ML IJ SOLN
5000.0000 [IU] | Freq: Three times a day (TID) | INTRAMUSCULAR | Status: DC
  Administered 2023-05-01 – 2023-05-02 (×4): 5000 [IU] via SUBCUTANEOUS
  Filled 2023-05-01 (×4): qty 1

## 2023-05-01 MED ORDER — SODIUM CHLORIDE 0.9 % IV BOLUS
2000.0000 mL | Freq: Once | INTRAVENOUS | Status: AC
  Administered 2023-05-01: 2000 mL via INTRAVENOUS

## 2023-05-01 MED ORDER — ONDANSETRON HCL 4 MG PO TABS: 4.0000 mg | ORAL_TABLET | Freq: Four times a day (QID) | ORAL | Status: DC | PRN

## 2023-05-01 MED ORDER — ORAL CARE MOUTH RINSE: 15.0000 mL | OROMUCOSAL | Status: DC | PRN

## 2023-05-01 MED ORDER — LACTATED RINGERS IV SOLN: INTRAVENOUS | Status: DC

## 2023-05-01 MED ORDER — ACETAMINOPHEN 325 MG PO TABS: 650.0000 mg | ORAL_TABLET | Freq: Four times a day (QID) | ORAL | Status: DC | PRN

## 2023-05-01 NOTE — Consult Note (Signed)
Renal Service Consult Note [facility]  Victor Johnson 05/01/2023 Victor Krabbe, MD Requesting Physician: Victor Johnson  Reason for Consult: Renal failure HPI: The patient is a 47 y.o. year-old w/ PMH as below who presented 6/25 to ED w/ abnormal labs done by his doctor who did an endoscopy on him yesterday. Kidney numbers were off. Pt endorsed some weakness and loss of appetite. In ED BP's were low 86/65. Pt gave hx of long time hx of N/V going on for years. In ED creat was 8. Pt was taking valsartan- hydrochlorothiazide at home. Pt was admitted. We were asked to see for renal failure.   Pt was admitted. He rec'd IV LR 3 L bolus and IV albumin 25 gm, then LR 100cc/hr overnight. This am creat is down to 6. Pt says his problems w/ N/V have been ongoing for the last "10 years".  States he is taking norvasc, and the valsartan-hydrochlorothiazide combination product for his HTN.    ROS - denies CP, no joint pain, no HA, no blurry vision, no rash, no diarrhea, no nausea/ vomiting, no dysuria, no difficulty voiding   Past Medical History  Past Medical History:  Diagnosis Date   Gastritis    Hypertension    Past Surgical History History reviewed. No pertinent surgical history. Family History History reviewed. No pertinent family history. Social History  reports that he has been smoking cigarettes. He has been smoking an average of 1 pack per day. He has never used smokeless tobacco. He reports current alcohol use. He reports current drug use. Drug: Marijuana. Allergies No Known Allergies Home medications Prior to Admission medications   Medication Sig Start Date End Date Taking? Authorizing Provider  allopurinol (ZYLOPRIM) 100 MG tablet Take 1 tablet (100 mg total) by mouth daily. 12/11/22  Yes Victor Sessions, NP  amLODipine (NORVASC) 10 MG tablet Take 1 tablet (10 mg total) by mouth daily. 12/11/22  Yes Victor Sessions, NP  cyclobenzaprine (FLEXERIL) 10 MG tablet Take 1  tablet (10 mg total) by mouth 2 (two) times daily as needed for muscle spasms. 12/11/22  Yes Victor Sessions, NP  ondansetron (ZOFRAN-ODT) 4 MG disintegrating tablet Take 1 tablet (4 mg total) by mouth every 4 (four) hours as needed for nausea or vomiting. 12/27/22  Yes Pfeiffer, Lebron Conners, MD  potassium chloride SA (KLOR-CON M) 20 MEQ tablet Take 1 tablet (20 mEq total) by mouth daily. 04/25/23  Yes Victor Helper, PA-C  promethazine (PHENERGAN) 25 MG tablet Take 1 tablet (25 mg total) by mouth every 6 (six) hours as needed for nausea or vomiting. 04/25/23  Yes Victor Helper, PA-C  valsartan-hydrochlorothiazide (DIOVAN-HCT) 160-25 MG tablet Take 1 tablet by mouth daily. 12/11/22  Yes Victor Sessions, NP     Vitals:   05/01/23 5621 05/01/23 0316 05/01/23 0913 05/01/23 1213  BP:  104/64 98/62 (!) 102/59  Pulse:  88 79 66  Resp:  18 17 16   Temp: 98.6 F (37 C) 98.3 F (36.8 C) 97.8 F (36.6 C) 98 F (36.7 C)  TempSrc: Oral Oral Oral Oral  SpO2:  100% 100% 100%  Weight:      Height:       Exam Gen alert, no distress No rash, cyanosis or gangrene Sclera anicteric, throat clear  No jvd or bruits Chest clear bilat to bases, no rales/ wheezing RRR no MRG Abd soft ntnd no mass or ascites +bs GU normal male MS no joint effusions or deformity Ext no LE or  UE edema, no wounds or ulcers Neuro is alert, Ox 3 , nf   Home meds include - zyloprim, norvasc 10, flexeril, zofran, klorcon, phenergan, valsartan- hydrochlorothiazide 160- 25 every day, prns/ vits/ supps    Date   Creat  eGFR   Jul 2021  1.01 Sep 2020  1.23   Jul -Sept 2022 0.82- 0.99     Nov 2022  1.51-1.52   Mar 2023  1.05-1.17   Sept 2023  3.29 --> 0.96 AKI episode   Feb 2024  1.00- 1.07   January 29, 2023 2.46   04/23/23  1.49   04/25/23  1.78   04/30/23  8.18   05/01/23  6.01       UA 6/20- prot > 300, sg 1.032, 11-20 rbc, 11-20 wbc    UA 6/25- prot 30, 0-5 rbc/ epis, 6-10 wbcs    UNa 34,  UCr 402     CT abd wo contrast 6/26  --> Adrenals/Urinary Tract: No adrenal nodule. No hydronephrosis. No renal calculi. No perinephric edema. Both ureters are decompressed. Partially distended urinary bladder, normal for degree of distension.      Labs 6/20 - Na 133 K+ 2.9  CO2 23  BUN 25  creat 1.78      Lab  6/25   -  Na 132  K+ 3.2  CO2 21  BUN 54  creat 8.18      Lab today   -  Na 135  K+ 3.4  CO2 24   BUN 51  creat 6.01      VS today --> BP 102/59, HR 85- 95, RR 16- 26   afeb  RA 100% sats   Assessment/ Plan: AKI - b/l creat 1.0- 1.1 from feb 2024, eGFR > 60 ml/min. Creat here was 8.1 on admission yesterday in the setting recurrent/ ongoing problems with nausea/ vomiting. BP's were quite low in ED in the 80s. Pt got 3 L bolus and BP's are now in the low 100s. Pt is taking ARB and hydrochlorothiazide for HTN control.  CT shows normal appearing kidneys and bladder, no hydro or stones. AKI is likely due to combination of hypotension + vol depletion (related to N/V and hctz) and ARB effects. Told him he is likely to regain normal renal function after an episode like this. However, in the future and/or at least as long as the GI problems persist would have him avoid ACEi, ARB's and diuretics for BP control. May use BB, hydralazine, clonidine, others. Still looks dry on exam - will bolus 2.5 L and cont LR at 100 cc/hr. F/u labs this evening and tomorrow.  HTN - as above, avoid acei/ ARB / diuretics for BP control as long as still having GI issues Refractory N/V - h/o gastritis    Victor Moselle  MD [facility] 05/01/2023, 2:23 PM  Recent Labs  Lab 04/25/23 1545 04/30/23 2146 04/30/23 2240 05/01/23 0430  HGB 14.6 13.6  --   --   ALBUMIN 4.7  --  3.8  --   CALCIUM 9.0  --  8.0* 8.5*  CREATININE 1.78*  --  8.18* 6.01*  K 2.9*  --  3.2* 3.4*   Inpatient medications:  heparin  5,000 Units Subcutaneous Q8H    lactated ringers 100 mL/hr at 05/01/23 1241   acetaminophen **OR** acetaminophen, ondansetron **OR** ondansetron (ZOFRAN) IV,  mouth rinse

## 2023-05-01 NOTE — ED Provider Notes (Signed)
[facility]LO[facility]GR[facility]IVE CARE AND UROLOGY Provider Note  CSN: [id number] Arrival date & time: 04/30/23 2059  Chief Complaint(s) Abnormal Labs and Hypotension  HPI Victor Johnson is a 47 y.o. male with PMH intractable nausea and vomiting, gastritis, recent endoscopy performed yesterday by Dr. Levora Angel of [facility] who presents emergency department for evaluation of abnormal labs and low blood pressure.  Patient reportedly received a call [facility] saying that his kidney function had significantly worsened and that he needed to come to the emergency department for further evaluation.  He states that he has had difficulty tolerating p.o. but otherwise feels fairly well.  Patient arrives with blood pressures in the 70s but otherwise stable vital signs.  Denies chest pain, shortness of breath, abdominal pain or other systemic symptoms here in the ER today.   Past Medical History Past Medical History:  Diagnosis Date   Gastritis    Hypertension    Patient Active Problem List   Diagnosis Date Noted   Intractable nausea and vomiting 07/09/2022   Acute kidney failure (HCC) 07/08/2022   Nausea and vomiting 07/08/2022   Abdominal pain 07/08/2022   Hypokalemia 07/08/2022   HTN (hypertension) 07/08/2022   Home Medication(s) Prior to Admission medications   Medication Sig Start Date End Date Taking? Authorizing Provider  allopurinol (ZYLOPRIM) 100 MG tablet Take 1 tablet (100 mg total) by mouth daily. 12/11/22  Yes Grayce Sessions, NP  amLODipine (NORVASC) 10 MG tablet Take 1 tablet (10 mg total) by mouth daily. 12/11/22  Yes Grayce Sessions, NP  cyclobenzaprine (FLEXERIL) 10 MG tablet Take 1 tablet (10 mg total) by mouth 2 (two) times daily as needed for muscle spasms. 12/11/22  Yes Grayce Sessions, NP  ondansetron (ZOFRAN-ODT) 4 MG disintegrating tablet Take 1 tablet (4 mg total) by mouth every 4 (four) hours as needed for nausea or vomiting. 12/27/22  Yes Pfeiffer, Lebron Conners,  MD  potassium chloride SA (KLOR-CON M) 20 MEQ tablet Take 1 tablet (20 mEq total) by mouth daily. 04/25/23  Yes Fayrene Helper, PA-C  promethazine (PHENERGAN) 25 MG tablet Take 1 tablet (25 mg total) by mouth every 6 (six) hours as needed for nausea or vomiting. 04/25/23  Yes Fayrene Helper, PA-C  valsartan-hydrochlorothiazide (DIOVAN-HCT) 160-25 MG tablet Take 1 tablet by mouth daily. 12/11/22  Yes Grayce Sessions, NP                                                                                                                                    Past Surgical History History reviewed. No pertinent surgical history. Family History History reviewed. No pertinent family history.  Social History Social History   Tobacco Use   Smoking status: Every Day    Packs/day: 1    Types: Cigarettes   Smokeless tobacco: Never  Substance Use Topics   Alcohol use: Yes    Comment: mikes 2 x daily  Drug use: Yes    Types: Marijuana    Comment: 2 x daily   Allergies Patient has no known allergies.  Review of Systems Review of Systems  All other systems reviewed and are negative.   Physical Exam Vital Signs  I have reviewed the triage vital signs BP (!) 102/59 (BP Location: Right Arm)   Pulse 66   Temp 98 F (36.7 C) (Oral)   Resp 16   Ht 5\' 8"  (1.727 m)   Wt 59 kg   SpO2 100%   BMI 19.77 kg/m   Physical Exam Constitutional:      General: He is not in acute distress.    Appearance: Normal appearance.  HENT:     Head: Normocephalic and atraumatic.     Nose: No congestion or rhinorrhea.  Eyes:     General:        Right eye: No discharge.        Left eye: No discharge.     Extraocular Movements: Extraocular movements intact.     Pupils: Pupils are equal, round, and reactive to light.  Cardiovascular:     Rate and Rhythm: Normal rate and regular rhythm.     Heart sounds: No murmur heard. Pulmonary:     Effort: No respiratory distress.     Breath sounds: No wheezing or rales.   Abdominal:     General: There is no distension.     Tenderness: There is no abdominal tenderness.  Musculoskeletal:        General: Normal range of motion.     Cervical back: Normal range of motion.  Skin:    General: Skin is warm and dry.  Neurological:     General: No focal deficit present.     Mental Status: He is alert.     ED Results and Treatments Labs (all labs ordered are listed, but only abnormal results are displayed) Labs Reviewed  URINALYSIS, ROUTINE W REFLEX MICROSCOPIC - Abnormal; Notable for the following components:      Result Value   Hgb urine dipstick SMALL (*)    Protein, ur 30 (*)    Bacteria, UA RARE (*)    All other components within normal limits  COMPREHENSIVE METABOLIC PANEL - Abnormal; Notable for the following components:   Sodium 132 (*)    Potassium 3.2 (*)    CO2 21 (*)    BUN 54 (*)    Creatinine, Ser 8.18 (*)    Calcium 8.0 (*)    AST 12 (*)    GFR, Estimated 8 (*)    All other components within normal limits  BASIC METABOLIC PANEL - Abnormal; Notable for the following components:   Potassium 3.4 (*)    Glucose, Bld 112 (*)    BUN 51 (*)    Creatinine, Ser 6.01 (*)    Calcium 8.5 (*)    GFR, Estimated 11 (*)    All other components within normal limits  CBG MONITORING, ED - Abnormal; Notable for the following components:   Glucose-Capillary 107 (*)    All other components within normal limits  CBC  MAGNESIUM  SODIUM, URINE, RANDOM  CREATININE, URINE, RANDOM  BASIC METABOLIC PANEL  Radiology CT ABDOMEN PELVIS WO CONTRAST  Result Date: 05/01/2023 CLINICAL DATA:  Provided history: Kidney failure, acute renal failure, concern for obstruciton Endoscopy earlier today. EXAM: CT ABDOMEN AND PELVIS WITHOUT CONTRAST TECHNIQUE: Multidetector CT imaging of the abdomen and pelvis was performed following the standard  protocol without IV contrast. RADIATION DOSE REDUCTION: This exam was performed according to the departmental dose-optimization program which includes automated exposure control, adjustment of the mA and/or kV according to patient size and/or use of iterative reconstruction technique. COMPARISON:  CT 01/29/2023 FINDINGS: Lower chest: Minor dependent atelectasis in the lung bases. Mild wall thickening of the distal esophagus. Hepatobiliary: Calcified granuloma in the right lobe of the liver. No evidence of focal liver abnormality on this unenhanced exam. Gallbladder physiologically distended, no calcified stone. No biliary dilatation. Pancreas: No ductal dilatation or inflammation. Spleen: Normal in size without focal abnormality. Adrenals/Urinary Tract: No adrenal nodule. No hydronephrosis. No renal calculi. No perinephric edema. Both ureters are decompressed. Partially distended urinary bladder, normal for degree of distension. Stomach/Bowel: Mild wall thickening of the distal esophagus. No lower pneumomediastinum. Nondistended stomach. No evidence of bowel obstruction or inflammation. Normal appendix is potentially visualized. No appendicitis. Small volume of stool in the colon. Distal colonic diverticulosis without focal diverticulitis. Vascular/Lymphatic: Normal caliber abdominal aorta, minimal atherosclerosis. No abdominopelvic adenopathy. Reproductive: Prostate is unremarkable. Other: No free air. No ascites or free fluid. No significant abdominal wall hernia. Musculoskeletal: Mild chronic change of both hips. There are no acute or suspicious osseous abnormalities. IMPRESSION: 1. No renal stones or obstructive uropathy. 2. Mild wall thickening of the distal esophagus, recommend correlation with recent endoscopy. 3. Distal colonic diverticulosis without focal diverticulitis. Aortic Atherosclerosis (ICD10-I70.0). Electronically Signed   By: Narda Rutherford M.D.   On: 05/01/2023 01:15    Pertinent labs &  imaging results that were available during my care of the patient were reviewed by me and considered in my medical decision making (see MDM for details).  Medications Ordered in ED Medications  lactated ringers infusion ( Intravenous New Bag/Given 05/01/23 1241)  heparin injection 5,000 Units (5,000 Units Subcutaneous Given 05/01/23 0645)  acetaminophen (TYLENOL) tablet 650 mg (has no administration in time range)    Or  acetaminophen (TYLENOL) suppository 650 mg (has no administration in time range)  ondansetron (ZOFRAN) tablet 4 mg (has no administration in time range)    Or  ondansetron (ZOFRAN) injection 4 mg (has no administration in time range)  Oral care mouth rinse (has no administration in time range)  lactated ringers bolus 1,000 mL (0 mLs Intravenous Stopped 04/30/23 2354)  lactated ringers bolus 1,000 mL (0 mLs Intravenous Stopped 04/30/23 2354)  lactated ringers bolus 1,000 mL (0 mLs Intravenous Stopped 05/01/23 0201)  albumin human 25 % solution 25 g (0 g Intravenous Stopped 05/01/23 0201)  potassium chloride (KLOR-CON) packet 20 mEq (20 mEq Oral Given 05/01/23 0813)  Procedures .Critical Care  Performed by: Glendora Score, MD Authorized by: Glendora Score, MD   Critical care provider statement:    Critical care time (minutes):  75   Critical care was necessary to treat or prevent imminent or life-threatening deterioration of the following conditions:  Circulatory failure and renal failure   Critical care was time spent personally by me on the following activities:  Development of treatment plan with patient or surrogate, discussions with consultants, evaluation of patient's response to treatment, examination of patient, ordering and review of laboratory studies, ordering and review of radiographic studies, ordering and performing treatments and  interventions, pulse oximetry, re-evaluation of patient's condition and review of old charts   (including critical care time)  Medical Decision Making / ED Course   This patient presents to the ED for concern of hypotension, abnormal labs, this involves an extensive number of treatment options, and is a complaint that carries with it a high risk of complications and morbidity.  The differential diagnosis includes obstructive uropathy, prerenal AKI, ATN from recent anesthesia , medical renal disease, dehydration, septic shock, obstructive shock  MDM: Patient seen emergency room for evaluation of abnormal labs.  Physical exam is largely unremarkable.  Laboratory evaluation with a significant new creatinine elevation to 8.18 from a baseline of 1.78 just 5 days ago, BUN 54, potassium 3.2.  P hypotension starting to improve with aggressive fluid resuscitation and after 2 L lactated Ringer's, did achieve maps over 65 but on second reevaluation patient started to become hypotensive again.  1 additional liter with albumin was ordered but if patient continues to be hypotensive despite this likely will require peripheral vasopressor initiation.  Bedside PVR approximately 200 cc and patient was able to urinate twice.  CT abdomen pelvis will be obtained to rule out obstructive uropathy and I spoke with Dr. Allena Katz of nephrology who states that the morning team will come to evaluate the patient.  At time of signout.  Patient pending CT abdomen pelvis and reevaluation after third liter.  Please see provider signout for continuation of workup.  Patient will require hospital admission today.   Additional history obtained: -Additional history obtained from wife -External records from outside source obtained and reviewed including: Chart review including previous notes, labs, imaging, consultation notes   Lab Tests: -I ordered, reviewed, and interpreted labs.   The pertinent results include:   Labs Reviewed   URINALYSIS, ROUTINE W REFLEX MICROSCOPIC - Abnormal; Notable for the following components:      Result Value   Hgb urine dipstick SMALL (*)    Protein, ur 30 (*)    Bacteria, UA RARE (*)    All other components within normal limits  COMPREHENSIVE METABOLIC PANEL - Abnormal; Notable for the following components:   Sodium 132 (*)    Potassium 3.2 (*)    CO2 21 (*)    BUN 54 (*)    Creatinine, Ser 8.18 (*)    Calcium 8.0 (*)    AST 12 (*)    GFR, Estimated 8 (*)    All other components within normal limits  BASIC METABOLIC PANEL - Abnormal; Notable for the following components:   Potassium 3.4 (*)    Glucose, Bld 112 (*)    BUN 51 (*)    Creatinine, Ser 6.01 (*)    Calcium 8.5 (*)    GFR, Estimated 11 (*)    All other components within normal limits  CBG MONITORING, ED - Abnormal; Notable for the following components:  Glucose-Capillary 107 (*)    All other components within normal limits  CBC  MAGNESIUM  SODIUM, URINE, RANDOM  CREATININE, URINE, RANDOM  BASIC METABOLIC PANEL      EKG   EKG Interpretation  Date/Time:  Tuesday April 30 2023 21:31:08 EDT Ventricular Rate:  101 PR Interval:  131 QRS Duration: 94 QT Interval:  336 QTC Calculation: 436 R Axis:   80 Text Interpretation: Sinus tachycardia Probable left atrial enlargement Nonspecific T abnormalities, diffuse leads Confirmed by Tilden Fossa [phone]) on 05/01/2023 12:44:55 AM         Imaging Studies ordered: I ordered imaging studies including CT abdomen pelvis and this is pending   Medicines ordered and prescription drug management: Meds ordered this encounter  Medications   lactated ringers bolus 1,000 mL   lactated ringers bolus 1,000 mL   lactated ringers bolus 1,000 mL   albumin human 25 % solution 25 g   lactated ringers infusion   heparin injection 5,000 Units   OR Linked Order Group    acetaminophen (TYLENOL) tablet 650 mg    acetaminophen (TYLENOL) suppository 650 mg   OR Linked  Order Group    ondansetron (ZOFRAN) tablet 4 mg    ondansetron (ZOFRAN) injection 4 mg   potassium chloride (KLOR-CON) packet 20 mEq   Oral care mouth rinse    -I have reviewed the patients home medicines and have made adjustments as needed  Critical interventions Fluid resuscitation, albumin  Consultations Obtained: I requested consultation with the nephrologist Dr. Allena Katz,  and discussed lab and imaging findings as well as pertinent plan - they recommend: Hospital admission fluid resuscitation   Cardiac Monitoring: The patient was maintained on a cardiac monitor.  I personally viewed and interpreted the cardiac monitored which showed an underlying rhythm of: NSR  Social Determinants of Health:  Factors impacting patients care include: none   Reevaluation: After the interventions noted above, I reevaluated the patient and found that they have :improved  Co morbidities that complicate the patient evaluation  Past Medical History:  Diagnosis Date   Gastritis    Hypertension       Dispostion: I considered admission for this patient, and due to significant new AKI and hypotension patient require hospital admission     Final Clinical Impression(s) / ED Diagnoses Final diagnoses:  None     @PCDICTATION @    Glendora Score, MD 05/01/23 1328

## 2023-05-01 NOTE — Assessment & Plan Note (Addendum)
Repeat BMP pending now after IVF boluses. Replace K if low on repeat. Check Mg

## 2023-05-01 NOTE — Assessment & Plan Note (Addendum)
Ongoing more chronically, being worked up by GI. DDx includes canabis hyperemesis syndrome, vs other cause. CT shows distal esophageal thickening today. Call [facility] if persistent during stay, looks like he is currently being worked up for chronic N/V by Dr. Levora Angel in office, seen 6/24 it looks like. Had EGD yesterday per patient, so presumably gastroenterology has already looked at this distal esophageal thickening.

## 2023-05-01 NOTE — Assessment & Plan Note (Signed)
Hold home BP meds in setting of hypotension in ED.

## 2023-05-01 NOTE — ED Provider Notes (Signed)
Care assumed at midnight pending re-evaluation following IVF bolus.  Pt here with acute renal failure, recently had significant vomiting.    Patients BP continues to be improved after third fluid bolus with maps in the upper 60s.  He does appear dehydrated.  Will start maintenance fluids.  Medicine consulted for admission for ongoing care.     Tilden Fossa, MD 05/01/23 [id number]

## 2023-05-01 NOTE — Progress Notes (Signed)
PROGRESS NOTE   Victor Johnson  ZOX:[id number]    DOB: 26-Apr-1976    DOA: 04/30/2023  PCP: Grayce Sessions, NP   I have briefly reviewed patients previous medical records in [facility].  Chief Complaint  Patient presents with   Abnormal Labs   Hypotension    Brief Narrative:  47 year old male with PMH of HTN, gastritis, intractable nausea/vomiting ongoing chronically for a number of years per H&P, was supposed to have EGD and colonoscopy on 6/20 but could not keep down the colon prep, did undergo EGD 6/25 which showed esophagitis, esophageal ulcer and gastritis (as per discussion with Dr. Levora Angel on 6/26), called back by MD later that day and advised to go to ED due to acute renal failure.  Main ED hypotensive with initial BP of 86/65.  Creatinine >8.  Nephrology consulted.   Assessment & Plan:  Principal Problem:   Acute kidney failure (HCC) Active Problems:   Hypokalemia   HTN (hypertension)   Intractable nausea and vomiting   Acute kidney injury: Baseline creatinine 1-1 0.1 in February 2024. Presented with creatinine of 8.1. Multifactorial etiology: Hypotension with possible ATN, prerenal from poor oral intake and intravascular volume depletion, meds including ARB and HCTZ. CT without hydronephrosis. In the ED got 3 L IV fluid bolus, IV albumin 25 g and placed on maintenance LR 100 mL/h. Creatinine has improved from 8.18-6.01. Nephrology input appreciated, planning to rebolus with IV fluids and continue maintenance IV fluids due to appearing dry. Patient wishes to advance diet, discussed with Dr. Levora Angel and initiated renal diet. Also per nephrology, while he still having ongoing GI issues i.e. chronic nausea and vomiting, told to avoid ACEI, ARB's and diuretics due to risk of recurrent AKI.  Hypertension/hypotension: Blood pressures soft but better than on admission.  Discontinue ACEI, ARB, diuretics indefinitely for now until GI issues completely  resolved. Could consider beta-blockers, hydralazine, clonidine or others.  Refractory nausea, vomiting, esophagitis/esophageal ulcer/gastritis: Per discussion with Dr. Levora Angel, [facility] 6/26: EGD 6/25 showed above findings.  Recommends twice daily PPI but holding Carafate due to acute renal failure. Absolute cessation of THC. Diet as tolerated GI will arrange for colonoscopy in a month's time.  Hypokalemia: Replace and follow  Polysubstance abuse (tobacco and THC: Cessation counseled.  Patient declines nicotine patch.  Body mass index is 19.77 kg/m.   ACP Documents: None present. DVT prophylaxis: heparin injection 5,000 Units Start: 05/01/23 0600     Code Status: Full Code:  Family Communication: Spouse sleeping at bedside and did not wake up during the course of my evaluation. Disposition:  Status is: Inpatient Remains inpatient appropriate because: Acute renal failure, IV fluids etc.     Consultants:   Nephrology.  Procedures:     Antimicrobials:      Subjective:  Feeling much better.  No nausea or vomiting.  Last BM was 3 days PTA.  Passing flatus.  No abdominal pain.  Later in the afternoon, requested that his diet be advanced to regular consistency diet.  Patient reports that he smokes 1 pack of cigarettes per day and 2 blunts/THC daily.  Objective:   Vitals:   05/01/23 0218 05/01/23 0316 05/01/23 0913 05/01/23 1213  BP:  104/64 98/62 (!) 102/59  Pulse:  88 79 66  Resp:  18 17 16   Temp: 98.6 F (37 C) 98.3 F (36.8 C) 97.8 F (36.6 C) 98 F (36.7 C)  TempSrc: Oral Oral Oral Oral  SpO2:  100% 100% 100%  Weight:  Height:        General exam: Young male, moderately built and nourished lying comfortably supine in bed without distress.  Oral mucosa with borderline hydration. Respiratory system: Clear to auscultation. Respiratory effort normal. Cardiovascular system: S1 & S2 heard, RRR. No JVD, murmurs, rubs, gallops or clicks. No pedal edema.   Telemetry personally reviewed: Sinus rhythm. Gastrointestinal system: Abdomen is nondistended, soft and nontender. No organomegaly or masses felt. Normal bowel sounds heard. Central nervous system: Alert and oriented. No focal neurological deficits. Extremities: Symmetric 5 x 5 power. Skin: No rashes, lesions or ulcers Psychiatry: Judgement and insight appear normal. Mood & affect appropriate.     Data Reviewed:   I have personally reviewed following labs and imaging studies   CBC: Recent Labs  Lab 04/25/23 1545 04/30/23 2146  WBC 11.1* 8.1  HGB 14.6 13.6  HCT 42.2 41.3  MCV 92.5 97.6  PLT 442* 393    Basic Metabolic Panel: Recent Labs  Lab 04/25/23 1545 04/30/23 2240 05/01/23 0430  NA 133* 132* 135  K 2.9* 3.2* 3.4*  CL 99 98 101  CO2 23 21* 24  GLUCOSE 136* 94 112*  BUN 25* 54* 51*  CREATININE 1.78* 8.18* 6.01*  CALCIUM 9.0 8.0* 8.5*  MG  --   --  1.9    Liver Function Tests: Recent Labs  Lab 04/25/23 1545 04/30/23 2240  AST 17 12*  ALT 16 13  ALKPHOS 64 50  BILITOT 1.5* 0.6  PROT 8.4* 6.5  ALBUMIN 4.7 3.8    CBG: Recent Labs  Lab 04/30/23 2150  GLUCAP 107*    Microbiology Studies:  No results found for this or any previous visit (from the past 240 hour(s)).  Radiology Studies:  CT ABDOMEN PELVIS WO CONTRAST  Result Date: 05/01/2023 CLINICAL DATA:  Provided history: Kidney failure, acute renal failure, concern for obstruciton Endoscopy earlier today. EXAM: CT ABDOMEN AND PELVIS WITHOUT CONTRAST TECHNIQUE: Multidetector CT imaging of the abdomen and pelvis was performed following the standard protocol without IV contrast. RADIATION DOSE REDUCTION: This exam was performed according to the departmental dose-optimization program which includes automated exposure control, adjustment of the mA and/or kV according to patient size and/or use of iterative reconstruction technique. COMPARISON:  CT 01/29/2023 FINDINGS: Lower chest: Minor dependent  atelectasis in the lung bases. Mild wall thickening of the distal esophagus. Hepatobiliary: Calcified granuloma in the right lobe of the liver. No evidence of focal liver abnormality on this unenhanced exam. Gallbladder physiologically distended, no calcified stone. No biliary dilatation. Pancreas: No ductal dilatation or inflammation. Spleen: Normal in size without focal abnormality. Adrenals/Urinary Tract: No adrenal nodule. No hydronephrosis. No renal calculi. No perinephric edema. Both ureters are decompressed. Partially distended urinary bladder, normal for degree of distension. Stomach/Bowel: Mild wall thickening of the distal esophagus. No lower pneumomediastinum. Nondistended stomach. No evidence of bowel obstruction or inflammation. Normal appendix is potentially visualized. No appendicitis. Small volume of stool in the colon. Distal colonic diverticulosis without focal diverticulitis. Vascular/Lymphatic: Normal caliber abdominal aorta, minimal atherosclerosis. No abdominopelvic adenopathy. Reproductive: Prostate is unremarkable. Other: No free air. No ascites or free fluid. No significant abdominal wall hernia. Musculoskeletal: Mild chronic change of both hips. There are no acute or suspicious osseous abnormalities. IMPRESSION: 1. No renal stones or obstructive uropathy. 2. Mild wall thickening of the distal esophagus, recommend correlation with recent endoscopy. 3. Distal colonic diverticulosis without focal diverticulitis. Aortic Atherosclerosis (ICD10-I70.0). Electronically Signed   By: Narda Rutherford M.D.   On: 05/01/2023 01:15  Scheduled Meds:    heparin  5,000 Units Subcutaneous Q8H    Continuous Infusions:    lactated ringers 100 mL/hr at 05/01/23 1241   sodium chloride       LOS: 0 days     Marcellus Scott, MD,  FACP, [facility], [facility], [facility], [facility]   [facility] [place]     To contact the attending provider between 7A-7P or the covering  provider during after hours 7P-7A, please log into the web site [email or web address] and access using universal [place] password for that web site. If you do not have the password, please call the hospital operator.  05/01/2023, 3:37 PM

## 2023-05-01 NOTE — Assessment & Plan Note (Addendum)
Pre-renal / ATN seems most likely with extensive history of intractable N/V, superimposed on this is further dehydration from the attempted bowel prep he did for colonoscopy ~5 days ago.  Noted to have very soft BPs initially in ED today which is not baseline for this patient who is normally on HTN meds. IVF: 3L bolus in ED and ongoing LR at 100 CC/HR ordered Strict intake and output EDP d/w Dr. Allena Katz CT reveals no obstructive uropathy

## 2023-05-01 NOTE — H&P (Signed)
History and Physical    Patient: Victor Johnson BJS:[id number] DOB: 1976-01-09 DOA: 04/30/2023 DOS: the patient was seen and examined on 05/01/2023 PCP: Grayce Sessions, NP  Patient coming from: Home  Chief Complaint:  Chief Complaint  Patient presents with   Abnormal Labs   Hypotension   HPI: Victor Johnson is a 47 y.o. 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  HTN.  N/V being worked up by Dr. Levora Angel.  Pt was scheduled for colonoscopy on 6/20 and was attempting to do bowel prep.  Seen in ED for N/V/D and LLQ abd pain on 6/20.  Mild AKI that day with creat 1.78 up from a 1.0 baseline in Feb it looks like, though creat had been 1.5 x2 days earlier when seen in ED for same and 2.4 back in March.  Pt had EGD today.  Called by doctor this evening and told to come to ED due to elevated renal fxn labs.  Pt hypotensive in ED initially with BP 86/65 (73).  Creat 8.x today.  Review of Systems: As mentioned in the history of present illness. All other systems reviewed and are negative. Past Medical History:  Diagnosis Date   Gastritis    Hypertension    History reviewed. No pertinent surgical history. Social History:  reports that he has been smoking cigarettes. He has been smoking an average of 1 pack per day. He has never used smokeless tobacco. He reports current alcohol use. He reports current drug use. Drug: Marijuana.  No Known Allergies  History reviewed. No pertinent family history.  Prior to Admission medications   Medication Sig Start Date End Date Taking? Authorizing Provider  allopurinol (ZYLOPRIM) 100 MG tablet Take 1 tablet (100 mg total) by mouth daily. 12/11/22  Yes Grayce Sessions, NP  amLODipine (NORVASC) 10 MG tablet Take 1 tablet (10 mg total) by mouth daily. 12/11/22  Yes Grayce Sessions, NP  cyclobenzaprine (FLEXERIL) 10 MG tablet Take 1 tablet (10 mg total) by mouth 2 (two)  times daily as needed for muscle spasms. 12/11/22  Yes Grayce Sessions, NP  ondansetron (ZOFRAN-ODT) 4 MG disintegrating tablet Take 1 tablet (4 mg total) by mouth every 4 (four) hours as needed for nausea or vomiting. 12/27/22  Yes Pfeiffer, Lebron Conners, MD  potassium chloride SA (KLOR-CON M) 20 MEQ tablet Take 1 tablet (20 mEq total) by mouth daily. 04/25/23  Yes Fayrene Helper, PA-C  promethazine (PHENERGAN) 25 MG tablet Take 1 tablet (25 mg total) by mouth every 6 (six) hours as needed for nausea or vomiting. 04/25/23  Yes Fayrene Helper, PA-C  valsartan-hydrochlorothiazide (DIOVAN-HCT) 160-25 MG tablet Take 1 tablet by mouth daily. 12/11/22  Yes Grayce Sessions, NP    Physical Exam: Vitals:   05/01/23 0015 05/01/23 0045 05/01/23 0115 05/01/23 0218  BP: (!) 84/64 (!) 89/55 (!) 95/57   Pulse: 100 93 92   Resp: 17 (!) 23 15   Temp:    98.6 F (37 C)  TempSrc:    Oral  SpO2: 100% 100% 100%   Weight:      Height:       Constitutional: NAD, calm, comfortable Respiratory: clear to auscultation bilaterally, no wheezing, no crackles. Normal respiratory effort. No accessory muscle use.  Cardiovascular: Regular rate and rhythm, no murmurs / rubs / gallops. No extremity edema. 2+ pedal pulses. No carotid bruits.  Abdomen: no tenderness, no masses palpated. No hepatosplenomegaly. Bowel sounds positive.  Neurologic:  CN 2-12 grossly intact. Sensation intact, DTR normal. Strength 5/5 in all 4.  Psychiatric: Normal judgment and insight. Alert and oriented x 3. Normal mood.   Data Reviewed:    Labs on Admission: I have personally reviewed following labs and imaging studies  CBC: Recent Labs  Lab 04/25/23 1545 04/30/23 2146  WBC 11.1* 8.1  HGB 14.6 13.6  HCT 42.2 41.3  MCV 92.5 97.6  PLT 442* 393   Basic Metabolic Panel: Recent Labs  Lab 04/25/23 1545 04/30/23 2240  NA 133* 132*  K 2.9* 3.2*  CL 99 98  CO2 23 21*  GLUCOSE 136* 94  BUN 25* 54*  CREATININE 1.78* 8.18*  CALCIUM 9.0 8.0*    GFR: Estimated Creatinine Clearance: 9.5 mL/min (A) (by C-G formula based on SCr of 8.18 mg/dL (H)). Liver Function Tests: Recent Labs  Lab 04/25/23 1545 04/30/23 2240  AST 17 12*  ALT 16 13  ALKPHOS 64 50  BILITOT 1.5* 0.6  PROT 8.4* 6.5  ALBUMIN 4.7 3.8   Recent Labs  Lab 04/25/23 1545  LIPASE 52*   No results for input(s): "AMMONIA" in the last 168 hours. Coagulation Profile: No results for input(s): "INR", "PROTIME" in the last 168 hours. Cardiac Enzymes: No results for input(s): "CKTOTAL", "CKMB", "CKMBINDEX", "TROPONINI" in the last 168 hours. BNP (last 3 results) No results for input(s): "PROBNP" in the last 8760 hours. HbA1C: No results for input(s): "HGBA1C" in the last 72 hours. CBG: Recent Labs  Lab 04/30/23 2150  GLUCAP 107*   Lipid Profile: No results for input(s): "CHOL", "HDL", "LDLCALC", "TRIG", "CHOLHDL", "LDLDIRECT" in the last 72 hours. Thyroid Function Tests: No results for input(s): "TSH", "T4TOTAL", "FREET4", "T3FREE", "THYROIDAB" in the last 72 hours. Anemia Panel: No results for input(s): "VITAMINB12", "FOLATE", "FERRITIN", "TIBC", "IRON", "RETICCTPCT" in the last 72 hours. Urine analysis:    Component Value Date/Time   COLORURINE YELLOW 04/30/2023 2300   APPEARANCEUR CLEAR 04/30/2023 2300   LABSPEC 1.015 04/30/2023 2300   PHURINE 5.0 04/30/2023 2300   GLUCOSEU NEGATIVE 04/30/2023 2300   HGBUR SMALL (A) 04/30/2023 2300   BILIRUBINUR NEGATIVE 04/30/2023 2300   KETONESUR NEGATIVE 04/30/2023 2300   PROTEINUR 30 (A) 04/30/2023 2300   NITRITE NEGATIVE 04/30/2023 2300   LEUKOCYTESUR NEGATIVE 04/30/2023 2300    Radiological Exams on Admission: CT ABDOMEN PELVIS WO CONTRAST  Result Date: 05/01/2023 CLINICAL DATA:  Provided history: Kidney failure, acute renal failure, concern for obstruciton Endoscopy earlier today. EXAM: CT ABDOMEN AND PELVIS WITHOUT CONTRAST TECHNIQUE: Multidetector CT imaging of the abdomen and pelvis was performed  following the standard protocol without IV contrast. RADIATION DOSE REDUCTION: This exam was performed according to the departmental dose-optimization program which includes automated exposure control, adjustment of the mA and/or kV according to patient size and/or use of iterative reconstruction technique. COMPARISON:  CT 01/29/2023 FINDINGS: Lower chest: Minor dependent atelectasis in the lung bases. Mild wall thickening of the distal esophagus. Hepatobiliary: Calcified granuloma in the right lobe of the liver. No evidence of focal liver abnormality on this unenhanced exam. Gallbladder physiologically distended, no calcified stone. No biliary dilatation. Pancreas: No ductal dilatation or inflammation. Spleen: Normal in size without focal abnormality. Adrenals/Urinary Tract: No adrenal nodule. No hydronephrosis. No renal calculi. No perinephric edema. Both ureters are decompressed. Partially distended urinary bladder, normal for degree of distension. Stomach/Bowel: Mild wall thickening of the distal esophagus. No lower pneumomediastinum. Nondistended stomach. No evidence of bowel obstruction or inflammation. Normal appendix is potentially visualized. No appendicitis. Small volume  of stool in the colon. Distal colonic diverticulosis without focal diverticulitis. Vascular/Lymphatic: Normal caliber abdominal aorta, minimal atherosclerosis. No abdominopelvic adenopathy. Reproductive: Prostate is unremarkable. Other: No free air. No ascites or free fluid. No significant abdominal wall hernia. Musculoskeletal: Mild chronic change of both hips. There are no acute or suspicious osseous abnormalities. IMPRESSION: 1. No renal stones or obstructive uropathy. 2. Mild wall thickening of the distal esophagus, recommend correlation with recent endoscopy. 3. Distal colonic diverticulosis without focal diverticulitis. Aortic Atherosclerosis (ICD10-I70.0). Electronically Signed   By: Narda Rutherford M.D.   On: 05/01/2023 01:15     EKG: Independently reviewed.   Assessment and Plan: * Acute kidney failure (HCC) Pre-renal / ATN seems most likely with extensive history of intractable N/V, superimposed on this is further dehydration from the attempted bowel prep he did for colonoscopy ~5 days ago.  Noted to have very soft BPs initially in ED today which is not baseline for this patient who is normally on HTN meds. IVF: 3L bolus in ED and ongoing LR at 100 CC/HR ordered Strict intake and output EDP d/w Dr. Allena Katz CT reveals no obstructive uropathy  Intractable nausea and vomiting Ongoing more chronically, being worked up by GI. DDx includes canabis hyperemesis syndrome, vs other cause. CT shows distal esophageal thickening today. Call [facility] if persistent during stay, looks like he is currently being worked up for chronic N/V by Dr. Levora Angel in office, seen 6/24 it looks like. Had EGD yesterday per patient, so presumably gastroenterology has already looked at this distal esophageal thickening.  HTN (hypertension) Hold home BP meds in setting of hypotension in ED.  Hypokalemia Repeat BMP pending now after IVF boluses. Replace K if low on repeat. Check Mg      Advance Care Planning:   Code Status: Full Code  Consults: Dr. Allena Katz  Family Communication: Family at bedside  Severity of Illness: The appropriate patient status for this patient is INPATIENT. Inpatient status is judged to be reasonable and necessary in order to provide the required intensity of service to ensure the patient's safety. The patient's presenting symptoms, physical exam findings, and initial radiographic and laboratory data in the context of their chronic comorbidities is felt to place them at high risk for further clinical deterioration. Furthermore, it is not anticipated that the patient will be medically stable for discharge from the hospital within 2 midnights of admission.   * I certify that at the point of admission it is my  clinical judgment that the patient will require inpatient hospital care spanning beyond 2 midnights from the point of admission due to high intensity of service, high risk for further deterioration and high frequency of surveillance required.*  Author: Hillary Bow., DO 05/01/2023 2:39 AM  For on call review [email or web address].

## 2023-05-01 NOTE — ED Notes (Addendum)
ED TO INPATIENT HANDOFF REPORT  Name/Age/Gender Victor Johnson 47 y.o. male  Code Status    Code Status Orders  (From admission, onward)           Start     Ordered   05/01/23 0230  Full code  Continuous       Question:  By:  Answer:  Consent: discussion documented in EHR   05/01/23 0235           Code Status History     Date Active Date Inactive Code Status Order ID Comments User Context   07/08/2022 2128 07/11/2022 1652 Full Code [id number]  Teddy Spike, DO Inpatient       Home/SNF/Other Home  Chief Complaint Acute kidney failure (HCC) [N17.9]  Level of Care/Admitting Diagnosis ED Disposition     ED Disposition  Admit   Condition  --   Comment  Hospital Area: [facility] [[id number]]  Level of Care: Progressive [102]  Admit to Progressive based on following criteria: NEPHROLOGY stable condition requiring close monitoring for AKI, requiring Hemodialysis or Peritoneal Dialysis either from expected electrolyte imbalance, acidosis, or fluid overload that can be managed by NIPPV or high flow oxygen.  May admit patient to [facility] or [facility] if equivalent level of care is available:: No  Covid Evaluation: Asymptomatic - no recent exposure (last 10 days) testing not required  Diagnosis: Acute kidney failure [facility]) [[id number]]  Admitting Physician: Wyvonnia Dusky  Attending Physician: Hillary Bow [phone]  Certification:: I certify this patient will need inpatient services for at least 2 midnights  Estimated Length of Stay: 4          Medical History Past Medical History:  Diagnosis Date   Gastritis    Hypertension     Allergies No Known Allergies  IV Location/Drains/Wounds Patient Lines/Drains/Airways Status     Active Line/Drains/Airways     Name Placement date Placement time Site Days   Peripheral IV 04/30/23 18 G 1.16" Anterior;Proximal;Right Forearm 04/30/23  2145  Forearm  1   Peripheral IV 04/30/23 20 G  Anterior;Left Forearm 04/30/23  2210  Forearm  1            Labs/Imaging Results for orders placed or performed during the hospital encounter of 04/30/23 (from the past 48 hour(s))  CBC     Status: None   Collection Time: 04/30/23  9:46 PM  Result Value Ref Range   WBC 8.1 4.0 - 10.5 K/uL   RBC 4.23 4.22 - 5.81 MIL/uL   Hemoglobin 13.6 13.0 - 17.0 g/dL   HCT 82.9 56.2 - 13.0 %   MCV 97.6 80.0 - 100.0 fL   MCH 32.2 26.0 - 34.0 pg   MCHC 32.9 30.0 - 36.0 g/dL   RDW 86.5 78.4 - 69.6 %   Platelets 393 150 - 400 K/uL   nRBC 0.0 0.0 - 0.2 %    Comment: Performed at [facility], [address] [place], [place] [address]  CBG monitoring, ED     Status: Abnormal   Collection Time: 04/30/23  9:50 PM  Result Value Ref Range   Glucose-Capillary 107 (H) 70 - 99 mg/dL    Comment: Glucose reference range applies only to samples taken after fasting for at least 8 hours.  Comprehensive metabolic panel     Status: Abnormal   Collection Time: 04/30/23 10:40 PM  Result Value Ref Range   Sodium 132 (L) 135 - 145 mmol/L  Potassium 3.2 (L) 3.5 - 5.1 mmol/L   Chloride 98 98 - 111 mmol/L   CO2 21 (L) 22 - 32 mmol/L   Glucose, Bld 94 70 - 99 mg/dL    Comment: Glucose reference range applies only to samples taken after fasting for at least 8 hours.   BUN 54 (H) 6 - 20 mg/dL   Creatinine, Ser 0.98 (H) 0.61 - 1.24 mg/dL   Calcium 8.0 (L) 8.9 - 10.3 mg/dL   Total Protein 6.5 6.5 - 8.1 g/dL   Albumin 3.8 3.5 - 5.0 g/dL   AST 12 (L) 15 - 41 U/L   ALT 13 0 - 44 U/L   Alkaline Phosphatase 50 38 - 126 U/L   Total Bilirubin 0.6 0.3 - 1.2 mg/dL   GFR, Estimated 8 (L) >60 mL/min    Comment: (NOTE) Calculated using the CKD-EPI Creatinine Equation (2021)    Anion gap 13 5 - 15    Comment: Performed at [facility], [address], [place], [place] [address]  Urinalysis, Routine w reflex microscopic -Urine, Clean Catch     Status: Abnormal   Collection Time:  04/30/23 11:00 PM  Result Value Ref Range   Color, Urine YELLOW YELLOW   APPearance CLEAR CLEAR   Specific Gravity, Urine 1.015 1.005 - 1.030   pH 5.0 5.0 - 8.0   Glucose, UA NEGATIVE NEGATIVE mg/dL   Hgb urine dipstick SMALL (A) NEGATIVE   Bilirubin Urine NEGATIVE NEGATIVE   Ketones, ur NEGATIVE NEGATIVE mg/dL   Protein, ur 30 (A) NEGATIVE mg/dL   Nitrite NEGATIVE NEGATIVE   Leukocytes,Ua NEGATIVE NEGATIVE   RBC / HPF 0-5 0 - 5 RBC/hpf   WBC, UA 6-10 0 - 5 WBC/hpf   Bacteria, UA RARE (A) NONE SEEN   Squamous Epithelial / HPF 0-5 0 - 5 /HPF   Mucus PRESENT    Hyaline Casts, UA PRESENT     Comment: Performed at [facility], [address] [place], [place] [address]   CT ABDOMEN PELVIS WO CONTRAST  Result Date: 05/01/2023 CLINICAL DATA:  Provided history: Kidney failure, acute renal failure, concern for obstruciton Endoscopy earlier today. EXAM: CT ABDOMEN AND PELVIS WITHOUT CONTRAST TECHNIQUE: Multidetector CT imaging of the abdomen and pelvis was performed following the standard protocol without IV contrast. RADIATION DOSE REDUCTION: This exam was performed according to the departmental dose-optimization program which includes automated exposure control, adjustment of the mA and/or kV according to patient size and/or use of iterative reconstruction technique. COMPARISON:  CT 01/29/2023 FINDINGS: Lower chest: Minor dependent atelectasis in the lung bases. Mild wall thickening of the distal esophagus. Hepatobiliary: Calcified granuloma in the right lobe of the liver. No evidence of focal liver abnormality on this unenhanced exam. Gallbladder physiologically distended, no calcified stone. No biliary dilatation. Pancreas: No ductal dilatation or inflammation. Spleen: Normal in size without focal abnormality. Adrenals/Urinary Tract: No adrenal nodule. No hydronephrosis. No renal calculi. No perinephric edema. Both ureters are decompressed. Partially distended urinary bladder,  normal for degree of distension. Stomach/Bowel: Mild wall thickening of the distal esophagus. No lower pneumomediastinum. Nondistended stomach. No evidence of bowel obstruction or inflammation. Normal appendix is potentially visualized. No appendicitis. Small volume of stool in the colon. Distal colonic diverticulosis without focal diverticulitis. Vascular/Lymphatic: Normal caliber abdominal aorta, minimal atherosclerosis. No abdominopelvic adenopathy. Reproductive: Prostate is unremarkable. Other: No free air. No ascites or free fluid. No significant abdominal wall hernia. Musculoskeletal: Mild chronic change of both hips. There are no  acute or suspicious osseous abnormalities. IMPRESSION: 1. No renal stones or obstructive uropathy. 2. Mild wall thickening of the distal esophagus, recommend correlation with recent endoscopy. 3. Distal colonic diverticulosis without focal diverticulitis. Aortic Atherosclerosis (ICD10-I70.0). Electronically Signed   By: Narda Rutherford M.D.   On: 05/01/2023 01:15    Pending Labs Unresulted Labs (From admission, onward)     Start     Ordered   05/01/23 1600  Basic metabolic panel  Once-Timed,   TIMED        05/01/23 0235   05/01/23 0220  Basic metabolic panel  Once,   STAT        05/01/23 0219            Vitals/Pain Today's Vitals   05/01/23 0015 05/01/23 0045 05/01/23 0115 05/01/23 0218  BP: (!) 84/64 (!) 89/55 (!) 95/57   Pulse: 100 93 92   Resp: 17 (!) 23 15   Temp:    98.6 F (37 C)  TempSrc:    Oral  SpO2: 100% 100% 100%   Weight:      Height:      PainSc:        Isolation Precautions No active isolations  Medications Medications  lactated ringers infusion (has no administration in time range)  heparin injection 5,000 Units (has no administration in time range)  acetaminophen (TYLENOL) tablet 650 mg (has no administration in time range)    Or  acetaminophen (TYLENOL) suppository 650 mg (has no administration in time range)  ondansetron  (ZOFRAN) tablet 4 mg (has no administration in time range)    Or  ondansetron (ZOFRAN) injection 4 mg (has no administration in time range)  lactated ringers bolus 1,000 mL (0 mLs Intravenous Stopped 04/30/23 2354)  lactated ringers bolus 1,000 mL (0 mLs Intravenous Stopped 04/30/23 2354)  lactated ringers bolus 1,000 mL (0 mLs Intravenous Stopped 05/01/23 0201)  albumin human 25 % solution 25 g (0 g Intravenous Stopped 05/01/23 0201)    Mobility walks

## 2023-05-01 NOTE — Plan of Care (Signed)
  Problem: Education: Goal: Knowledge of General Education information will improve Description: Including pain rating scale, medication(s)/side effects and non-pharmacologic comfort measures Outcome: Progressing   Problem: Clinical Measurements: Goal: Ability to maintain clinical measurements within normal limits will improve Outcome: Progressing Goal: Will remain free from infection Outcome: Progressing Goal: Diagnostic test results will improve Outcome: Progressing Goal: Respiratory complications will improve Outcome: Progressing Goal: Cardiovascular complication will be avoided Outcome: Progressing   Problem: Activity: Goal: Risk for activity intolerance will decrease Outcome: Progressing   Problem: Nutrition: Goal: Adequate nutrition will be maintained Outcome: Progressing   Problem: Coping: Goal: Level of anxiety will decrease Outcome: Progressing   Problem: Elimination: Goal: Will not experience complications related to bowel motility Outcome: Progressing   

## 2023-05-02 ENCOUNTER — Other Ambulatory Visit (HOSPITAL_COMMUNITY): Payer: Self-pay

## 2023-05-02 DIAGNOSIS — N170 Acute kidney failure with tubular necrosis: Secondary | ICD-10-CM | POA: Diagnosis not present

## 2023-05-02 LAB — BASIC METABOLIC PANEL
Anion gap: 7 (ref 5–15)
BUN: 34 mg/dL — ABNORMAL HIGH (ref 6–20)
CO2: 25 mmol/L (ref 22–32)
Calcium: 8.4 mg/dL — ABNORMAL LOW (ref 8.9–10.3)
Chloride: 106 mmol/L (ref 98–111)
Creatinine, Ser: 1.49 mg/dL — ABNORMAL HIGH (ref 0.61–1.24)
GFR, Estimated: 59 mL/min — ABNORMAL LOW (ref >60)
Glucose, Bld: 83 mg/dL (ref 70–99)
Potassium: 4.1 mmol/L (ref 3.5–5.1)
Sodium: 138 mmol/L (ref 135–145)

## 2023-05-02 MED ORDER — SUCRALFATE 1 G PO TABS
1.0000 g | ORAL_TABLET | Freq: Three times a day (TID) | ORAL | 0 refills | Status: DC
  Filled 2023-05-02: qty 56, 14d supply, fill #0

## 2023-05-02 MED ORDER — PANTOPRAZOLE SODIUM 40 MG PO TBEC
40.0000 mg | DELAYED_RELEASE_TABLET | Freq: Two times a day (BID) | ORAL | 1 refills | Status: DC
  Filled 2023-05-02: qty 60, 30d supply, fill #0

## 2023-05-02 NOTE — Discharge Summary (Addendum)
Physician Discharge Summary  Victor Johnson RUE:[id number] DOB: 11-22-1975  PCP: Grayce Sessions, NP  Admitted from: Home Discharged to: Home  Admit date: 04/30/2023 Discharge date: 05/02/2023  Recommendations for Outpatient Follow-up:    Follow-up Information     Grayce Sessions, NP. Schedule an appointment as soon as possible for a visit in 1 week(s).   Specialty: Internal Medicine Why: To be seen with repeat labs (CBC & BMP). Contact information: [address] [place] [address] [phone]         Kathi Der, MD. Schedule an appointment as soon as possible for a visit.   Specialty: Gastroenterology Contact information: [address] [place] [address] [phone]                  Home Health: None    Equipment/Devices: None    Discharge Condition: Improved and stable   Code Status: Full Code Diet recommendation:  Discharge Diet Orders (From admission, onward)     Start     Ordered   05/02/23 0000  Diet - low sodium heart healthy        05/02/23 1204             Discharge Diagnoses:  Principal Problem:   Acute kidney failure (HCC) Active Problems:   Hypokalemia   HTN (hypertension)   Intractable nausea and vomiting   Brief Summary: 47 year old male with PMH of HTN, gastritis, intractable nausea/vomiting ongoing chronically for a number of years per H&P, was supposed to have EGD and colonoscopy on 6/20 but could not keep down the colon prep, did undergo EGD 6/25 which showed esophagitis, esophageal ulcer and gastritis (as per discussion with Dr. Levora Angel on 6/26), called back by MD later that day and advised to go to ED due to acute renal failure.  Main ED hypotensive with initial BP of 86/65.  Creatinine >8.  Nephrology consulted.     Assessment & Plan:   Acute kidney injury: - Baseline creatinine 1-1 0.1 in February 2024. - Presented with creatinine of 8.1. - Multifactorial etiology: Hypotension  with possible ATN, prerenal from poor oral intake and intravascular volume depletion, meds including ARB and HCTZ. - CT without hydronephrosis. - In the ED got 3 L IV fluid bolus, IV albumin 25 g and placed on maintenance LR 100 mL/h. -Nephrology consulted on 6/26, rebolused with additional 2 L of IV fluids, continued maintenance IV fluids. -Creatinine has steadily improved, down to 1.49 and expect full recovery.  Recommend repeating BMP and follow-up with PCP in the next week.  Communicated with Dr. Arlean Hopping who has cleared patient for DC home, okay to start sucralfate for GI indication.   - Also per nephrology, while he still having ongoing GI issues i.e. chronic nausea and vomiting, told to avoid ACEI, ARB's and diuretics due to risk of recurrent AKI.  This was discussed in detail with patient and he verbalizes understanding.   Hypertension/hypotension: - Blood pressures improved but still on the soft side. Discontinue ACEI, ARB, diuretics indefinitely for now until GI issues completely resolved. - Also holding home amlodipine at time of discharge due to soft blood pressures.  To be reassessed during close outpatient follow-up with PCP and consider resuming amlodipine if needed.   Refractory nausea, vomiting, esophagitis/esophageal ulcer/gastritis: Per discussion with Dr. Levora Angel, [facility] 6/26: EGD 6/25 showed above findings.  Recommends twice daily PPI and Carafate Absolute cessation of THC has been counseled and patient reported that "I know that". Tolerating diet.  GI will arrange for colonoscopy in a month's time.  Patient reports that he has a colonoscopy scheduled for 7/16.   Hypokalemia: Replaced.  Magnesium normal.   Polysubstance abuse (tobacco and THC): Cessation counseled.  Patient declines nicotine patch.   Body mass index is 19.77 kg/m.     Consultants:   Nephrology.   Procedures:      Discharge Instructions  Discharge Instructions     Activity as tolerated - No  restrictions   Complete by: As directed    Call MD for:  difficulty breathing, headache or visual disturbances   Complete by: As directed    Call MD for:  extreme fatigue   Complete by: As directed    Call MD for:  persistant dizziness or light-headedness   Complete by: As directed    Call MD for:  persistant nausea and vomiting   Complete by: As directed    Call MD for:  severe uncontrolled pain   Complete by: As directed    Call MD for:  temperature >100.4   Complete by: As directed    Diet - low sodium heart healthy   Complete by: As directed    Discharge instructions   Complete by: As directed    Avoid medications belonging to the following class: 1) NSAIDs: Such as ibuprofen/Motrin, Naprosyn, Goody's powder, BC powder, aspirin etc.  May take acetaminophen/Tylenol for pain. 2) ACE inhibitors: Medications that end with the "pril", such as lisinopril, enalapril etc. 3) ARB's: Such as losartan, telmisartan etc., medications ending with "artan" 4) diuretics: Such as furosemide/Lasix, HCTZ etc.        Medication List     STOP taking these medications    amLODipine 10 MG tablet Commonly known as: NORVASC   potassium chloride SA 20 MEQ tablet Commonly known as: KLOR-CON M   valsartan-hydrochlorothiazide 160-25 MG tablet Commonly known as: DIOVAN-HCT       TAKE these medications    allopurinol 100 MG tablet Commonly known as: ZYLOPRIM Take 1 tablet (100 mg total) by mouth daily.   cyclobenzaprine 10 MG tablet Commonly known as: FLEXERIL Take 1 tablet (10 mg total) by mouth 2 (two) times daily as needed for muscle spasms.   ondansetron 4 MG disintegrating tablet Commonly known as: ZOFRAN-ODT Take 1 tablet (4 mg total) by mouth every 4 (four) hours as needed for nausea or vomiting.   pantoprazole 40 MG tablet Commonly known as: Protonix Take 1 tablet (40 mg total) by mouth 2 (two) times daily before a meal.   promethazine 25 MG tablet Commonly known as:  PHENERGAN Take 1 tablet (25 mg total) by mouth every 6 (six) hours as needed for nausea or vomiting.   sucralfate 1 g tablet Commonly known as: CARAFATE Mix 1 tablet in 10 ml of H2O and drink 4 (four) times daily -  with meals and at bedtime for 14 days.       No Known Allergies    Procedures/Studies: CT ABDOMEN PELVIS WO CONTRAST  Result Date: 05/01/2023 CLINICAL DATA:  Provided history: Kidney failure, acute renal failure, concern for obstruciton Endoscopy earlier today. EXAM: CT ABDOMEN AND PELVIS WITHOUT CONTRAST TECHNIQUE: Multidetector CT imaging of the abdomen and pelvis was performed following the standard protocol without IV contrast. RADIATION DOSE REDUCTION: This exam was performed according to the departmental dose-optimization program which includes automated exposure control, adjustment of the mA and/or kV according to patient size and/or use of iterative reconstruction technique. COMPARISON:  CT 01/29/2023 FINDINGS: Lower chest:  Minor dependent atelectasis in the lung bases. Mild wall thickening of the distal esophagus. Hepatobiliary: Calcified granuloma in the right lobe of the liver. No evidence of focal liver abnormality on this unenhanced exam. Gallbladder physiologically distended, no calcified stone. No biliary dilatation. Pancreas: No ductal dilatation or inflammation. Spleen: Normal in size without focal abnormality. Adrenals/Urinary Tract: No adrenal nodule. No hydronephrosis. No renal calculi. No perinephric edema. Both ureters are decompressed. Partially distended urinary bladder, normal for degree of distension. Stomach/Bowel: Mild wall thickening of the distal esophagus. No lower pneumomediastinum. Nondistended stomach. No evidence of bowel obstruction or inflammation. Normal appendix is potentially visualized. No appendicitis. Small volume of stool in the colon. Distal colonic diverticulosis without focal diverticulitis. Vascular/Lymphatic: Normal caliber abdominal  aorta, minimal atherosclerosis. No abdominopelvic adenopathy. Reproductive: Prostate is unremarkable. Other: No free air. No ascites or free fluid. No significant abdominal wall hernia. Musculoskeletal: Mild chronic change of both hips. There are no acute or suspicious osseous abnormalities. IMPRESSION: 1. No renal stones or obstructive uropathy. 2. Mild wall thickening of the distal esophagus, recommend correlation with recent endoscopy. 3. Distal colonic diverticulosis without focal diverticulitis. Aortic Atherosclerosis (ICD10-I70.0). Electronically Signed   By: Narda Rutherford M.D.   On: 05/01/2023 01:15      Subjective: Reports that he is doing well.  Denies complaints.  Tolerating diet.  Having good urine output.  No pain, nausea or vomiting or abdominal pain reported.  Discharge Exam:  Vitals:   05/01/23 1213 05/01/23 1549 05/01/23 2310 05/02/23 0559  BP: (!) 102/59 115/77 95/63 109/76  Pulse: 66 89 86 72  Resp: 16 16  17   Temp: 98 F (36.7 C) 98.4 F (36.9 C) 99 F (37.2 C) 98.4 F (36.9 C)  TempSrc: Oral Oral Oral Oral  SpO2: 100% 100% 100% 100%  Weight:      Height:        General exam: Young male, moderately built and nourished lying comfortably supine in bed without distress.  Oral mucosa moist. Respiratory system: Clear to auscultation. Respiratory effort normal. Cardiovascular system: S1 & S2 heard, RRR. No JVD, murmurs, rubs, gallops or clicks. No pedal edema.  Telemetry personally reviewed: Sinus rhythm. Gastrointestinal system: Abdomen is nondistended, soft and nontender. No organomegaly or masses felt. Normal bowel sounds heard. Central nervous system: Alert and oriented. No focal neurological deficits. Extremities: Symmetric 5 x 5 power. Skin: No rashes, lesions or ulcers Psychiatry: Judgement and insight appear normal. Mood & affect appropriate.    The results of significant diagnostics from this hospitalization (including imaging, microbiology, ancillary and  laboratory) are listed below for reference.     Microbiology: No results found for this or any previous visit (from the past 240 hour(s)).   Labs: CBC: Recent Labs  Lab 04/25/23 1545 04/30/23 2146  WBC 11.1* 8.1  HGB 14.6 13.6  HCT 42.2 41.3  MCV 92.5 97.6  PLT 442* 393    Basic Metabolic Panel: Recent Labs  Lab 04/25/23 1545 04/30/23 2240 05/01/23 0430 05/01/23 1539 05/02/23 0406  NA 133* 132* 135 136 138  K 2.9* 3.2* 3.4* 3.9 4.1  CL 99 98 101 101 106  CO2 23 21* 24 27 25   GLUCOSE 136* 94 112* 88 83  BUN 25* 54* 51* 43* 34*  CREATININE 1.78* 8.18* 6.01* 2.90* 1.49*  CALCIUM 9.0 8.0* 8.5* 8.6* 8.4*  MG  --   --  1.9  --   --     Liver Function Tests: Recent Labs  Lab 04/25/23 1545 04/30/23  2240  AST 17 12*  ALT 16 13  ALKPHOS 64 50  BILITOT 1.5* 0.6  PROT 8.4* 6.5  ALBUMIN 4.7 3.8    CBG: Recent Labs  Lab 04/30/23 2150  GLUCAP 107*     Urinalysis    Component Value Date/Time   COLORURINE YELLOW 04/30/2023 2300   APPEARANCEUR CLEAR 04/30/2023 2300   LABSPEC 1.015 04/30/2023 2300   PHURINE 5.0 04/30/2023 2300   GLUCOSEU NEGATIVE 04/30/2023 2300   HGBUR SMALL (A) 04/30/2023 2300   BILIRUBINUR NEGATIVE 04/30/2023 2300   KETONESUR NEGATIVE 04/30/2023 2300   PROTEINUR 30 (A) 04/30/2023 2300   NITRITE NEGATIVE 04/30/2023 2300   LEUKOCYTESUR NEGATIVE 04/30/2023 2300      Time coordinating discharge: 25 minutes  SIGNED:  Marcellus Scott, MD,  FACP, FHM, [facility], [facility], [facility]   [facility] Ad[facility] [place]     To contact the attending provider between 7A-7P or the covering provider during after hours 7P-7A, please log into the web site [email or web address] and access using universal [place] password for that web site. If you do not have the password, please call the hospital operator.

## 2023-05-02 NOTE — Discharge Instructions (Addendum)

## 2023-05-02 NOTE — Plan of Care (Signed)
  Problem: Education: Goal: Knowledge of General Education information will improve Description: Including pain rating scale, medication(s)/side effects and non-pharmacologic comfort measures Outcome: Progressing   Problem: Clinical Measurements: Goal: Ability to maintain clinical measurements within normal limits will improve Outcome: Progressing Goal: Will remain free from infection Outcome: Progressing Goal: Diagnostic test results will improve Outcome: Progressing Goal: Respiratory complications will improve Outcome: Progressing Goal: Cardiovascular complication will be avoided Outcome: Progressing   Problem: Activity: Goal: Risk for activity intolerance will decrease Outcome: Progressing   Problem: Nutrition: Goal: Adequate nutrition will be maintained Outcome: Progressing   Problem: Coping: Goal: Level of anxiety will decrease Outcome: Progressing   Problem: Elimination: Goal: Will not experience complications related to bowel motility Outcome: Progressing   

## 2023-05-02 NOTE — Progress Notes (Signed)
Transition of Care [facility]) - Inpatient Brief Assessment   Patient Details  Name: Victor Johnson MRN: [id number] Date of Birth: 08/14/1976  Transition of Care [facility]) CM/SW Contact:    Larrie Kass, LCSW Phone Number: 05/02/2023, 12:20 PM   Clinical Narrative:  Substance resources added to pt's aVS. TOC sign off.   Transition of Care Asessment: Insurance and Status: Insurance coverage has been reviewed Patient has primary care physician: Yes Home environment has been reviewed: yes   Prior/Current Home Services: No current home services Social Determinants of Health Reivew: SDOH reviewed no interventions necessary Readmission risk has been reviewed: Yes Transition of care needs: no transition of care needs at this time

## 2023-05-06 ENCOUNTER — Telehealth: Payer: Self-pay

## 2023-05-06 NOTE — Transitions of Care (Post Inpatient/ED Visit) (Signed)
   05/06/2023  Name: Victor Johnson MRN: [id number] DOB: [birth year]  Today's TOC FU Call Status: Today's TOC FU Call Status:: Unsuccessul Call (1st Attempt) Unsuccessful Call (1st Attempt) Date: 05/06/23  Attempted to reach the patient regarding the most recent Inpatient/ED visit.  Follow Up Plan: Additional outreach attempts will be made to reach the patient to complete the Transitions of Care (Post Inpatient/ED visit) call.   Signature Robyne Peers, RN

## 2023-05-07 ENCOUNTER — Telehealth: Payer: Self-pay

## 2023-05-07 NOTE — Transitions of Care (Post Inpatient/ED Visit) (Signed)
   05/07/2023  Name: Victor Johnson MRN: [id number] DOB: [birth year]  Today's TOC FU Call Status: Today's TOC FU Call Status:: Successful TOC FU Call Competed Unsuccessful Call (1st Attempt) Date: 05/06/23 [facility] FU Call Complete Date: 05/07/23  Transition Care Management Follow-up Telephone Call Date of Discharge: 05/02/23 Discharge Facility: [facility]) Type of Discharge: Inpatient Admission Primary Inpatient Discharge Diagnosis:: Acute kidney failure How have you been since you were released from the hospital?: Better Any questions or concerns?: No  Items Reviewed: Did you receive and understand the discharge instructions provided?: Yes Medications obtained,verified, and reconciled?: No Medications Not Reviewed Reasons:: Other: (He said he has all of his medications and did not have any questions about the med regime and did not need to review the med list) Any new allergies since your discharge?: No Dietary orders reviewed?: Yes Type of Diet Ordered:: heart healthy Do you have support at home?: Yes Name of Support/Comfort Primary Source: He just stated he has help at home  Medications Reviewed Today: Medications Reviewed Today     Reviewed by Lenor Derrick, CPhT (Pharmacy Technician) on 04/30/23 at 2212  Med List Status: Complete   Medication Order Taking? Sig Documenting Provider Last Dose Status Informant  allopurinol (ZYLOPRIM) 100 MG tablet [id number] Yes Take 1 tablet (100 mg total) by mouth daily. Grayce Sessions, NP 04/29/2023 Active Self  amLODipine (NORVASC) 10 MG tablet [id number] Yes Take 1 tablet (10 mg total) by mouth daily. Grayce Sessions, NP 04/29/2023 Active Self  cyclobenzaprine (FLEXERIL) 10 MG tablet [id number] Yes Take 1 tablet (10 mg total) by mouth 2 (two) times daily as needed for muscle spasms. Grayce Sessions, NP 04/27/2023 Active Self  ondansetron (ZOFRAN-ODT) 4 MG disintegrating tablet [id number] Yes Take 1 tablet (4 mg total) by mouth every 4  (four) hours as needed for nausea or vomiting. Arby Barrette, MD 04/29/2023 Active Self  potassium chloride SA (KLOR-CON M) 20 MEQ tablet [id number] Yes Take 1 tablet (20 mEq total) by mouth daily. Fayrene Helper, PA-C 04/30/2023 Active Self  promethazine (PHENERGAN) 25 MG tablet [id number] Yes Take 1 tablet (25 mg total) by mouth every 6 (six) hours as needed for nausea or vomiting. Fayrene Helper, PA-C 04/27/23 Active Self  valsartan-hydrochlorothiazide (DIOVAN-HCT) 160-25 MG tablet [id number] Yes Take 1 tablet by mouth daily. Grayce Sessions, NP 04/29/2023 Active Self            Home Care and Equipment/Supplies: Were Home Health Services Ordered?: No Any new equipment or medical supplies ordered?: No  Functional Questionnaire: Do you need assistance with bathing/showering or dressing?: No Do you need assistance with meal preparation?: No Do you need assistance with eating?: No Do you have difficulty maintaining continence: No Do you need assistance with getting out of bed/getting out of a chair/moving?: No Do you have difficulty managing or taking your medications?: No  Follow up appointments reviewed: PCP Follow-up appointment confirmed?: Yes Date of PCP follow-up appointment?: 05/13/23 Follow-up Provider: [name], NP Specialist Hospital Follow-up appointment confirmed?: No Reason Specialist Follow-Up Not Confirmed: Patient has Specialist Provider Number and will Call for Appointment (he needs to call GI for appointment.  Number on AVS) Do you need transportation to your follow-up appointment?: No Do you understand care options if your condition(s) worsen?: Yes-patient verbalized understanding    SIGNATURE Robyne Peers, RN

## 2023-05-13 ENCOUNTER — Telehealth (INDEPENDENT_AMBULATORY_CARE_PROVIDER_SITE_OTHER): Payer: Medicaid Other | Admitting: Primary Care

## 2023-05-16 DIAGNOSIS — R899 Unspecified abnormal finding in specimens from other organs, systems and tissues: Secondary | ICD-10-CM | POA: Diagnosis not present

## 2023-05-17 ENCOUNTER — Encounter (HOSPITAL_COMMUNITY)
Admission: RE | Admit: 2023-05-17 | Discharge: 2023-05-17 | Disposition: A | Discharge status: Home / Self Care | Payer: Medicaid Other | Source: Ambulatory Visit | Attending: Gastroenterology | Admitting: Gastroenterology

## 2023-05-17 DIAGNOSIS — R112 Nausea with vomiting, unspecified: Secondary | ICD-10-CM | POA: Insufficient documentation

## 2023-05-17 DIAGNOSIS — R1011 Right upper quadrant pain: Secondary | ICD-10-CM | POA: Diagnosis not present

## 2023-05-17 MED ORDER — TECHNETIUM TC 99M MEBROFENIN IV KIT
5.0300 | PACK | Freq: Once | INTRAVENOUS | Status: AC | PRN
  Administered 2023-05-17: 5.03 via INTRAVENOUS

## 2023-05-21 ENCOUNTER — Telehealth (INDEPENDENT_AMBULATORY_CARE_PROVIDER_SITE_OTHER): Payer: Self-pay | Admitting: Primary Care

## 2023-05-21 DIAGNOSIS — K648 Other hemorrhoids: Secondary | ICD-10-CM | POA: Diagnosis not present

## 2023-05-21 DIAGNOSIS — K5730 Diverticulosis of large intestine without perforation or abscess without bleeding: Secondary | ICD-10-CM | POA: Diagnosis not present

## 2023-05-21 DIAGNOSIS — D123 Benign neoplasm of transverse colon: Secondary | ICD-10-CM | POA: Diagnosis not present

## 2023-05-21 DIAGNOSIS — Z1211 Encounter for screening for malignant neoplasm of colon: Secondary | ICD-10-CM | POA: Diagnosis not present

## 2023-05-21 DIAGNOSIS — D122 Benign neoplasm of ascending colon: Secondary | ICD-10-CM | POA: Diagnosis not present

## 2023-05-21 NOTE — Telephone Encounter (Signed)
Left VM with pt.

## 2023-05-22 ENCOUNTER — Encounter (INDEPENDENT_AMBULATORY_CARE_PROVIDER_SITE_OTHER): Payer: Self-pay | Admitting: Primary Care

## 2023-05-22 ENCOUNTER — Ambulatory Visit (INDEPENDENT_AMBULATORY_CARE_PROVIDER_SITE_OTHER): Payer: Medicaid Other | Admitting: Primary Care

## 2023-05-22 ENCOUNTER — Ambulatory Visit (INDEPENDENT_AMBULATORY_CARE_PROVIDER_SITE_OTHER): Payer: Self-pay

## 2023-05-22 VITALS — BP 124/83 | HR 92 | Resp 16 | Wt 143.6 lb

## 2023-05-22 DIAGNOSIS — Z0000 Encounter for general adult medical examination without abnormal findings: Secondary | ICD-10-CM | POA: Diagnosis not present

## 2023-05-22 DIAGNOSIS — M1A9XX0 Chronic gout, unspecified, without tophus (tophi): Secondary | ICD-10-CM

## 2023-05-22 DIAGNOSIS — I10 Essential (primary) hypertension: Secondary | ICD-10-CM | POA: Diagnosis not present

## 2023-05-22 MED ORDER — COLCHICINE 0.6 MG PO TABS
0.6000 mg | ORAL_TABLET | Freq: Two times a day (BID) | ORAL | 0 refills | Status: DC
Start: 2023-05-22 — End: 2023-12-07

## 2023-05-22 NOTE — Progress Notes (Signed)
   Established Patient Office Visit  Subjective   Patient ID: Victor Johnson    DOB: 1976-10-28  Age: 47 y.o. MRN: [id number]  HPI  [name] is a 47 year old male who present with better news kidney function is improving but had a colonoscopy found polyp and awaiting results. His Bp is normal not on any medication. Patient has No headache, No chest pain, No abdominal pain - No Nausea, No new weakness tingling or numbness, No Cough - shortness of breath   Active Ambulatory Problems    Diagnosis Date Noted   Acute kidney failure (HCC) 07/08/2022   Nausea and vomiting 07/08/2022   Abdominal pain 07/08/2022   Hypokalemia 07/08/2022   HTN (hypertension) 07/08/2022   Intractable nausea and vomiting 07/09/2022   Resolved Ambulatory Problems    Diagnosis Date Noted   No Resolved Ambulatory Problems   Past Medical History:  Diagnosis Date   Gastritis    Hypertension      ROS  Comprehensive ROS Pertinent positive and negative noted in HPI     Objective:      Physical Exam Vitals reviewed.  Constitutional:      Appearance: Normal appearance.  HENT:     Head: Normocephalic.     Right Ear: Tympanic membrane and external ear normal.     Left Ear: Tympanic membrane and external ear normal.     Nose: Nose normal.  Eyes:     Extraocular Movements: Extraocular movements intact.     Pupils: Pupils are equal, round, and reactive to light.  Cardiovascular:     Rate and Rhythm: Normal rate and regular rhythm.  Pulmonary:     Effort: Pulmonary effort is normal.     Breath sounds: Normal breath sounds.  Abdominal:     General: Bowel sounds are normal.     Palpations: Abdomen is soft.  Musculoskeletal:        General: Normal range of motion.     Cervical back: Normal range of motion.  Skin:    General: Skin is warm and dry.  Neurological:     Mental Status: He is alert and oriented to person, place, and time.  Psychiatric:        Mood and Affect: Mood normal.         Behavior: Behavior normal.      No results found for any visits on 09/13/22.The ASCVD Risk score (Arnett DK, et al., 2019) failed to calculate for the following reasons:   The systolic blood pressure is missing   Cannot find a previous HDL lab   Cannot find a previous total cholesterol lab    Assessment & Plan:  . Victor Johnson was seen today for hospitalization follow-up.  Diagnoses and all orders for this visit:  Essential hypertension Variable took picture of reading 140/90 review of Bp been controlled   Healthcare maintenance Informational on how he was doing and feeling  Chronic gout involving toe of right foot without tophus, unspecified cause -     colchicine 0.6 MG tablet; Take 1 tablet (0.6 mg total) by mouth 2 (two) times daily.     Grayce Sessions, NP

## 2023-05-22 NOTE — Telephone Encounter (Signed)
Chief Complaint: drug interaction between colchicine and fluconazole Additional Notes: Melaine, RPH at [facility] says that the patient was picked up on 05/20/23 fluconazole to take x 10 days and amoxicillin and biaxin to take x 14 days all by another provider. She says there is a drug interaction between fluconazole and colchicine-can cause colchicine toxicity. She is asking does the provider want the colchicine filled, Rx received today. Advised I will send this to the provider and someone will call on tomorrow with her recommendation. Melanie verbalized understanding.   Summary: med ?   Melanie from [facility] called in asking if its ok to fill the med, colchicine 0.6 MG tablet because there is a drug interaction with his other med, called, fluconazole         Reason for Disposition  [1] Caller has NON-URGENT medicine question about med that PCP prescribed AND [2] triager unable to answer question  Answer Assessment - Initial Assessment Questions 1. NAME of MEDICINE: "What medicine(s) are you calling about?"     Colchicine 2. QUESTION: "What is your question?" (e.g., double dose of medicine, side effect)     Is it ok refill with fluconazole 3. PRESCRIBER: "Who prescribed the medicine?" Reason: if prescribed by specialist, call should be referred to that group.     [name]  Protocols used: Medication Question Call-A-AH

## 2023-05-23 MED ORDER — METHYLPREDNISOLONE 4 MG PO TBPK
ORAL_TABLET | ORAL | 0 refills | Status: DC
Start: 2023-05-23 — End: 2023-12-07

## 2023-05-23 NOTE — Telephone Encounter (Addendum)
Per pharmacy- Allopurinol is contraindicated with Sulcrafate. He would not be able to take together. The allopurinol  will be 6.90 out of pocket.   Same for the colchicine however, the fluconazole is for 10 days.  Patient and PCP will be informed of this message and alternatives.    Sending to PCP to advise

## 2023-05-27 NOTE — Telephone Encounter (Signed)
He can hold off on the Colchicine until course of fluconazole is completed.  Then restart it.  Allopurinol can also be held until he has completed course of sacral fate.  He will need to avoid foods that trigger gout like red meat, seafood, alcohol to prevent having a gout flare.

## 2023-05-27 NOTE — Telephone Encounter (Signed)
Returned pt call and went over provider response pt doesn't have any questions or concerns  

## 2023-05-27 NOTE — Telephone Encounter (Signed)
Will forward to covering provider.

## 2023-08-13 DIAGNOSIS — K3189 Other diseases of stomach and duodenum: Secondary | ICD-10-CM | POA: Diagnosis not present

## 2023-08-13 DIAGNOSIS — K229 Disease of esophagus, unspecified: Secondary | ICD-10-CM | POA: Diagnosis not present

## 2023-08-13 DIAGNOSIS — B9681 Helicobacter pylori [H. pylori] as the cause of diseases classified elsewhere: Secondary | ICD-10-CM | POA: Diagnosis not present

## 2023-08-13 DIAGNOSIS — K2210 Ulcer of esophagus without bleeding: Secondary | ICD-10-CM | POA: Diagnosis not present

## 2023-08-13 DIAGNOSIS — K2100 Gastro-esophageal reflux disease with esophagitis, without bleeding: Secondary | ICD-10-CM | POA: Diagnosis not present

## 2023-08-13 DIAGNOSIS — K449 Diaphragmatic hernia without obstruction or gangrene: Secondary | ICD-10-CM | POA: Diagnosis not present

## 2023-08-13 DIAGNOSIS — K2930 Chronic superficial gastritis without bleeding: Secondary | ICD-10-CM | POA: Diagnosis not present

## 2023-08-28 ENCOUNTER — Encounter (INDEPENDENT_AMBULATORY_CARE_PROVIDER_SITE_OTHER): Payer: Self-pay

## 2023-08-28 ENCOUNTER — Ambulatory Visit (INDEPENDENT_AMBULATORY_CARE_PROVIDER_SITE_OTHER): Payer: Medicaid Other | Admitting: Primary Care

## 2023-12-06 ENCOUNTER — Emergency Department
Admission: EM | Admit: 2023-12-06 | Discharge: 2023-12-07 | Disposition: A | Discharge status: Home / Self Care | Payer: Medicaid Other

## 2023-12-07 ENCOUNTER — Ambulatory Visit (HOSPITAL_COMMUNITY)
Admission: EM | Admit: 2023-12-07 | Discharge: 2023-12-07 | Disposition: A | Discharge status: Home / Self Care | Payer: Medicaid Other | Attending: Neurology | Admitting: Neurology

## 2023-12-07 ENCOUNTER — Encounter (HOSPITAL_COMMUNITY): Payer: Self-pay

## 2023-12-07 DIAGNOSIS — M25531 Pain in right wrist: Secondary | ICD-10-CM | POA: Diagnosis not present

## 2023-12-07 DIAGNOSIS — M62838 Other muscle spasm: Secondary | ICD-10-CM

## 2023-12-07 HISTORY — DX: Polyp of colon: K63.5

## 2023-12-07 MED ORDER — ACETAMINOPHEN 500 MG PO TABS: 500.0000 mg | ORAL_TABLET | Freq: Four times a day (QID) | ORAL | 0 refills | Status: AC | PRN

## 2023-12-07 MED ORDER — KETOROLAC TROMETHAMINE 30 MG/ML IJ SOLN
INTRAMUSCULAR | Status: AC
  Filled 2023-12-07: qty 1

## 2023-12-07 MED ORDER — KETOROLAC TROMETHAMINE 30 MG/ML IJ SOLN
30.0000 mg | Freq: Once | INTRAMUSCULAR | Status: AC
  Administered 2023-12-07: 30 mg via INTRAMUSCULAR
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
## 2023-12-12 ENCOUNTER — Telehealth (INDEPENDENT_AMBULATORY_CARE_PROVIDER_SITE_OTHER): Payer: Self-pay | Admitting: Primary Care

## 2023-12-12 NOTE — Telephone Encounter (Signed)
 Called pt to remind them about atp. Pt will be present

## 2023-12-13 ENCOUNTER — Ambulatory Visit (INDEPENDENT_AMBULATORY_CARE_PROVIDER_SITE_OTHER): Payer: Medicaid Other | Admitting: Primary Care

## 2023-12-13 ENCOUNTER — Encounter (INDEPENDENT_AMBULATORY_CARE_PROVIDER_SITE_OTHER): Payer: Self-pay | Admitting: Primary Care

## 2023-12-13 VITALS — BP 123/84 | HR 103 | Resp 16 | Ht 68.0 in | Wt 142.2 lb

## 2023-12-13 DIAGNOSIS — M542 Cervicalgia: Secondary | ICD-10-CM | POA: Diagnosis not present

## 2023-12-13 DIAGNOSIS — Z2821 Immunization not carried out because of patient refusal: Secondary | ICD-10-CM | POA: Diagnosis not present

## 2023-12-13 DIAGNOSIS — L249 Irritant contact dermatitis, unspecified cause: Secondary | ICD-10-CM

## 2023-12-13 DIAGNOSIS — M545 Low back pain, unspecified: Secondary | ICD-10-CM
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
## 2023-12-17 DIAGNOSIS — H5213 Myopia, bilateral: Secondary | ICD-10-CM | POA: Diagnosis not present

## 2023-12-20 ENCOUNTER — Other Ambulatory Visit (INDEPENDENT_AMBULATORY_CARE_PROVIDER_SITE_OTHER): Payer: Medicaid Other

## 2023-12-20 ENCOUNTER — Encounter: Payer: Self-pay | Admitting: Physical Medicine and Rehabilitation

## 2023-12-20 ENCOUNTER — Ambulatory Visit: Payer: Medicaid Other | Admitting: Physical Medicine and Rehabilitation

## 2023-12-20 VITALS — BP 154/107 | HR 92

## 2023-12-20 DIAGNOSIS — S161XXA Strain of muscle, fascia and tendon at neck level, initial encounter: Secondary | ICD-10-CM

## 2023-12-20 DIAGNOSIS — M542 Cervicalgia: Secondary | ICD-10-CM

## 2023-12-20 NOTE — Progress Notes (Signed)
Pain Score-5 Patient advises he is no longer taking his blood pressure medication due to GI upset.
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
## 2023-12-26 ENCOUNTER — Ambulatory Visit: Payer: Medicaid Other | Admitting: Physical Therapy

## 2023-12-30 ENCOUNTER — Other Ambulatory Visit: Payer: Self-pay

## 2023-12-30 ENCOUNTER — Emergency Department (HOSPITAL_COMMUNITY): Payer: Medicaid Other

## 2023-12-30 ENCOUNTER — Encounter (HOSPITAL_COMMUNITY): Payer: Self-pay

## 2023-12-30 ENCOUNTER — Inpatient Hospital Stay (HOSPITAL_COMMUNITY)
Admission: EM | Admit: 2023-12-30 | Discharge: 2024-01-01 | DRG: 683 | Disposition: A | Discharge status: Home / Self Care | Payer: Medicaid Other | Attending: Internal Medicine | Admitting: Internal Medicine

## 2023-12-30 DIAGNOSIS — R748 Abnormal levels of other serum enzymes: Secondary | ICD-10-CM | POA: Diagnosis present

## 2023-12-30 DIAGNOSIS — I10 Essential (primary) hypertension: Secondary | ICD-10-CM | POA: Diagnosis present

## 2023-12-30 DIAGNOSIS — R000 Tachycardia, unspecified: Secondary | ICD-10-CM | POA: Diagnosis not present

## 2023-12-30 DIAGNOSIS — E872 Acidosis, unspecified: Secondary | ICD-10-CM | POA: Diagnosis present

## 2023-12-30 DIAGNOSIS — R739 Hyperglycemia, unspecified: Secondary | ICD-10-CM | POA: Diagnosis present

## 2023-12-30 DIAGNOSIS — Z79899 Other long term (current) drug therapy: Secondary | ICD-10-CM

## 2023-12-30 DIAGNOSIS — F1210 Cannabis abuse, uncomplicated: Secondary | ICD-10-CM | POA: Diagnosis present

## 2023-12-30 DIAGNOSIS — D72829 Elevated white blood cell count, unspecified: Secondary | ICD-10-CM | POA: Diagnosis present

## 2023-12-30 DIAGNOSIS — R1110 Vomiting, unspecified: Principal | ICD-10-CM

## 2023-12-30 DIAGNOSIS — D751 Secondary polycythemia: Secondary | ICD-10-CM | POA: Diagnosis present

## 2023-12-30 DIAGNOSIS — F1010 Alcohol abuse, uncomplicated: Secondary | ICD-10-CM | POA: Diagnosis present

## 2023-12-30 DIAGNOSIS — E869 Volume depletion, unspecified: Secondary | ICD-10-CM | POA: Diagnosis present

## 2023-12-30 DIAGNOSIS — R109 Unspecified abdominal pain: Secondary | ICD-10-CM | POA: Diagnosis present

## 2023-12-30 DIAGNOSIS — E876 Hypokalemia: Secondary | ICD-10-CM | POA: Diagnosis not present

## 2023-12-30 DIAGNOSIS — M109 Gout, unspecified: Secondary | ICD-10-CM | POA: Diagnosis present

## 2023-12-30 DIAGNOSIS — D75838 Other thrombocytosis: Secondary | ICD-10-CM | POA: Diagnosis present

## 2023-12-30 DIAGNOSIS — F1290 Cannabis use, unspecified, uncomplicated: Secondary | ICD-10-CM | POA: Diagnosis present

## 2023-12-30 DIAGNOSIS — M62838 Other muscle spasm: Secondary | ICD-10-CM | POA: Diagnosis present

## 2023-12-30 DIAGNOSIS — E8809 Other disorders of plasma-protein metabolism, not elsewhere classified: Secondary | ICD-10-CM | POA: Diagnosis present

## 2023-12-30 DIAGNOSIS — N179 Acute kidney failure, unspecified: Principal | ICD-10-CM | POA: Diagnosis present

## 2023-12-30 DIAGNOSIS — R112 Nausea with vomiting, unspecified: Secondary | ICD-10-CM | POA: Diagnosis present

## 2023-12-30 DIAGNOSIS — F17210 Nicotine dependence, cigarettes, uncomplicated: Secondary | ICD-10-CM | POA: Diagnosis present

## 2023-12-30 DIAGNOSIS — K5732 Diverticulitis of large intestine without perforation or abscess without bleeding: Secondary | ICD-10-CM | POA: Diagnosis present

## 2023-12-30 DIAGNOSIS — D75839 Thrombocytosis, unspecified: Secondary | ICD-10-CM | POA: Diagnosis present

## 2023-12-30 DIAGNOSIS — K5792 Diverticulitis of intestine, part unspecified, without perforation or abscess without bleeding: Secondary | ICD-10-CM | POA: Diagnosis present

## 2023-12-30 DIAGNOSIS — R9431 Abnormal electrocardiogram [ECG] [EKG]: Secondary | ICD-10-CM | POA: Diagnosis present

## 2023-12-30 DIAGNOSIS — F17200 Nicotine dependence, unspecified, uncomplicated: Secondary | ICD-10-CM | POA: Diagnosis present

## 2023-12-30 LAB — COMPREHENSIVE METABOLIC PANEL
ALT: 17 U/L (ref 0–44)
AST: 17 U/L (ref 15–41)
Albumin: 5 g/dL (ref 3.5–5.0)
Alkaline Phosphatase: 65 U/L (ref 38–126)
Anion gap: 18 — ABNORMAL HIGH (ref 5–15)
BUN: 26 mg/dL — ABNORMAL HIGH (ref 6–20)
CO2: 22 mmol/L (ref 22–32)
Calcium: 10 mg/dL (ref 8.9–10.3)
Chloride: 93 mmol/L — ABNORMAL LOW (ref 98–111)
Creatinine, Ser: 4.53 mg/dL — ABNORMAL HIGH (ref 0.61–1.24)
GFR, Estimated: 15 mL/min — ABNORMAL LOW (ref >60)
Glucose, Bld: 167 mg/dL — ABNORMAL HIGH (ref 70–99)
Potassium: 3.2 mmol/L — ABNORMAL LOW (ref 3.5–5.1)
Sodium: 133 mmol/L — ABNORMAL LOW (ref 135–145)
Total Bilirubin: 1.2 mg/dL (ref 0.0–1.2)
Total Protein: 9.2 g/dL — ABNORMAL HIGH (ref 6.5–8.1)

## 2023-12-30 LAB — CBC
HCT: 51.1 % (ref 39.0–52.0)
Hemoglobin: 17.8 g/dL — ABNORMAL HIGH (ref 13.0–17.0)
MCH: 32.1 pg (ref 26.0–34.0)
MCHC: 34.8 g/dL (ref 30.0–36.0)
MCV: 92.1 fL (ref 80.0–100.0)
Platelets: 453 10*3/uL — ABNORMAL HIGH (ref 150–400)
RBC: 5.55 MIL/uL (ref 4.22–5.81)
RDW: 13 % (ref 11.5–15.5)
WBC: 21.5 10*3/uL — ABNORMAL HIGH (ref 4.0–10.5)
nRBC: 0 % (ref 0.0–0.2)

## 2023-12-30 LAB — LIPASE, BLOOD: Lipase: 60 U/L — ABNORMAL HIGH (ref 11–51)

## 2023-12-30 LAB — URINALYSIS, ROUTINE W REFLEX MICROSCOPIC
Bacteria, UA: NONE SEEN
Glucose, UA: NEGATIVE mg/dL
Hgb urine dipstick: NEGATIVE
Ketones, ur: NEGATIVE mg/dL
Nitrite: NEGATIVE
Protein, ur: 100 mg/dL — AB
Specific Gravity, Urine: 1.027 (ref 1.005–1.030)
pH: 5 (ref 5.0–8.0)

## 2023-12-30 LAB — PHOSPHORUS: Phosphorus: 2.6 mg/dL (ref 2.5–4.6)

## 2023-12-30 LAB — MAGNESIUM
Magnesium: 2.3 mg/dL (ref 1.7–2.4)
Magnesium: 1.9 mg/dL (ref 1.7–2.4)

## 2023-12-30 MED ORDER — PANTOPRAZOLE SODIUM 40 MG IV SOLR
40.0000 mg | Freq: Once | INTRAVENOUS | Status: AC
  Administered 2023-12-30: 40 mg via INTRAVENOUS
  Filled 2023-12-30: qty 10

## 2023-12-30 MED ORDER — ONDANSETRON HCL 4 MG PO TABS: 4.0000 mg | ORAL_TABLET | Freq: Four times a day (QID) | ORAL | Status: DC | PRN

## 2023-12-30 MED ORDER — METRONIDAZOLE 500 MG/100ML IV SOLN
500.0000 mg | Freq: Two times a day (BID) | INTRAVENOUS | Status: DC
  Administered 2023-12-30 – 2024-01-01 (×4): 500 mg via INTRAVENOUS
  Filled 2023-12-30 (×4): qty 100

## 2023-12-30 MED ORDER — ACETAMINOPHEN 650 MG RE SUPP: 650.0000 mg | Freq: Four times a day (QID) | RECTAL | Status: DC | PRN

## 2023-12-30 MED ORDER — METHOCARBAMOL 500 MG PO TABS
500.0000 mg | ORAL_TABLET | Freq: Three times a day (TID) | ORAL | Status: DC | PRN
  Administered 2023-12-30 – 2023-12-31 (×2): 500 mg via ORAL
  Filled 2023-12-30 (×2): qty 1

## 2023-12-30 MED ORDER — LACTATED RINGERS IV BOLUS
1000.0000 mL | Freq: Once | INTRAVENOUS | Status: AC
  Administered 2023-12-30: 1000 mL via INTRAVENOUS

## 2023-12-30 MED ORDER — ACETAMINOPHEN 325 MG PO TABS
650.0000 mg | ORAL_TABLET | Freq: Four times a day (QID) | ORAL | Status: DC | PRN
  Administered 2023-12-31 – 2024-01-01 (×2): 650 mg via ORAL
  Filled 2023-12-30 (×2): qty 2

## 2023-12-30 MED ORDER — PIPERACILLIN-TAZOBACTAM 3.375 G IVPB 30 MIN: 3.3750 g | Freq: Once | INTRAVENOUS | Status: DC

## 2023-12-30 MED ORDER — SODIUM CHLORIDE 0.9 % IV BOLUS
1000.0000 mL | Freq: Once | INTRAVENOUS | Status: AC
  Administered 2023-12-30: 1000 mL via INTRAVENOUS

## 2023-12-30 MED ORDER — ENOXAPARIN SODIUM 40 MG/0.4ML IJ SOSY: 40.0000 mg | PREFILLED_SYRINGE | INTRAMUSCULAR | Status: DC

## 2023-12-30 MED ORDER — ONDANSETRON HCL 4 MG/2ML IJ SOLN: 4.0000 mg | Freq: Four times a day (QID) | INTRAMUSCULAR | Status: DC | PRN

## 2023-12-30 MED ORDER — BOOST / RESOURCE BREEZE PO LIQD CUSTOM
1.0000 | Freq: Three times a day (TID) | ORAL | Status: DC
  Administered 2023-12-31 (×3): 1 via ORAL
  Administered 2024-01-01: 237 mL via ORAL

## 2023-12-30 MED ORDER — DROPERIDOL 2.5 MG/ML IJ SOLN: 2.5000 mg | Freq: Once | INTRAMUSCULAR | Status: DC

## 2023-12-30 MED ORDER — POTASSIUM CHLORIDE 10 MEQ/100ML IV SOLN
10.0000 meq | INTRAVENOUS | Status: AC
  Administered 2023-12-30 (×2): 10 meq via INTRAVENOUS
  Filled 2023-12-30 (×2): qty 100

## 2023-12-30 MED ORDER — LORAZEPAM 2 MG/ML IJ SOLN
2.0000 mg | Freq: Once | INTRAMUSCULAR | Status: AC
  Administered 2023-12-30: 2 mg via INTRAVENOUS
  Filled 2023-12-30: qty 1

## 2023-12-30 MED ORDER — PROCHLORPERAZINE EDISYLATE 10 MG/2ML IJ SOLN
10.0000 mg | Freq: Four times a day (QID) | INTRAMUSCULAR | Status: DC | PRN
  Administered 2023-12-31: 10 mg via INTRAVENOUS
  Filled 2023-12-30: qty 2

## 2023-12-30 MED ORDER — FENTANYL CITRATE PF 50 MCG/ML IJ SOSY
50.0000 ug | PREFILLED_SYRINGE | INTRAMUSCULAR | Status: DC | PRN
  Administered 2023-12-30 (×3): 50 ug via INTRAVENOUS
  Filled 2023-12-30 (×3): qty 1

## 2023-12-30 MED ORDER — MORPHINE SULFATE (PF) 4 MG/ML IV SOLN
4.0000 mg | Freq: Once | INTRAVENOUS | Status: AC
  Administered 2023-12-30: 4 mg via INTRAVENOUS
  Filled 2023-12-30: qty 1

## 2023-12-30 MED ORDER — ENOXAPARIN SODIUM 30 MG/0.3ML IJ SOSY
30.0000 mg | PREFILLED_SYRINGE | INTRAMUSCULAR | Status: DC
  Administered 2023-12-30: 30 mg via SUBCUTANEOUS
  Filled 2023-12-30: qty 0.3

## 2023-12-30 MED ORDER — SODIUM CHLORIDE 0.9 % IV SOLN
2.0000 g | INTRAVENOUS | Status: DC
  Administered 2023-12-30 – 2023-12-31 (×2): 2 g via INTRAVENOUS
  Filled 2023-12-30 (×2): qty 20

## 2023-12-30 NOTE — ED Triage Notes (Signed)
 Patient said he has gastritis and his whole body has been cramping since Friday. Has some diarrhea and has been vomiting. Stated he cannot eat anything.

## 2023-12-30 NOTE — H&P (Signed)
 History and Physical    Patient: Victor Johnson ZOX:[id number] DOB: 14-Oct-1976 DOA: 12/30/2023 DOS: the patient was seen and examined on 12/30/2023 PCP: Grayce Sessions, NP  Patient coming from: Home  Chief Complaint:  Chief Complaint  Patient presents with   Emesis   HPI: Victor Johnson is a 47 y.o. male with medical history significant of gastritis, hypertension, intestinal polyp, history of AKI who presented to the emergency department complaints of abdominal pain, multiple episodes of nausea and vomiting that started Friday.  No sick contacts or travel history.  Does not think he ate anything to cause the symptoms.  He smokes cannabis daily, but stated he does not get nausea or vomiting from his use.  He also drinks 2-3 wine coolers daily as well.  No history of EtOH related admissions.  No other recreational drug use. No constipation, melena or hematochezia.  No flank pain, dysuria, frequency or hematuria but has significant oliguria.  No polyuria, polydipsia, polyphagia or blurred vision.  He denied fever, chills, rhinorrhea, sore throat, wheezing or hemoptysis.  No chest pain, palpitations, diaphoresis, PND, orthopnea or pitting edema of the lower extremities.   Lab work: Urinalysis was cloudy with small bilirubin ketones of 100 mg/dL and trace leukocyte esterase.  Microscopic examination was unremarkable.  CBC showed white count 21.5, hemoglobin 17.8 g/dL platelets 811.  Lipase was 60.  CMP shows a corrected sodium of 135, potassium 3.2, chloride 93 and CO2 22 mmol/L with an anion gap of 18.  Glucose 167, BUN 26 and creatinine 4.53 mg/dL and calcium 91.4 mg/dL (corrected 9.0).  Total protein was 9.2 g/dL, the rest of the LFTs were normal.  Imaging: CT abdomen/pelvis without contrast showing acute sigmoid diverticulitis.  No perforation or abscess.  No hydronephrosis or nephrolithiasis.   ED course: Initial vital signs were temperature 97.6 F, pulse 127, respiration 19, BP 144/85 mmHg  O2 sat 100% on room air.  The patient received 1000 mL of LR bolus, lorazepam 2 mg IVP and morphine 4 mg IVP.  I added another 1000 mL of normal saline bolus and KCl 10 mEq IVPB x 2.  Review of Systems: As mentioned in the history of present illness. All other systems reviewed and are negative.  Past Medical History:  Diagnosis Date   Gastritis    Hypertension    Polyp of intestine    History reviewed. No pertinent surgical history. Social History:  reports that he has been smoking cigarettes. He has never used smokeless tobacco. He reports current alcohol use. He reports current drug use. Drug: Marijuana.  No Known Allergies  History reviewed. No pertinent family history.  Prior to Admission medications   Medication Sig Start Date End Date Taking? Authorizing Provider  ondansetron (ZOFRAN) 4 MG tablet Take 4 mg by mouth daily. 12/16/23  Yes [provider]  acetaminophen (TYLENOL) 500 MG tablet Take 1 tablet (500 mg total) by mouth every 6 (six) hours as needed. 12/07/23   Elmer Picker, NP  cyclobenzaprine (FLEXERIL) 10 MG tablet Take 1 tablet (10 mg total) by mouth 2 (two) times daily as needed for muscle spasms. 12/11/22   Grayce Sessions, NP  pantoprazole (PROTONIX) 40 MG tablet Take 1 tablet (40 mg total) by mouth 2 (two) times daily before a meal. 05/02/23 12/07/23  Hongalgi, Maximino Greenland, MD  sucralfate (CARAFATE) 1 g tablet Mix 1 tablet in 10 ml of H2O and drink 4 (four) times daily -  with meals and at bedtime for 14 days. 05/02/23  12/07/23  Elease Etienne, MD    Physical Exam: Vitals:   12/30/23 0802 12/30/23 0806 12/30/23 1100 12/30/23 1200  BP: (!) 144/85  (!) 113/100 94/79  Pulse:  (!) 127 (!) 119 (!) 120  Resp: 19  18 18   Temp: 97.6 F (36.4 C)   98.7 F (37.1 C)  TempSrc: Oral   Oral  SpO2: 100%  98% 94%  Weight: 65.8 kg     Height: 5\' 8"  (1.727 m)      Physical Exam Vitals and nursing note reviewed.  Constitutional:      Appearance: Normal appearance.   HENT:     Head: Normocephalic.     Mouth/Throat:     Mouth: Mucous membranes are moist.  Cardiovascular:     Rate and Rhythm: Normal rate and regular rhythm.  Musculoskeletal:     Cervical back: Neck supple.  Neurological:     Mental Status: He is alert.     Data Reviewed:  Results are pending, will review when available. EKG: Vent. rate 133 BPM PR interval 117 ms QRS duration 82 ms QT/QTcB 354/527 ms P-R-T axes 93 80 268 Sinus tachycardia Probable left atrial enlargement LVH with secondary repolarization abnormality Prolonged QT interval  Assessment and Plan: Principal Problem:   Intractable nausea and vomiting Associated with:   Abdominal pain In the setting of:   Acute diverticulitis (by imaging) Complicated by:   AKI (acute kidney injury) (HCC) With partially compensated:   Metabolic acidosis Inpatient/telemetry. Continue IV fluids. -NS bolus 1000 mL x 2. -NS infusion at 125 mL/h. Avoid hypotension. Avoid nephrotoxins. Monitor intake and output. Monitor renal function electrolytes. Keep n.p.o. for now. Advance diet as tolerated. Analgesics as needed. Antiemetics as needed. Pantoprazole 40 mg IVP daily. Follow CBC, CMP and lipase in AM. Admit to PCU/inpatient. Begin ceftriaxone 1 g IVPB every 24 hours. Begin metronidazole 500 mg IVPB q 12 hr. Follow CBC and CMP in a.m.  Active Problems:   Hyperproteinemia Secondary to volume depletion. Continue IV fluids. Follow total protein level.    Elevated lipase Might be due to hemoconcentration as well.    Hyperglycemia Check fasting glucose in AM    Polycythemia   Thrombocytosis Secondary to hemoconcentration. Follow-up CBC in AM after IV hydration.    Leukocytosis Infection?  Plus hemoconcentration. Continue IV fluids and antibiotics. Follow-up CBC in the morning.    Hypokalemia Secondary to GI losses. Continue replacement. Follow-up potassium level in AM.    Prolonged QT  interval Optimize electrolytes. Avoid QT prolonging meds. Check magnesium level.    HTN (hypertension) Currently not on antihypertensives. Antihypertensives as needed while in the hospital.    Alcohol abuse  As stated only drinks 2-3 wine coolers daily. When asked he stated: No Hx of alcohol withdrawals or alcohol related ED visits/hospitalizations.    Cannabis use disorder Smokes multiple times daily. Could be the cause of his symptoms. Stated he never gets nausea and vomiting with cannabis use.    Tobacco use disorder Tobacco cessation advised. Nicotine replacement therapy with patch offered. -Declined replacement therapy at the moment. -Informed that we will also have nicotine gum if interested later.   Advance Care Planning:   Code Status: Full Code   Consults:   Family Communication:   Severity of Illness: The appropriate patient status for this patient is INPATIENT. Inpatient status is judged to be reasonable and necessary in order to provide the required intensity of service to ensure the patient's safety. The patient's presenting symptoms, physical  exam findings, and initial radiographic and laboratory data in the context of their chronic comorbidities is felt to place them at high risk for further clinical deterioration. Furthermore, it is not anticipated that the patient will be medically stable for discharge from the hospital within 2 midnights of admission.   * I certify that at the point of admission it is my clinical judgment that the patient will require inpatient hospital care spanning beyond 2 midnights from the point of admission due to high intensity of service, high risk for further deterioration and high frequency of surveillance required.*  Author: Bobette Mo, MD 12/30/2023 1:50 PM  For on call review [email or web address].   This document was prepared using Dragon voice recognition software and may contain some unintended transcription errors.

## 2023-12-30 NOTE — ED Provider Triage Note (Signed)
 Emergency Medicine Provider Triage Evaluation Note  Victor Johnson , a 48 y.o. male  was evaluated in triage.  Pt complains of 3 day Hx of "gastritis". Reports multiple episodes of nausea, vomiting, diarrhea. Reports last bowel movement being more solid. Says he has whole body muscle spasms accompanied with LLQ abdominal pain. Has been taking zofran with no relief.   Denies melena, hematochezia.   Review of Systems  Positive: N/a Negative: N/a  Physical Exam  BP (!) 144/85   Pulse (!) 127   Temp 97.6 F (36.4 C) (Oral)   Resp 19   Ht 5\' 8"  (1.727 m)   Wt 65.8 kg   SpO2 100%   BMI 22.05 kg/m  Gen:   Awake, no distress   Resp:  Normal effort  MSK:   Moves extremities without difficulty  Other:    Medical Decision Making  Medically screening exam initiated at 8:26 AM.  Appropriate orders placed.  Adrian Dinovo was informed that the remainder of the evaluation will be completed by another provider, this initial triage assessment does not replace that evaluation, and the importance of remaining in the ED until their evaluation is complete.     Lunette Stands, [place] 12/30/23 0830

## 2023-12-30 NOTE — ED Notes (Signed)
 Pt made aware of UA sample. Pt states "I can't pee unless I get some IV fluids"

## 2023-12-30 NOTE — ED Provider Notes (Signed)
 [facility] Provider Note  CSN: [id number] Arrival date & time: 12/30/23 8657  Chief Complaint(s) Emesis  HPI Victor Johnson is a 48 y.o. male here today for vomiting.  Patient with history of gastritis, reports that since Friday he has been able to eat or drink.  Patient does drink alcohol daily, last drink was on Thursday.  Patient does not feel as though he is withdrawing.   Past Medical History Past Medical History:  Diagnosis Date   Gastritis    Hypertension    Polyp of intestine    Patient Active Problem List   Diagnosis Date Noted   AKI (acute kidney injury) (HCC) 12/30/2023   Prolonged QT interval 12/30/2023   Cannabis use disorder 12/30/2023   Leukocytosis 12/30/2023   Metabolic acidosis 12/30/2023   Hyperproteinemia 12/30/2023   Hyperglycemia 12/30/2023   Elevated lipase 12/30/2023   Polycythemia 12/30/2023   Thrombocytosis 12/30/2023   Acute diverticulitis 12/30/2023   Tobacco use disorder 12/30/2023   Alcohol abuse 12/30/2023   Intractable nausea and vomiting 07/09/2022   Acute kidney failure (HCC) 07/08/2022   Nausea and vomiting 07/08/2022   Abdominal pain 07/08/2022   Hypokalemia 07/08/2022   HTN (hypertension) 07/08/2022   Home Medication(s) Prior to Admission medications   Medication Sig Start Date End Date Taking? Authorizing Provider  acetaminophen (TYLENOL) 500 MG tablet Take 1 tablet (500 mg total) by mouth every 6 (six) hours as needed. Patient taking differently: Take 500 mg by mouth every 6 (six) hours as needed (for headaches). 12/07/23  Yes Elmer Picker, NP  cyclobenzaprine (FLEXERIL) 10 MG tablet Take 1 tablet (10 mg total) by mouth 2 (two) times daily as needed for muscle spasms. 12/11/22  Yes Grayce Sessions, NP  ondansetron (ZOFRAN) 4 MG tablet Take 4 mg by mouth daily. 12/16/23  Yes [provider]  pantoprazole (PROTONIX) 40 MG tablet Take 1 tablet (40 mg total) by mouth 2 (two) times daily before a meal.  05/02/23 12/30/23 Yes Hongalgi, Maximino Greenland, MD  sucralfate (CARAFATE) 1 g tablet Take 1 g by mouth 2 (two) times daily.   Yes [provider]  sucralfate (CARAFATE) 1 g tablet Mix 1 tablet in 10 ml of H2O and drink 4 (four) times daily -  with meals and at bedtime for 14 days. Patient not taking: Reported on 12/30/2023 05/02/23 12/07/23  Elease Etienne, MD                                                                                                                                    Past Surgical History History reviewed. No pertinent surgical history. Family History History reviewed. No pertinent family history.  Social History Social History   Tobacco Use   Smoking status: Every Day    Current packs/day: 1.00    Types: Cigarettes   Smokeless tobacco: Never  Substance Use Topics   Alcohol use: Yes  Comment: mikes 2 x daily   Drug use: Yes    Types: Marijuana    Comment: 2 x daily   Allergies Patient has no known allergies.  Review of Systems Review of Systems  Physical Exam Vital Signs  I have reviewed the triage vital signs BP 114/83 (BP Location: Right Arm)   Pulse 87   Temp 98.4 F (36.9 C) (Oral)   Resp 14   Ht 5\' 8"  (1.727 m)   Wt 65.8 kg   SpO2 100%   BMI 22.05 kg/m   Physical Exam Vitals reviewed.  Cardiovascular:     Rate and Rhythm: Tachycardia present.  Pulmonary:     Effort: Pulmonary effort is normal.  Abdominal:     General: Abdomen is flat.     Palpations: Abdomen is soft.     Tenderness: There is abdominal tenderness.  Skin:    General: Skin is warm and dry.  Neurological:     Mental Status: He is alert.     ED Results and Treatments Labs (all labs ordered are listed, but only abnormal results are displayed) Labs Reviewed  LIPASE, BLOOD - Abnormal; Notable for the following components:      Result Value   Lipase 60 (*)    All other components within normal limits  COMPREHENSIVE METABOLIC PANEL - Abnormal; Notable for the  following components:   Sodium 133 (*)    Potassium 3.2 (*)    Chloride 93 (*)    Glucose, Bld 167 (*)    BUN 26 (*)    Creatinine, Ser 4.53 (*)    Total Protein 9.2 (*)    GFR, Estimated 15 (*)    Anion gap 18 (*)    All other components within normal limits  CBC - Abnormal; Notable for the following components:   WBC 21.5 (*)    Hemoglobin 17.8 (*)    Platelets 453 (*)    All other components within normal limits  URINALYSIS, ROUTINE W REFLEX MICROSCOPIC - Abnormal; Notable for the following components:   Color, Urine AMBER (*)    APPearance CLOUDY (*)    Bilirubin Urine SMALL (*)    Protein, ur 100 (*)    Leukocytes,Ua TRACE (*)    All other components within normal limits  CBC - Abnormal; Notable for the following components:   WBC 13.7 (*)    All other components within normal limits  COMPREHENSIVE METABOLIC PANEL - Abnormal; Notable for the following components:   BUN 21 (*)    Creatinine, Ser 1.44 (*)    Calcium 8.6 (*)    AST 12 (*)    All other components within normal limits  CBC - Abnormal; Notable for the following components:   WBC 10.8 (*)    RBC 3.97 (*)    Hemoglobin 12.6 (*)    HCT 38.3 (*)    All other components within normal limits  BASIC METABOLIC PANEL - Abnormal; Notable for the following components:   Calcium 8.1 (*)    All other components within normal limits  MAGNESIUM  PHOSPHORUS  MAGNESIUM  HIV ANTIBODY (ROUTINE TESTING W REFLEX)  Radiology No results found.  Pertinent labs & imaging results that were available during my care of the patient were reviewed by me and considered in my medical decision making (see MDM for details).  Medications Ordered in ED Medications  cefTRIAXone (ROCEPHIN) 2 g in sodium chloride 0.9 % 100 mL IVPB (2 g Intravenous New Bag/Given 12/31/23 1501)  metroNIDAZOLE (FLAGYL) IVPB 500 mg (500 mg  Intravenous New Bag/Given 01/01/24 0243)  acetaminophen (TYLENOL) tablet 650 mg (650 mg Oral Given 01/01/24 0425)    Or  acetaminophen (TYLENOL) suppository 650 mg ( Rectal See Alternative 01/01/24 0425)  prochlorperazine (COMPAZINE) injection 10 mg (10 mg Intravenous Given 12/31/23 0826)  feeding supplement (BOOST / RESOURCE BREEZE) liquid 1 Container (237 mLs Oral Given 01/01/24 0816)  methocarbamol (ROBAXIN) tablet 500 mg (500 mg Oral Given 12/31/23 2239)  0.9 %  sodium chloride infusion ( Intravenous New Bag/Given 12/31/23 8295)  Oral care mouth rinse (has no administration in time range)  enoxaparin (LOVENOX) injection 40 mg (40 mg Subcutaneous Given 01/01/24 0817)  oxyCODONE (Oxy IR/ROXICODONE) immediate release tablet 5 mg (5 mg Oral Given 01/01/24 0425)  colchicine tablet 0.6 mg (0.6 mg Oral Given 01/01/24 0816)  lactated ringers bolus 1,000 mL (0 mLs Intravenous Stopped 12/30/23 1628)  LORazepam (ATIVAN) injection 2 mg (2 mg Intravenous Given 12/30/23 1111)  morphine (PF) 4 MG/ML injection 4 mg (4 mg Intravenous Given 12/30/23 1206)  sodium chloride 0.9 % bolus 1,000 mL (1,000 mLs Intravenous New Bag/Given 12/30/23 1519)  potassium chloride 10 mEq in 100 mL IVPB (10 mEq Intravenous New Bag/Given 12/30/23 1556)  pantoprazole (PROTONIX) injection 40 mg (40 mg Intravenous Given 12/30/23 1514)  sodium chloride 0.9 % bolus 1,000 mL (0 mLs Intravenous Stopped 12/30/23 1627)                                                                                                                                     Procedures Procedures  (including critical care time)  Medical Decision Making / ED Course   This patient presents to the ED for concern of abdominal pain, this involves an extensive number of treatment options, and is a complaint that carries with it a high risk of complications and morbidity.  The differential diagnosis includes gastritis, pancreatitis, dehydration, less likely peptic ulcer disease,  less likely bowel perforation, less likely cholecystitis, less likely sepsis.  MDM: Patient overall looks well, mildly uncomfortable.  States that this does feel like his gastritis, and upon my evaluation, inclined to believe with him.  Labs drawn at triage do show a leukocytosis, I believe this is reactive.  I do not believe patient has sepsis at this time.  Patient's lipase elevated, kidney function elevated.  Will provide IV fluids.  Reassessment, patient with significantly worsening renal function.  IV fluids provided.  Will admit to hospitalist   Additional history obtained: -Additional history obtained from  -External records from outside source  obtained and reviewed including: Chart review including previous notes, labs, imaging, consultation notes   Lab Tests: -I ordered, reviewed, and interpreted labs.   The pertinent results include:   Labs Reviewed  LIPASE, BLOOD - Abnormal; Notable for the following components:      Result Value   Lipase 60 (*)    All other components within normal limits  COMPREHENSIVE METABOLIC PANEL - Abnormal; Notable for the following components:   Sodium 133 (*)    Potassium 3.2 (*)    Chloride 93 (*)    Glucose, Bld 167 (*)    BUN 26 (*)    Creatinine, Ser 4.53 (*)    Total Protein 9.2 (*)    GFR, Estimated 15 (*)    Anion gap 18 (*)    All other components within normal limits  CBC - Abnormal; Notable for the following components:   WBC 21.5 (*)    Hemoglobin 17.8 (*)    Platelets 453 (*)    All other components within normal limits  URINALYSIS, ROUTINE W REFLEX MICROSCOPIC - Abnormal; Notable for the following components:   Color, Urine AMBER (*)    APPearance CLOUDY (*)    Bilirubin Urine SMALL (*)    Protein, ur 100 (*)    Leukocytes,Ua TRACE (*)    All other components within normal limits  CBC - Abnormal; Notable for the following components:   WBC 13.7 (*)    All other components within normal limits  COMPREHENSIVE METABOLIC  PANEL - Abnormal; Notable for the following components:   BUN 21 (*)    Creatinine, Ser 1.44 (*)    Calcium 8.6 (*)    AST 12 (*)    All other components within normal limits  CBC - Abnormal; Notable for the following components:   WBC 10.8 (*)    RBC 3.97 (*)    Hemoglobin 12.6 (*)    HCT 38.3 (*)    All other components within normal limits  BASIC METABOLIC PANEL - Abnormal; Notable for the following components:   Calcium 8.1 (*)    All other components within normal limits  MAGNESIUM  PHOSPHORUS  MAGNESIUM  HIV ANTIBODY (ROUTINE TESTING W REFLEX)      EKG   EKG Interpretation Date/Time:  Monday December 30 2023 08:10:30 EST Ventricular Rate:  133 PR Interval:  117 QRS Duration:  82 QT Interval:  354 QTC Calculation: 527 R Axis:   80  Text Interpretation: Sinus tachycardia Probable left atrial enlargement LVH with secondary repolarization abnormality Prolonged QT interval Confirmed by Estanislado Pandy [phone]) on 12/31/2023 9:01:58 PM         Imaging Studies ordered: I ordered imaging studies including  I independently visualized and interpreted imaging. I agree with the radiologist interpretation   Medicines ordered and prescription drug management: Meds ordered this encounter  Medications   lactated ringers bolus 1,000 mL   DISCONTD: droperidol (INAPSINE) 2.5 MG/ML injection 2.5 mg   LORazepam (ATIVAN) injection 2 mg   morphine (PF) 4 MG/ML injection 4 mg   DISCONTD: piperacillin-tazobactam (ZOSYN) IVPB 3.375 g    Antibiotic Indication::   Intra-abdominal Infection   cefTRIAXone (ROCEPHIN) 2 g in sodium chloride 0.9 % 100 mL IVPB    Antibiotic Indication::   Intra-abdominal   metroNIDAZOLE (FLAGYL) IVPB 500 mg    Antibiotic Indication::   Intra-abdominal Infection   sodium chloride 0.9 % bolus 1,000 mL   potassium chloride 10 mEq in 100 mL IVPB   DISCONTD: enoxaparin (  LOVENOX) injection 40 mg   OR Linked Order Group    acetaminophen (TYLENOL) tablet 650 mg     acetaminophen (TYLENOL) suppository 650 mg   DISCONTD: ondansetron (ZOFRAN) tablet 4 mg   DISCONTD: ondansetron (ZOFRAN) injection 4 mg   DISCONTD: enoxaparin (LOVENOX) injection 30 mg   pantoprazole (PROTONIX) injection 40 mg   DISCONTD: fentaNYL (SUBLIMAZE) injection 50 mcg   prochlorperazine (COMPAZINE) injection 10 mg   sodium chloride 0.9 % bolus 1,000 mL   feeding supplement (BOOST / RESOURCE BREEZE) liquid 1 Container   methocarbamol (ROBAXIN) tablet 500 mg   0.9 %  sodium chloride infusion   Oral care mouth rinse   enoxaparin (LOVENOX) injection 40 mg   oxyCODONE (Oxy IR/ROXICODONE) immediate release tablet 5 mg   colchicine tablet 0.6 mg    -I have reviewed the patients home medicines and have made adjustments as needed   Reevaluation: After the interventions noted above, I reevaluated the patient and found that they have :improved  Co morbidities that complicate the patient evaluation  Past Medical History:  Diagnosis Date   Gastritis    Hypertension    Polyp of intestine       Dispostion: Admission     Final Clinical Impression(s) / ED Diagnoses Final diagnoses:  Vomiting, unspecified vomiting type, unspecified whether nausea present  Acute kidney injury (HCC)     @PCDICTATION @    Anders Simmonds T, DO 01/01/24 0825

## 2023-12-31 ENCOUNTER — Ambulatory Visit: Payer: Medicaid Other

## 2023-12-31 DIAGNOSIS — N179 Acute kidney failure, unspecified: Secondary | ICD-10-CM | POA: Diagnosis not present

## 2023-12-31 LAB — COMPREHENSIVE METABOLIC PANEL
ALT: 11 U/L (ref 0–44)
AST: 12 U/L — ABNORMAL LOW (ref 15–41)
Albumin: 3.6 g/dL (ref 3.5–5.0)
Alkaline Phosphatase: 45 U/L (ref 38–126)
Anion gap: 13 (ref 5–15)
BUN: 21 mg/dL — ABNORMAL HIGH (ref 6–20)
CO2: 24 mmol/L (ref 22–32)
Calcium: 8.6 mg/dL — ABNORMAL LOW (ref 8.9–10.3)
Chloride: 102 mmol/L (ref 98–111)
Creatinine, Ser: 1.44 mg/dL — ABNORMAL HIGH (ref 0.61–1.24)
GFR, Estimated: >60 mL/min (ref >60)
Glucose, Bld: 99 mg/dL (ref 70–99)
Potassium: 3.8 mmol/L (ref 3.5–5.1)
Sodium: 139 mmol/L (ref 135–145)
Total Bilirubin: 1.1 mg/dL (ref 0.0–1.2)
Total Protein: 6.8 g/dL (ref 6.5–8.1)

## 2023-12-31 LAB — CBC
HCT: 41.3 % (ref 39.0–52.0)
Hemoglobin: 14 g/dL (ref 13.0–17.0)
MCH: 32 pg (ref 26.0–34.0)
MCHC: 33.9 g/dL (ref 30.0–36.0)
MCV: 94.3 fL (ref 80.0–100.0)
Platelets: 318 10*3/uL (ref 150–400)
RBC: 4.38 MIL/uL (ref 4.22–5.81)
RDW: 13 % (ref 11.5–15.5)
WBC: 13.7 10*3/uL — ABNORMAL HIGH (ref 4.0–10.5)
nRBC: 0 % (ref 0.0–0.2)

## 2023-12-31 LAB — HIV ANTIBODY (ROUTINE TESTING W REFLEX): HIV Screen 4th Generation wRfx: NONREACTIVE

## 2023-12-31 MED ORDER — ENOXAPARIN SODIUM 40 MG/0.4ML IJ SOSY
40.0000 mg | PREFILLED_SYRINGE | Freq: Every day | INTRAMUSCULAR | Status: DC
  Administered 2023-12-31 – 2024-01-01 (×2): 40 mg via SUBCUTANEOUS
  Filled 2023-12-31 (×2): qty 0.4

## 2023-12-31 MED ORDER — ORAL CARE MOUTH RINSE: 15.0000 mL | OROMUCOSAL | Status: DC | PRN

## 2023-12-31 MED ORDER — SODIUM CHLORIDE 0.9 % IV SOLN: INTRAVENOUS | Status: DC

## 2023-12-31 MED ORDER — OXYCODONE HCL 5 MG PO TABS
5.0000 mg | ORAL_TABLET | Freq: Four times a day (QID) | ORAL | Status: DC | PRN
  Administered 2023-12-31 – 2024-01-01 (×3): 5 mg via ORAL
  Filled 2023-12-31 (×3): qty 1

## 2023-12-31 NOTE — Plan of Care (Signed)

## 2023-12-31 NOTE — Progress Notes (Signed)
 PROGRESS NOTE  Shannon Kirkendall  ONG:[id number] DOB: [birth year] DOA: 12/30/2023 PCP: Grayce Sessions, NP   Brief Narrative: Patient is a 48 year old male with history of gastritis, hypertension, intestinal polyp who presented to the emergency department from home with complaint of abdominal pain, nausea, vomiting.  On presentation, he was hemodynamically stable.  Lab work showed creatinine of 4.5, WBC count of 21.5, lipase of 60, potassium 3.2.  CT abdomen/pelvis showed acute sigmoid diverticulitis.  Started on antibiotics, IV fluid.  Significant improvement in the kidney function today as well as abdominal pain.  Diet advanced to full liquid, plan for gradual advancement  Assessment & Plan:  Principal Problem:   AKI (acute kidney injury) (HCC) Active Problems:   Hypokalemia   HTN (hypertension)   Intractable nausea and vomiting   Abdominal pain   Prolonged QT interval   Cannabis use disorder   Leukocytosis   Metabolic acidosis   Hyperproteinemia   Hyperglycemia   Elevated lipase   Polycythemia   Thrombocytosis   Acute diverticulitis   Tobacco use disorder   Alcohol abuse   Intractable abdominal pain/nausea/vomiting: From sigmoid diverticulitis. Started on  pain management, supportive  care, IV fluids, antiemetics.  Abdomen looks benign today.  Significant improvement in the abdomen pain.  No nausea or vomiting.  Advance to full.  Will advance to soft if tolerates.  AKI: Baseline creatinine normal.  Presented with creatinine in the range of 4.  No evidence of hydronephrosis as per CT.  This is most likely from prerenal AKI from nausea and vomiting, poor oral intake.  Continue gentle IV fluids for today.  Kidney function significantly improving  Acute sigmoid diverticulitis: Presented with abdominal pain.  No evidence of perforation or abscess.  Continue current antibiotics.  Cultures were not sent on admission.   Leukocytosis improving.  He follows with GI, Dr. Levora Angel.  He  needs to follow-up with GI in 6 to 8 weeks for repeat colonoscopy  Hypokalemia: Supplemented potassium  Marijuana use/tobacco use: Smokes multiple times daily.  Counseled for cessation.  Denied nicotine patch        DVT prophylaxis:enoxaparin (LOVENOX) injection 30 mg Start: 12/30/23 2200     Code Status: Full Code  Family Communication: None at the bedside  Patient status:Inpatient  Patient is from :Home  Anticipated discharge GM:[facility]  Estimated DC date: Tomorrow   Consultants: None  Procedures:None  Antimicrobials:  Anti-infectives (From admission, onward)    Start     Dose/Rate Route Frequency Ordered Stop   12/30/23 1430  cefTRIAXone (ROCEPHIN) 2 g in sodium chloride 0.9 % 100 mL IVPB        2 g 200 mL/hr over 30 Minutes Intravenous Every 24 hours 12/30/23 1402     12/30/23 1430  metroNIDAZOLE (FLAGYL) IVPB 500 mg        500 mg 100 mL/hr over 60 Minutes Intravenous Every 12 hours 12/30/23 1402     12/30/23 1345  piperacillin-tazobactam (ZOSYN) IVPB 3.375 g  Status:  Discontinued        3.375 g 100 mL/hr over 30 Minutes Intravenous  Once 12/30/23 1338 12/30/23 1402       Subjective: Patient seen and examined at bedside today.  Hemodynamically stable.  Looks comfortable.  Not in any kind of distress.  Abdomen pain has significantly improved today.  He wants to advance the diet.  Objective: Vitals:   12/30/23 1739 12/30/23 1819 12/31/23 0016 12/31/23 0436  BP: (!) 130/114 134/88 112/85 115/87  Pulse: (!) 112 Marland Kitchen)  102 96 92  Resp: 14 16 18 18   Temp:  98.5 F (36.9 C) 98.6 F (37 C) 98.4 F (36.9 C)  TempSrc:  Oral Oral Oral  SpO2: 93% 97% 100% 100%  Weight:      Height:        Intake/Output Summary (Last 24 hours) at 12/31/2023 0752 Last data filed at 12/30/2023 1840 Gross per 24 hour  Intake 2318.37 ml  Output --  Net 2318.37 ml   Filed Weights   12/30/23 0802  Weight: 65.8 kg    Examination:  General exam: Overall comfortable, not in  distress HEENT: PERRL Respiratory system:  no wheezes or crackles  Cardiovascular system: S1 & S2 heard, RRR.  Gastrointestinal system: Abdomen is nondistended, soft and is mildly tender in the left lower quadrant Central nervous system: Alert and oriented Extremities: No edema, no clubbing ,no cyanosis Skin: No rashes, no ulcers,no icterus     Data Reviewed: I have personally reviewed following labs and imaging studies  CBC: Recent Labs  Lab 12/30/23 0822 12/31/23 0546  WBC 21.5* 13.7*  HGB 17.8* 14.0  HCT 51.1 41.3  MCV 92.1 94.3  PLT 453* 318   Basic Metabolic Panel: Recent Labs  Lab 12/30/23 0822 12/30/23 1830 12/31/23 0546  NA 133*  --  139  K 3.2*  --  3.8  CL 93*  --  102  CO2 22  --  24  GLUCOSE 167*  --  99  BUN 26*  --  21*  CREATININE 4.53*  --  1.44*  CALCIUM 10.0  --  8.6*  MG 2.3 1.9  --   PHOS 2.6  --   --      No results found for this or any previous visit (from the past 240 hours).   Radiology Studies: CT ABDOMEN PELVIS WO CONTRAST Result Date: 12/30/2023 CLINICAL DATA:  Left lower quadrant abdominal pain. EXAM: CT ABDOMEN AND PELVIS WITHOUT CONTRAST TECHNIQUE: Multidetector CT imaging of the abdomen and pelvis was performed following the standard protocol without IV contrast. RADIATION DOSE REDUCTION: This exam was performed according to the departmental dose-optimization program which includes automated exposure control, adjustment of the mA and/or kV according to patient size and/or use of iterative reconstruction technique. COMPARISON:  CT abdomen pelvis dated 05/01/2023. FINDINGS: Evaluation of this exam is limited in the absence of intravenous contrast. Lower chest: The visualized lung bases are clear. No intra-abdominal free air or free fluid. Hepatobiliary: No focal liver abnormality is seen. No gallstones, gallbladder wall thickening, or biliary dilatation. Pancreas: Unremarkable. No pancreatic ductal dilatation or surrounding inflammatory  changes. Spleen: Normal in size without focal abnormality. Adrenals/Urinary Tract: The adrenal glands are unremarkable. There is no hydronephrosis or nephrolithiasis on either side. The visualized ureters appear unremarkable. The urinary bladder is collapsed. Stomach/Bowel: There is moderate sigmoid diverticulosis and mild scattered colonic diverticula. There is active inflammatory changes of the sigmoid diverticula. No perforation or abscess. There is no bowel obstruction. The appendix is normal. Vascular/Lymphatic: The abdominal aorta and IVC are unremarkable. No portal venous gas. There is no adenopathy. Reproductive: The prostate and seminal vesicles are grossly unremarkable. No pelvic mass. Other: None Musculoskeletal: No acute osseous pathology. IMPRESSION: 1. Acute sigmoid diverticulitis. No perforation or abscess. 2. No hydronephrosis or nephrolithiasis. Electronically Signed   By: Elgie Collard M.D.   On: 12/30/2023 13:33    Scheduled Meds:  enoxaparin (LOVENOX) injection  30 mg Subcutaneous Q24H   feeding supplement  1 Container Oral TID BM  Continuous Infusions:  cefTRIAXone (ROCEPHIN)  IV Stopped (12/30/23 1514)   metronidazole 500 mg (12/31/23 0334)     LOS: 1 day   Burnadette Pop, MD [facility] P2/25/2025, 7:52 AM

## 2023-12-31 NOTE — Progress Notes (Signed)
 Patient medicated x 1 with Compazine this shift with effective results, able to tolerate fluids and medicated with tylenol and oxycodone for gout with effective results, in bed resting, call light in reach

## 2024-01-01 ENCOUNTER — Other Ambulatory Visit (HOSPITAL_COMMUNITY): Payer: Self-pay

## 2024-01-01 DIAGNOSIS — N179 Acute kidney failure, unspecified: Secondary | ICD-10-CM | POA: Diagnosis not present

## 2024-01-01 LAB — CBC
HCT: 38.3 % — ABNORMAL LOW (ref 39.0–52.0)
Hemoglobin: 12.6 g/dL — ABNORMAL LOW (ref 13.0–17.0)
MCH: 31.7 pg (ref 26.0–34.0)
MCHC: 32.9 g/dL (ref 30.0–36.0)
MCV: 96.5 fL (ref 80.0–100.0)
Platelets: 295 10*3/uL (ref 150–400)
RBC: 3.97 MIL/uL — ABNORMAL LOW (ref 4.22–5.81)
RDW: 12.6 % (ref 11.5–15.5)
WBC: 10.8 10*3/uL — ABNORMAL HIGH (ref 4.0–10.5)
nRBC: 0 % (ref 0.0–0.2)

## 2024-01-01 LAB — BASIC METABOLIC PANEL
Anion gap: 8 (ref 5–15)
BUN: 14 mg/dL (ref 6–20)
CO2: 24 mmol/L (ref 22–32)
Calcium: 8.1 mg/dL — ABNORMAL LOW (ref 8.9–10.3)
Chloride: 103 mmol/L (ref 98–111)
Creatinine, Ser: 1.05 mg/dL (ref 0.61–1.24)
GFR, Estimated: >60 mL/min (ref >60)
Glucose, Bld: 92 mg/dL (ref 70–99)
Potassium: 3.9 mmol/L (ref 3.5–5.1)
Sodium: 135 mmol/L (ref 135–145)

## 2024-01-01 MED ORDER — OXYCODONE HCL 5 MG PO TABS
5.0000 mg | ORAL_TABLET | Freq: Four times a day (QID) | ORAL | 0 refills | Status: DC | PRN
  Filled 2024-01-01: qty 10, 3d supply, fill #0

## 2024-01-01 MED ORDER — COLCHICINE 0.6 MG PO TABS
0.6000 mg | ORAL_TABLET | Freq: Two times a day (BID) | ORAL | 0 refills | Status: DC
  Filled 2024-01-01: qty 14, 7d supply, fill #0

## 2024-01-01 MED ORDER — AMOXICILLIN-POT CLAVULANATE 875-125 MG PO TABS
1.0000 | ORAL_TABLET | Freq: Two times a day (BID) | ORAL | Status: DC
Start: 2024-01-01 — End: 2024-01-01

## 2024-01-01 MED ORDER — COLCHICINE 0.6 MG PO TABS
0.6000 mg | ORAL_TABLET | Freq: Two times a day (BID) | ORAL | Status: DC
  Administered 2024-01-01: 0.6 mg via ORAL
  Filled 2024-01-01: qty 1

## 2024-01-01 MED ORDER — AMOXICILLIN-POT CLAVULANATE 875-125 MG PO TABS
1.0000 | ORAL_TABLET | Freq: Two times a day (BID) | ORAL | 0 refills | Status: AC
  Filled 2024-01-01: qty 14, 7d supply, fill #0

## 2024-01-01 NOTE — Plan of Care (Signed)

## 2024-01-01 NOTE — Discharge Summary (Signed)
 Physician Discharge Summary  Victor Johnson WJX:[id number] DOB: Sep 26, 1976 DOA: 12/30/2023  PCP: Grayce Sessions, NP  Admit date: 12/30/2023 Discharge date: 01/01/2024  Admitted From: Home Disposition:  Home  Discharge Condition:Stable CODE STATUS:FULL Diet recommendation: soft diet for next 2-3 days  Brief/Interim Summary: Patient is a 48 year old male with history of gastritis, hypertension, intestinal polyp who presented to the emergency department from home with complaint of abdominal pain, nausea, vomiting.  On presentation, he was hemodynamically stable.  Lab work showed creatinine of 4.5, WBC count of 21.5, lipase of 60, potassium 3.2.  CT abdomen/pelvis showed acute sigmoid diverticulitis.  Started on antibiotics, IV fluid.  AKI resolved.  His abdominal pain has resolved as well.  He is tolerating soft diet.  Hospital course also remarkable for gout flare, started on colchicine.  Medically stable for discharge home today.  Following problems were addressed during the hospitalization:  Intractable abdominal pain/nausea/vomiting: From sigmoid diverticulitis. Started on  pain management, supportive  care, IV fluids, antiemetics.  Abdomen looks benign today.  Significant improvement in the abdomen pain.  No nausea or vomiting.  Tolerating soft diet  AKI: Baseline creatinine normal.  Presented with creatinine in the range of 4.  No evidence of hydronephrosis as per CT.  This is most likely from prerenal AKI from nausea and vomiting, poor oral intake.  AKI resolved   Acute sigmoid diverticulitis: Presented with abdominal pain.  No evidence of perforation or abscess.  Leukocytosis resolved .he follows with GI, Dr. Levora Angel.  Antibiotics with Augmentin. he needs to follow-up with GI in 6 to 8 weeks for repeat colonoscopy  Acute gout flare: Complains of severe pain on the base of the left great toe.  Started on colchicine  Hypokalemia: Supplemented potassium   Marijuana use/tobacco use:  Smokes a pack a day.  Also smokes marijuana.  Counseled for cessation.  Denied nicotine patch  Discharge Diagnoses:  Principal Problem:   AKI (acute kidney injury) (HCC) Active Problems:   Hypokalemia   HTN (hypertension)   Intractable nausea and vomiting   Abdominal pain   Prolonged QT interval   Cannabis use disorder   Leukocytosis   Metabolic acidosis   Hyperproteinemia   Hyperglycemia   Elevated lipase   Polycythemia   Thrombocytosis   Acute diverticulitis   Tobacco use disorder   Alcohol abuse    Discharge Instructions  Discharge Instructions     Diet general   Complete by: As directed    Soft /low fiber diet for next 2 to 3 days   Discharge instructions   Complete by: As directed    1)Please take prescribed medications as instructed 2)Continue supplement for next 2 to 3 days.  Follow-up with your gastroenterologist.  You may need colonoscopy in next 6 to 8 weeks 3)Follow up with your PCP for the management of your gout   Increase activity slowly   Complete by: As directed       Allergies as of 01/01/2024   No Known Allergies      Medication List     TAKE these medications    acetaminophen 500 MG tablet Commonly known as: TYLENOL Take 1 tablet (500 mg total) by mouth every 6 (six) hours as needed. What changed: reasons to take this   amoxicillin-clavulanate 875-125 MG tablet Commonly known as: AUGMENTIN Take 1 tablet by mouth every 12 (twelve) hours for 7 days.   colchicine 0.6 MG tablet Take 1 tablet (0.6 mg total) by mouth 2 (two) times daily for 7  days.   cyclobenzaprine 10 MG tablet Commonly known as: FLEXERIL Take 1 tablet (10 mg total) by mouth 2 (two) times daily as needed for muscle spasms.   ondansetron 4 MG tablet Commonly known as: ZOFRAN Take 4 mg by mouth daily.   oxyCODONE 5 MG immediate release tablet Commonly known as: Oxy IR/ROXICODONE Take 1 tablet (5 mg total) by mouth every 6 (six) hours as needed for moderate pain  (pain score 4-6).   pantoprazole 40 MG tablet Commonly known as: Protonix Take 1 tablet (40 mg total) by mouth 2 (two) times daily before a meal.   sucralfate 1 g tablet Commonly known as: CARAFATE Take 1 g by mouth 2 (two) times daily.   sucralfate 1 g tablet Commonly known as: CARAFATE Mix 1 tablet in 10 ml of H2O and drink 4 (four) times daily -  with meals and at bedtime for 14 days.        Follow-up Information     Grayce Sessions, NP. Schedule an appointment as soon as possible for a visit in 1 week(s).   Specialty: Internal Medicine Contact information: [address] [place] [address] [phone]                No Known Allergies  Consultations: None   Procedures/Studies: CT ABDOMEN PELVIS WO CONTRAST Result Date: 12/30/2023 CLINICAL DATA:  Left lower quadrant abdominal pain. EXAM: CT ABDOMEN AND PELVIS WITHOUT CONTRAST TECHNIQUE: Multidetector CT imaging of the abdomen and pelvis was performed following the standard protocol without IV contrast. RADIATION DOSE REDUCTION: This exam was performed according to the departmental dose-optimization program which includes automated exposure control, adjustment of the mA and/or kV according to patient size and/or use of iterative reconstruction technique. COMPARISON:  CT abdomen pelvis dated 05/01/2023. FINDINGS: Evaluation of this exam is limited in the absence of intravenous contrast. Lower chest: The visualized lung bases are clear. No intra-abdominal free air or free fluid. Hepatobiliary: No focal liver abnormality is seen. No gallstones, gallbladder wall thickening, or biliary dilatation. Pancreas: Unremarkable. No pancreatic ductal dilatation or surrounding inflammatory changes. Spleen: Normal in size without focal abnormality. Adrenals/Urinary Tract: The adrenal glands are unremarkable. There is no hydronephrosis or nephrolithiasis on either side. The visualized ureters appear unremarkable. The urinary  bladder is collapsed. Stomach/Bowel: There is moderate sigmoid diverticulosis and mild scattered colonic diverticula. There is active inflammatory changes of the sigmoid diverticula. No perforation or abscess. There is no bowel obstruction. The appendix is normal. Vascular/Lymphatic: The abdominal aorta and IVC are unremarkable. No portal venous gas. There is no adenopathy. Reproductive: The prostate and seminal vesicles are grossly unremarkable. No pelvic mass. Other: None Musculoskeletal: No acute osseous pathology. IMPRESSION: 1. Acute sigmoid diverticulitis. No perforation or abscess. 2. No hydronephrosis or nephrolithiasis. Electronically Signed   By: Elgie Collard M.D.   On: 12/30/2023 13:33   XR Cervical Spine 2 or 3 views Result Date: 12/20/2023 AP and lateral radiographs of cervical spine show normal anatomical alignment, well preserved disc spacing, there is multi level facet arthropathy, most prominent at C3-C4 and C4-C5. No spondylolisthesis. No fractures or dislocations.      Subjective: Patient seen and examined at bedside today.  Hemodynamically stable for discharge.  Denies any abdomen pain, nausea or vomiting today.  Complains of severe pain on the base of the left great toe.  Discharge Exam: Vitals:   12/31/23 2116 01/01/24 0538  BP: 132/80 114/83  Pulse: (!) 105 87  Resp: 16 14  Temp: 98.2  F (36.8 C) 98.4 F (36.9 C)  SpO2: 98% 100%   Vitals:   12/31/23 1700 12/31/23 1800 12/31/23 2116 01/01/24 0538  BP:   132/80 114/83  Pulse:   (!) 105 87  Resp: 20 16 16 14   Temp:   98.2 F (36.8 C) 98.4 F (36.9 C)  TempSrc:   Oral Oral  SpO2:   98% 100%  Weight:      Height:        General: Pt is alert, awake, not in acute distress Cardiovascular: RRR, S1/S2 +, no rubs, no gallops Respiratory: CTA bilaterally, no wheezing, no rhonchi Abdominal: Soft, NT, ND, bowel sounds + Extremities: no edema, no cyanosis, swollen/tender base of left great toe    The results of  significant diagnostics from this hospitalization (including imaging, microbiology, ancillary and laboratory) are listed below for reference.     Microbiology: No results found for this or any previous visit (from the past 240 hours).   Labs: BNP (last 3 results) No results for input(s): "BNP" in the last 8760 hours. Basic Metabolic Panel: Recent Labs  Lab 12/30/23 0822 12/30/23 1830 12/31/23 0546 01/01/24 0608  NA 133*  --  139 135  K 3.2*  --  3.8 3.9  CL 93*  --  102 103  CO2 22  --  24 24  GLUCOSE 167*  --  99 92  BUN 26*  --  21* 14  CREATININE 4.53*  --  1.44* 1.05  CALCIUM 10.0  --  8.6* 8.1*  MG 2.3 1.9  --   --   PHOS 2.6  --   --   --    Liver Function Tests: Recent Labs  Lab 12/30/23 0822 12/31/23 0546  AST 17 12*  ALT 17 11  ALKPHOS 65 45  BILITOT 1.2 1.1  PROT 9.2* 6.8  ALBUMIN 5.0 3.6   Recent Labs  Lab 12/30/23 0822  LIPASE 60*   No results for input(s): "AMMONIA" in the last 168 hours. CBC: Recent Labs  Lab 12/30/23 0822 12/31/23 0546 01/01/24 0608  WBC 21.5* 13.7* 10.8*  HGB 17.8* 14.0 12.6*  HCT 51.1 41.3 38.3*  MCV 92.1 94.3 96.5  PLT 453* 318 295   Cardiac Enzymes: No results for input(s): "CKTOTAL", "CKMB", "CKMBINDEX", "TROPONINI" in the last 168 hours. BNP: Invalid input(s): "POCBNP" CBG: No results for input(s): "GLUCAP" in the last 168 hours. D-Dimer No results for input(s): "DDIMER" in the last 72 hours. Hgb A1c No results for input(s): "HGBA1C" in the last 72 hours. Lipid Profile No results for input(s): "CHOL", "HDL", "LDLCALC", "TRIG", "CHOLHDL", "LDLDIRECT" in the last 72 hours. Thyroid function studies No results for input(s): "TSH", "T4TOTAL", "T3FREE", "THYROIDAB" in the last 72 hours.  Invalid input(s): "FREET3" Anemia work up No results for input(s): "VITAMINB12", "FOLATE", "FERRITIN", "TIBC", "IRON", "RETICCTPCT" in the last 72 hours. Urinalysis    Component Value Date/Time   COLORURINE AMBER (A)  12/30/2023 1333   APPEARANCEUR CLOUDY (A) 12/30/2023 1333   LABSPEC 1.027 12/30/2023 1333   PHURINE 5.0 12/30/2023 1333   GLUCOSEU NEGATIVE 12/30/2023 1333   HGBUR NEGATIVE 12/30/2023 1333   BILIRUBINUR SMALL (A) 12/30/2023 1333   KETONESUR NEGATIVE 12/30/2023 1333   PROTEINUR 100 (A) 12/30/2023 1333   NITRITE NEGATIVE 12/30/2023 1333   LEUKOCYTESUR TRACE (A) 12/30/2023 1333   Sepsis Labs Recent Labs  Lab 12/30/23 0822 12/31/23 0546 01/01/24 0608  WBC 21.5* 13.7* 10.8*   Microbiology No results found for this or any previous visit (  from the past 240 hours).  Please note: You were cared for by a hospitalist during your hospital stay. Once you are discharged, your primary care physician will handle any further medical issues. Please note that NO REFILLS for any discharge medications will be authorized once you are discharged, as it is imperative that you return to your primary care physician (or establish a relationship with a primary care physician if you do not have one) for your post hospital discharge needs so that they can reassess your need for medications and monitor your lab values.    Time coordinating discharge: 40 minutes  SIGNED:   Burnadette Pop, MD  [facility] 01/01/2024, 10:42 AM Pager [phone]  If 7PM-7AM, please contact night-coverage [email or web address] Password [email or web address]

## 2024-01-02 ENCOUNTER — Telehealth: Payer: Self-pay

## 2024-01-02 NOTE — Transitions of Care (Post Inpatient/ED Visit) (Signed)
   01/02/2024  Name: Victor Johnson MRN: [id number] DOB: May 03, 1976  Today's TOC FU Call Status: Today's TOC FU Call Status:: Successful TOC FU Call Completed TOC FU Call Complete Date: 01/02/24 Patient's Name and Date of Birth confirmed.  Transition Care Management Follow-up Telephone Call Date of Discharge: 01/01/24 Discharge Facility: [facility]) Type of Discharge: Inpatient Admission Primary Inpatient Discharge Diagnosis:: AKI How have you been since you were released from the hospital?: Better Any questions or concerns?: No  Items Reviewed: Did you receive and understand the discharge instructions provided?: Yes Medications obtained,verified, and reconciled?: No Medications Not Reviewed Reasons:: Other: (He said he has all of his medications and did not have any questions about the med regime. He was driving at the time of this call.) Any new allergies since your discharge?: No Dietary orders reviewed?: Yes Type of Diet Ordered:: soft/low fiber diet for 2-3 days.  He stated he understands why he needs to be on this diet; but stated that he is a grown man and that diet is not enough food for him. Do you have support at home?: Yes People in Home: spouse Name of Support/Comfort Primary Source: his wife and children.  Medications Reviewed Today: Medications Reviewed Today   Medications were not reviewed in this encounter     Home Care and Equipment/Supplies: Were Home Health Services Ordered?: No Any new equipment or medical supplies ordered?: No  Functional Questionnaire: Do you need assistance with bathing/showering or dressing?: No Do you need assistance with meal preparation?: No Do you need assistance with eating?: No Do you have difficulty maintaining continence: No Do you need assistance with getting out of bed/getting out of a chair/moving?: No Do you have difficulty managing or taking your medications?: No  Follow up appointments reviewed: PCP Follow-up  appointment confirmed?: No MD Provider Line Number:[phone] Given: No (He said he will call to schedule an appointment because he was driving at the time of this call.) Specialist Hospital Follow-up appointment confirmed?: Yes Date of Specialist follow-up appointment?: 02/18/24 Follow-Up Specialty Provider:: [facility]- physiatry Do you need transportation to your follow-up appointment?: No Do you understand care options if your condition(s) worsen?: Yes-patient verbalized understanding    SIGNATURE Robyne Peers, RN

## 2024-01-09 ENCOUNTER — Encounter: Payer: Self-pay | Admitting: Physical Therapy

## 2024-01-09 ENCOUNTER — Other Ambulatory Visit: Payer: Self-pay

## 2024-01-09 ENCOUNTER — Ambulatory Visit: Payer: Medicaid Other | Attending: Primary Care | Admitting: Physical Therapy

## 2024-01-09 DIAGNOSIS — M25512 Pain in left shoulder: Secondary | ICD-10-CM | POA: Insufficient documentation

## 2024-01-09 DIAGNOSIS — G8929 Other chronic pain: Secondary | ICD-10-CM | POA: Diagnosis not present

## 2024-01-09 DIAGNOSIS — M79661 Pain in right lower leg: Secondary | ICD-10-CM | POA: Insufficient documentation

## 2024-01-09 DIAGNOSIS — M542 Cervicalgia: Secondary | ICD-10-CM | POA: Diagnosis present

## 2024-01-09 DIAGNOSIS — M5459 Other low back pain: Secondary | ICD-10-CM | POA: Diagnosis present

## 2024-01-09 DIAGNOSIS — M6281 Muscle weakness (generalized): Secondary | ICD-10-CM | POA: Diagnosis not present

## 2024-01-09 NOTE — Patient Instructions (Signed)
 Access Code: [email or web address] URL: [email or web address] Date: 01/09/2024 Prepared by: Rosana Hoes  Exercises - Supine Lower Trunk Rotation  - 2 x daily - 10 reps - 5 seconds hold - Hooklying Single Knee to Chest Stretch  - 2 x daily - 3 reps - 20 seconds hold - Supine Cervical Retraction with Towel  - 2 x daily - 10 reps - 5 seconds hold - Sidelying Thoracic Lumbar Rotation  - 2 x daily - 10 reps - 5 seconds hold - Standing Gastroc Stretch at Counter  - 2 x daily - 3 reps - 30 seconds hold - Standing shoulder flexion wall slides  - 2 x daily - 10 reps - 5 seconds hold
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
## 2024-01-13 ENCOUNTER — Telehealth (INDEPENDENT_AMBULATORY_CARE_PROVIDER_SITE_OTHER): Payer: Self-pay | Admitting: Primary Care

## 2024-01-13 NOTE — Telephone Encounter (Signed)
 Spoke to pt about appt.. Will be present

## 2024-01-14 ENCOUNTER — Other Ambulatory Visit (INDEPENDENT_AMBULATORY_CARE_PROVIDER_SITE_OTHER): Payer: Self-pay | Admitting: Primary Care

## 2024-01-14 ENCOUNTER — Ambulatory Visit (INDEPENDENT_AMBULATORY_CARE_PROVIDER_SITE_OTHER): Admitting: Primary Care

## 2024-01-14 ENCOUNTER — Encounter (INDEPENDENT_AMBULATORY_CARE_PROVIDER_SITE_OTHER): Payer: Self-pay | Admitting: Primary Care

## 2024-01-14 VITALS — BP 130/88 | HR 91 | Wt 146.2 lb

## 2024-01-14 DIAGNOSIS — M1A9XX0 Chronic gout, unspecified, without tophus (tophi): Secondary | ICD-10-CM

## 2024-01-14 DIAGNOSIS — Z09 Encounter for follow-up examination after completed treatment for conditions other than malignant neoplasm: Secondary | ICD-10-CM

## 2024-01-14 DIAGNOSIS — M62838 Other muscle spasm: Secondary | ICD-10-CM

## 2024-01-14 DIAGNOSIS — M25531 Pain in right wrist: Secondary | ICD-10-CM

## 2024-01-14 MED ORDER — ALLOPURINOL 100 MG PO TABS
100.0000 mg | ORAL_TABLET | Freq: Every day | ORAL | 6 refills | Status: AC
Start: 2024-01-14 — End: ?

## 2024-01-14 NOTE — Telephone Encounter (Signed)
 Will forward to provider

## 2024-01-14 NOTE — Patient Instructions (Signed)
 Gout  Gout is painful swelling of your joints. Gout is a type of arthritis. It is caused by having too much uric acid in your body. Uric acid is a chemical that is made when your body breaks down substances called purines. If your body has too much uric acid, sharp crystals can form and build up in your joints. This causes pain and swelling. Gout attacks can happen quickly and be very painful (acute gout). Over time, the attacks can affect more joints and happen more often (chronic gout). What are the causes? Gout is caused by too much uric acid in your blood. This can happen because: Your kidneys do not remove enough uric acid from your blood. Your body makes too much uric acid. You eat too many foods that are high in purines. These foods include organ meats, some seafood, and beer. Trauma or stress can bring on an attack. What increases the risk? Having a family history of gout. Being male and middle-aged. Being male and having gone through menopause. Having an organ transplant. Taking certain medicines. Having certain conditions, such as: Being very overweight (obese). Lead poisoning. Kidney disease. A skin condition called psoriasis. Other risks include: Losing weight too quickly. Not having enough water in the body (being dehydrated). Drinking alcohol, especially beer. Drinking beverages that are sweetened with a type of sugar called fructose. What are the signs or symptoms? An attack of acute gout often starts at night and usually happens in just one joint. The most common place is the big toe. Other joints that may be affected include joints of the feet, ankle, knee, fingers, wrist, or elbow. Symptoms may include: Very bad pain. Warmth. Swelling. Stiffness. Tenderness. The affected joint may be very painful to touch. Shiny, red, or purple skin. Chills and fever. Chronic gout may cause symptoms more often. More joints may be involved. You may also have white or yellow lumps  (tophi) on your hands or feet or in other areas near your joints. How is this treated? Treatment for an acute attack may include medicines for pain and swelling, such as: NSAIDs, such as ibuprofen. Steroids taken by mouth or injected into a joint. Colchicine. This can be given by mouth or through an IV tube. Treatment to prevent future attacks may include: Taking small doses of NSAIDs or colchicine daily. Using a medicine that reduces uric acid levels in your blood, such as allopurinol. Making changes to your diet. You may need to see a food expert (dietitian) about what to eat and drink to prevent gout. Follow these instructions at home: During a gout attack  If told, put ice on the painful area. To do this: Put ice in a plastic bag. Place a towel between your skin and the bag. Leave the ice on for 20 minutes, 2-3 times a day. Take off the ice if your skin turns bright red. This is very important. If you cannot feel pain, heat, or cold, you have a greater risk of damage to the area. Raise the painful joint above the level of your heart as often as you can. Rest the joint as much as possible. If the joint is in your leg, you may be given crutches. Follow instructions from your doctor about what you cannot eat or drink. Avoiding future gout attacks Eat a low-purine diet. Avoid foods and drinks such as: Liver. Kidney. Anchovies. Asparagus. Herring. Mushrooms. Mussels. Beer. Stay at a healthy weight. If you want to lose weight, talk with your doctor. Do not  lose weight too fast. Start or continue an exercise plan as told by your doctor. Eating and drinking Avoid drinks sweetened by fructose. Drink enough fluids to keep your pee (urine) pale yellow. If you drink alcohol: Limit how much you have to: 0-1 drink a day for women who are not pregnant. 0-2 drinks a day for men. Know how much alcohol is in a drink. In the [place] one drink equals one 12 oz bottle of beer (355 mL), one 5 oz  glass of wine (148 mL), or one 1 oz glass of hard liquor (44 mL). General instructions Take over-the-counter and prescription medicines only as told by your doctor. Ask your doctor if you should avoid driving or using machines while you are taking your medicine. Return to your normal activities when your doctor says that it is safe. Keep all follow-up visits. Where to find more information [employer]: www.niams.http://www.myers.net/ Contact a doctor if: You have another gout attack. You still have symptoms of a gout attack after 10 days of treatment. You have problems (side effects) because of your medicines. You have chills or a fever. You have burning pain when you pee (urinate). You have pain in your lower back or belly. Get help right away if: You have very bad pain. Your pain cannot be controlled. You cannot pee. Summary Gout is painful swelling of the joints. The most common site of pain is the big toe, but it can affect other joints. Medicines and avoiding some foods can help to prevent and treat gout attacks. This information is not intended to replace advice given to you by your health care provider. Make sure you discuss any questions you have with your health care provider. Document Revised: 07/26/2021 Document Reviewed: 07/26/2021 Elsevier Patient Education  2024 Elsevier Inc.Preventing Hypertension Hypertension, also called high blood pressure, is when the force of blood pumping through the arteries is too strong. Arteries are blood vessels that carry blood from the heart throughout the body. Often, hypertension does not cause symptoms until blood pressure is very high. It is important to have your blood pressure checked regularly. Diet and lifestyle changes can help you prevent hypertension, and they may make you feel better overall and improve your quality of life. If you already have hypertension, you may control it with diet and lifestyle changes, as well as with  medicine. How can this condition affect me? Over time, hypertension can damage the arteries and decrease blood flow to important parts of the body, including the brain, heart, and kidneys. By keeping your blood pressure in a healthy range, you can help prevent complications like heart attack, heart failure, stroke, kidney failure, and vascular dementia. What can increase my risk? An unhealthy diet and a lack of physical activity can make you more likely to develop high blood pressure. Some other risk factors include: Age. The risk increases with age. Having family members who have had high blood pressure. Having certain health conditions, such as thyroid problems. Being overweight or obese. Drinking too much alcohol or caffeine. Having too much fat, sugar, calories, or salt (sodium) in your diet. Smoking or using illegal drugs. Taking certain medicines, such as antidepressants, decongestants, birth control pills, and NSAIDs, such as ibuprofen. What actions can I take to prevent or manage this condition? Work with your health care provider to make a hypertension prevention plan that works for you. You may be referred for counseling on a healthy diet and physical activity. Follow your plan and keep all  follow-up visits. Diet changes Maintain a healthy diet. This includes: Eating less salt (sodium). Ask your health care provider how much sodium is safe for you to have. The general recommendation is to have less than 1 tsp (2,300 mg) of sodium a day. Do not add salt to your food. Choose low-sodium options when grocery shopping and eating out. Limiting fats in your diet. You can do this by eating low-fat or fat-free dairy products and by eating less red meat. Eating more fruits, vegetables, and whole grains. Make a goal to eat: 1-2 cups of fresh fruits and vegetables each day. 3-4 servings of whole grains each day. Avoiding foods and beverages that have added sugars. Eating fish that contain  healthy fats (omega-3 fatty acids), such as mackerel or salmon. If you need help putting together a healthy eating plan, try the DASH diet. This diet is high in fruits, vegetables, and whole grains. It is low in sodium, red meat, and added sugars. DASH stands for Dietary Approaches to Stop Hypertension. Lifestyle changes  Lose weight if you are overweight. Losing just 3-5% of your body weight can help prevent or control hypertension. For example, if your present weight is 200 lb (91 kg), a loss of 3-5% of your weight means losing 6-10 lb (2.7-4.5 kg). Ask your health care provider to help you with a diet and exercise plan to safely lose weight. Get enough exercise. Do at least 150 minutes of moderate-intensity exercise each week. You could do this in short exercise sessions several times a day, or you could do longer exercise sessions a few times a week. For example, you could take a brisk 10-minute walk or bike ride, 3 times a day, for 5 days a week. Find ways to reduce stress, such as exercising, meditating, listening to music, or taking a yoga class. If you need help reducing stress, ask your health care provider. Do not use any products that contain nicotine or tobacco. These products include cigarettes, chewing tobacco, and vaping devices, such as e-cigarettes. Chemicals in tobacco and nicotine products raise your blood pressure each time you use them. If you need help quitting, ask your health care provider. Learn how to check your blood pressure at home. Make sure that you know your personal target blood pressure, as told by your health care provider. Try to sleep 7-9 hours per night. Alcohol use Do not drink alcohol if: Your health care provider tells you not to drink. You are pregnant, may be pregnant, or are planning to become pregnant. If you drink alcohol: Limit how much you have to: 0-1 drink a day for women. 0-2 drinks a day for men. Know how much alcohol is in your drink. In the  [place] one drink equals one 12 oz bottle of beer (355 mL), one 5 oz glass of wine (148 mL), or one 1 oz glass of hard liquor (44 mL). Medicines In addition to diet and lifestyle changes, your health care provider may recommend medicines to help lower your blood pressure. In general: You may need to try a few different medicines to find what works best for you. You may need to take more than one medicine. Take over-the-counter and prescription medicines only as told by your health care provider. Questions to ask your health care provider What is my blood pressure goal? How can I lower my risk for high blood pressure? How should I monitor my blood pressure at home? Where to find support Your health care provider can help you  prevent hypertension and help you keep your blood pressure at a healthy level. Your local hospital or your community may also provide support services and prevention programs. The American Heart Association offers an online support network at supportnetwork.heart.org Where to find more information Learn more about hypertension from: National Heart, Lung, and Blood Institute: PopSteam.is Centers for Disease Control and Prevention: FootballExhibition.com.br American Academy of Family Physicians: familydoctor.org Learn more about the DASH diet from: National Heart, Lung, and Blood Institute: PopSteam.is Contact a health care provider if: You think you are having a reaction to medicines you have taken. You have recurrent headaches or feel dizzy. You have swelling in your ankles. You have trouble with your vision. Get help right away if: You have sudden, severe chest, back, or abdominal pain or discomfort. You have shortness of breath. You have a sudden, severe headache. These symptoms may be an emergency. Get help right away. Call 911. Do not wait to see if the symptoms will go away. Do not drive yourself to the hospital. Summary Hypertension often does not cause any  symptoms until blood pressure is very high. It is important to get your blood pressure checked regularly. Diet and lifestyle changes are important steps in preventing hypertension. By keeping your blood pressure in a healthy range, you may prevent complications like heart attack, heart failure, stroke, and kidney failure. Work with your health care provider to make a hypertension prevention plan that works for you. This information is not intended to replace advice given to you by your health care provider. Make sure you discuss any questions you have with your health care provider. Document Revised: 08/10/2021 Document Reviewed: 08/10/2021 Elsevier Patient Education  2024 [employer].
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
## 2024-01-29 ENCOUNTER — Encounter (HOSPITAL_COMMUNITY): Payer: Self-pay | Admitting: Internal Medicine

## 2024-01-29 ENCOUNTER — Emergency Department (HOSPITAL_COMMUNITY)

## 2024-01-29 ENCOUNTER — Other Ambulatory Visit: Payer: Self-pay

## 2024-01-29 ENCOUNTER — Inpatient Hospital Stay (HOSPITAL_COMMUNITY)
Admission: EM | Admit: 2024-01-29 | Discharge: 2024-01-31 | DRG: 392 | Disposition: A | Discharge status: Home / Self Care | Attending: Family Medicine | Admitting: Family Medicine

## 2024-01-29 DIAGNOSIS — K5730 Diverticulosis of large intestine without perforation or abscess without bleeding: Secondary | ICD-10-CM | POA: Diagnosis present

## 2024-01-29 DIAGNOSIS — I10 Essential (primary) hypertension: Secondary | ICD-10-CM | POA: Diagnosis not present

## 2024-01-29 DIAGNOSIS — F17210 Nicotine dependence, cigarettes, uncomplicated: Secondary | ICD-10-CM | POA: Diagnosis present

## 2024-01-29 DIAGNOSIS — R112 Nausea with vomiting, unspecified: Secondary | ICD-10-CM | POA: Diagnosis not present

## 2024-01-29 DIAGNOSIS — F1210 Cannabis abuse, uncomplicated: Secondary | ICD-10-CM | POA: Diagnosis present

## 2024-01-29 DIAGNOSIS — E876 Hypokalemia: Secondary | ICD-10-CM | POA: Diagnosis present

## 2024-01-29 DIAGNOSIS — Z79899 Other long term (current) drug therapy: Secondary | ICD-10-CM

## 2024-01-29 DIAGNOSIS — R1110 Vomiting, unspecified: Principal | ICD-10-CM

## 2024-01-29 DIAGNOSIS — Z1152 Encounter for screening for COVID-19: Secondary | ICD-10-CM

## 2024-01-29 DIAGNOSIS — M109 Gout, unspecified: Secondary | ICD-10-CM | POA: Diagnosis present

## 2024-01-29 DIAGNOSIS — K2970 Gastritis, unspecified, without bleeding: Secondary | ICD-10-CM | POA: Diagnosis present

## 2024-01-29 LAB — LIPASE, BLOOD: Lipase: 52 U/L — ABNORMAL HIGH (ref 11–51)

## 2024-01-29 LAB — URINALYSIS, ROUTINE W REFLEX MICROSCOPIC
Bacteria, UA: NONE SEEN
Glucose, UA: NEGATIVE mg/dL
Hgb urine dipstick: NEGATIVE
Ketones, ur: NEGATIVE mg/dL
Leukocytes,Ua: NEGATIVE
Nitrite: NEGATIVE
Protein, ur: >=300 mg/dL — AB
Specific Gravity, Urine: 1.037 — ABNORMAL HIGH (ref 1.005–1.030)
pH: 5 (ref 5.0–8.0)

## 2024-01-29 LAB — RAPID URINE DRUG SCREEN, HOSP PERFORMED
Amphetamines: NOT DETECTED
Barbiturates: NOT DETECTED
Benzodiazepines: NOT DETECTED
Cocaine: NOT DETECTED
Opiates: NOT DETECTED
Tetrahydrocannabinol: POSITIVE — AB

## 2024-01-29 LAB — CBC
HCT: 46.2 % (ref 39.0–52.0)
Hemoglobin: 15.4 g/dL (ref 13.0–17.0)
MCH: 31 pg (ref 26.0–34.0)
MCHC: 33.3 g/dL (ref 30.0–36.0)
MCV: 93.1 fL (ref 80.0–100.0)
Platelets: 333 10*3/uL (ref 150–400)
RBC: 4.96 MIL/uL (ref 4.22–5.81)
RDW: 13.4 % (ref 11.5–15.5)
WBC: 9.6 10*3/uL (ref 4.0–10.5)
nRBC: 0 % (ref 0.0–0.2)

## 2024-01-29 LAB — COMPREHENSIVE METABOLIC PANEL
ALT: 14 U/L (ref 0–44)
AST: 14 U/L — ABNORMAL LOW (ref 15–41)
Albumin: 4.8 g/dL (ref 3.5–5.0)
Alkaline Phosphatase: 60 U/L (ref 38–126)
Anion gap: 15 (ref 5–15)
BUN: 14 mg/dL (ref 6–20)
CO2: 26 mmol/L (ref 22–32)
Calcium: 9.6 mg/dL (ref 8.9–10.3)
Chloride: 98 mmol/L (ref 98–111)
Creatinine, Ser: 1.01 mg/dL (ref 0.61–1.24)
GFR, Estimated: >60 mL/min (ref >60)
Glucose, Bld: 129 mg/dL — ABNORMAL HIGH (ref 70–99)
Potassium: 2.7 mmol/L — CL (ref 3.5–5.1)
Sodium: 139 mmol/L (ref 135–145)
Total Bilirubin: 1.8 mg/dL — ABNORMAL HIGH (ref 0.0–1.2)
Total Protein: 8.6 g/dL — ABNORMAL HIGH (ref 6.5–8.1)

## 2024-01-29 LAB — MAGNESIUM: Magnesium: 2 mg/dL (ref 1.7–2.4)

## 2024-01-29 MED ORDER — POTASSIUM CHLORIDE CRYS ER 20 MEQ PO TBCR
40.0000 meq | EXTENDED_RELEASE_TABLET | Freq: Once | ORAL | Status: DC
Start: 2024-01-29 — End: 2024-01-29
  Filled 2024-01-29: qty 2

## 2024-01-29 MED ORDER — LACTATED RINGERS IV SOLN: INTRAVENOUS | Status: AC

## 2024-01-29 MED ORDER — METOCLOPRAMIDE HCL 5 MG/ML IJ SOLN
10.0000 mg | Freq: Three times a day (TID) | INTRAMUSCULAR | Status: DC
  Administered 2024-01-29 – 2024-01-31 (×5): 10 mg via INTRAVENOUS
  Filled 2024-01-29 (×4): qty 2

## 2024-01-29 MED ORDER — CAPSAICIN 0.025 % EX CREA
TOPICAL_CREAM | Freq: Once | CUTANEOUS | Status: AC
  Filled 2024-01-29: qty 60

## 2024-01-29 MED ORDER — SODIUM CHLORIDE 0.9 % IV SOLN
12.5000 mg | Freq: Once | INTRAVENOUS | Status: AC
  Administered 2024-01-29: 12.5 mg via INTRAVENOUS
  Filled 2024-01-29: qty 12.5

## 2024-01-29 MED ORDER — SENNOSIDES-DOCUSATE SODIUM 8.6-50 MG PO TABS: 1.0000 | ORAL_TABLET | Freq: Every evening | ORAL | Status: DC | PRN

## 2024-01-29 MED ORDER — LACTATED RINGERS IV BOLUS
1000.0000 mL | Freq: Once | INTRAVENOUS | Status: AC
  Administered 2024-01-29: 1000 mL via INTRAVENOUS

## 2024-01-29 MED ORDER — ONDANSETRON HCL 4 MG/2ML IJ SOLN
4.0000 mg | Freq: Once | INTRAMUSCULAR | Status: AC
  Administered 2024-01-29: 4 mg via INTRAVENOUS
  Filled 2024-01-29: qty 2

## 2024-01-29 MED ORDER — ENOXAPARIN SODIUM 40 MG/0.4ML IJ SOSY
40.0000 mg | PREFILLED_SYRINGE | INTRAMUSCULAR | Status: DC
  Administered 2024-01-30: 40 mg via SUBCUTANEOUS
  Filled 2024-01-29: qty 0.4

## 2024-01-29 MED ORDER — POTASSIUM CHLORIDE 10 MEQ/100ML IV SOLN
10.0000 meq | INTRAVENOUS | Status: AC
  Administered 2024-01-29 (×3): 10 meq via INTRAVENOUS
  Filled 2024-01-29 (×3): qty 100

## 2024-01-29 MED ORDER — LORAZEPAM 2 MG/ML IJ SOLN
1.0000 mg | Freq: Once | INTRAMUSCULAR | Status: AC
  Administered 2024-01-29: 1 mg via INTRAVENOUS
  Filled 2024-01-29: qty 1

## 2024-01-29 MED ORDER — ACETAMINOPHEN 325 MG PO TABS
650.0000 mg | ORAL_TABLET | Freq: Four times a day (QID) | ORAL | Status: DC | PRN
  Administered 2024-01-30: 650 mg via ORAL
  Filled 2024-01-29: qty 2

## 2024-01-29 MED ORDER — ALLOPURINOL 100 MG PO TABS
100.0000 mg | ORAL_TABLET | Freq: Every day | ORAL | Status: DC
  Administered 2024-01-30 – 2024-01-31 (×2): 100 mg via ORAL
  Filled 2024-01-29 (×2): qty 1

## 2024-01-29 MED ORDER — ONDANSETRON HCL 4 MG/2ML IJ SOLN
4.0000 mg | Freq: Four times a day (QID) | INTRAMUSCULAR | Status: DC | PRN
  Administered 2024-01-30: 4 mg via INTRAVENOUS
  Filled 2024-01-29: qty 2

## 2024-01-29 MED ORDER — IOHEXOL 300 MG/ML  SOLN
100.0000 mL | Freq: Once | INTRAMUSCULAR | Status: AC | PRN
  Administered 2024-01-29: 100 mL via INTRAVENOUS

## 2024-01-29 MED ORDER — ACETAMINOPHEN 650 MG RE SUPP: 650.0000 mg | Freq: Four times a day (QID) | RECTAL | Status: DC | PRN

## 2024-01-29 MED ORDER — SODIUM CHLORIDE 0.9% FLUSH
3.0000 mL | Freq: Two times a day (BID) | INTRAVENOUS | Status: DC
  Administered 2024-01-29 – 2024-01-31 (×4): 3 mL via INTRAVENOUS

## 2024-01-29 MED ORDER — ONDANSETRON HCL 4 MG PO TABS: 4.0000 mg | ORAL_TABLET | Freq: Four times a day (QID) | ORAL | Status: DC | PRN

## 2024-01-29 NOTE — Hospital Course (Signed)
 Victor Johnson is a 48 y.o. male with medical history significant for gastritis, gout, tobacco and marijuana use who is admitted with intractable nausea and vomiting.

## 2024-01-29 NOTE — ED Triage Notes (Signed)
 Pt reports nausea/vomiting since Monday morning. Says this happens every few weeks. Last marijuana use Sunday night. Been able to keep some fluids down

## 2024-01-29 NOTE — ED Provider Notes (Signed)
 Patient signed out to me with ongoing nausea and vomiting.  Gastritis hyperemesis marijuana syndrome possibly in the past this seems like.  He has been given Ativan potassium repletion IV fluids with some improvement but still unable to keep things down.  Still feeling nauseous.  He has been given Zofran.  Rechecked EKG QTc is 470.  Will give him a dose of Phenergan and get a CT scan of the abdomen pelvis and reevaluate.  CT scans unremarkable.  Patient still not feeling much better and I think it is reasonable to observe him overnight continue hydration and potassium repletion IV nausea medication and make sure he can advance his diet.  He is high risk to bounce back.  This chart was dictated using voice recognition software.  Despite best efforts to proofread,  errors can occur which can change the documentation meaning.    Virgina Norfolk, DO 01/29/24 1836

## 2024-01-29 NOTE — ED Notes (Signed)
 Patient transported to CT

## 2024-01-29 NOTE — H&P (Signed)
 History and Physical    Victor Johnson NWG:[id number] DOB: 1976/08/25 DOA: 01/29/2024  PCP: Grayce Sessions, NP  Patient coming from: Home  I have personally briefly reviewed patient's old medical records in [facility]  Chief Complaint: Nausea and vomiting  HPI: Victor Johnson is a 48 y.o. male with medical history significant for gastritis, gout, tobacco and marijuana use who presented to the ED for evaluation of nausea vomiting.  Patient states that he has had intractable nausea and vomiting since Monday (3/24).  He has not had any hematemesis.  He reports intermittent cold sweats but denies fevers.  He has not had any abdominal pain.  Denies loose stools or diarrhea.  He has not been able to maintain adequate oral intake during this time.  He denies any recent medication changes.  Patient reports smoking about 1 pack of cigarettes daily.  He also reports regular marijuana use.  He reports occasional alcohol use.  ED Course  Labs/Imaging on admission: I have personally reviewed following labs and imaging studies.  Initial vitals showed BP 153/116, pulse 88, RR 16, temp 98.0 F, SpO2 95% on room air.  Labs showed sodium 139, potassium 2.7, magnesium 2.0, bicarb 26, BUN 14, creatinine 1.01, serum glucose 129, lipase 52, WBC 9.6, hemoglobin 15.4, platelets 333,000.  UA negative for UTI.  CT abdomen/pelvis with contrast negative for acute intra-abdominal or pelvic pathology.  Colonic diverticulosis noted.  No bowel obstruction.  Normal-appearing appendix.  Patient was given 2 L LR, IV Ativan 1 mg, IV Zofran, IV Phenergan, topical capsaicin cream.  IV K 10 mEq x 3.  The hospitalist service was consulted to admit.  Review of Systems: All systems reviewed and are negative except as documented in history of present illness above.   Past Medical History:  Diagnosis Date   Gastritis    Hypertension    Polyp of intestine     No past surgical history on file.  Social  History: Patient reports smoking 1 pack cigarettes daily.  He reports marijuana use.  He reports occasional alcohol use.  No Known Allergies  No family history on file.   Prior to Admission medications   Medication Sig Start Date End Date Taking? Authorizing Provider  acetaminophen (TYLENOL) 500 MG tablet Take 1 tablet (500 mg total) by mouth every 6 (six) hours as needed. Patient taking differently: Take 500 mg by mouth every 6 (six) hours as needed (for headaches). 12/07/23   Elmer Picker, NP  allopurinol (ZYLOPRIM) 100 MG tablet Take 1 tablet (100 mg total) by mouth daily. 01/14/24   Grayce Sessions, NP  colchicine 0.6 MG tablet Take 1 tablet (0.6 mg total) by mouth 2 (two) times daily for 7 days. 01/01/24 01/09/24  Burnadette Pop, MD  cyclobenzaprine (FLEXERIL) 10 MG tablet TAKE 1 TABLET BY MOUTH TWICE DAILY AS NEEDED FOR MUSCLE SPASM 01/19/24   Grayce Sessions, NP  ondansetron (ZOFRAN) 4 MG tablet Take 4 mg by mouth daily. 12/16/23   [provider]  oxyCODONE (OXY IR/ROXICODONE) 5 MG immediate release tablet Take 1 tablet (5 mg total) by mouth every 6 (six) hours as needed for moderate pain (pain score 4-6). 01/01/24   Burnadette Pop, MD  pantoprazole (PROTONIX) 40 MG tablet Take 1 tablet (40 mg total) by mouth 2 (two) times daily before a meal. 05/02/23 12/30/23  Hongalgi, Maximino Greenland, MD  sucralfate (CARAFATE) 1 g tablet Mix 1 tablet in 10 ml of H2O and drink 4 (four) times daily -  with meals and at bedtime for 14 days. Patient not taking: Reported on 12/30/2023 05/02/23 12/07/23  Elease Etienne, MD  sucralfate (CARAFATE) 1 g tablet Take 1 g by mouth 2 (two) times daily.    [provider]    Physical Exam: Vitals:   01/29/24 1212 01/29/24 1515 01/29/24 1913 01/29/24 1915  BP: (!) 147/103 (!) 125/102  (!) 159/117  Pulse: 80 78 80 78  Resp: 15 14 17 18   Temp: 98.7 F (37.1 C) 98.7 F (37.1 C) 98.4 F (36.9 C) 98.4 F (36.9 C)  TempSrc: Oral  Oral   SpO2: 97%  98%  99%  Weight:      Height:       Constitutional: Resting in bed, no acute distress Eyes: EOMI, lids and conjunctivae normal ENMT: Mucous membranes are moist. Posterior pharynx clear of any exudate or lesions.Normal dentition.  Neck: normal, supple, no masses. Respiratory: clear to auscultation bilaterally, no wheezing, no crackles. Normal respiratory effort. No accessory muscle use.  Cardiovascular: Regular rate and rhythm, no murmurs / rubs / gallops. No extremity edema. 2+ pedal pulses. Abdomen: no tenderness, no masses palpated. Musculoskeletal: no clubbing / cyanosis. No joint deformity upper and lower extremities. Good ROM, no contractures. Normal muscle tone.  Skin: no rashes, lesions, ulcers. No induration Neurologic: Sensation intact. Strength 5/5 in all 4.  Psychiatric:  Alert and oriented x 3. Normal mood.   EKG: Personally reviewed. Sinus rhythm, rate 80, QTc 473.  Assessment/Plan Principal Problem:   Intractable nausea and vomiting Active Problems:   Hypokalemia   Victor Johnson is a 48 y.o. male with medical history significant for gastritis, gout, tobacco and marijuana use who is admitted with intractable nausea and vomiting.  Assessment and Plan: Intractable nausea vomiting: Persistent symptoms despite initial management in ED.  CT A/P negative for acute etiology.  Potentially cannabinoid hyperemesis syndrome. -Start scheduled IV Reglan 10 mg every 8 hours next-IV Zofran as needed -Continue IV fluid hydration overnight until tolerating orals -Advance diet as tolerated  Hypokalemia: IV repletion ordered.  Tobacco and marijuana use: Patient reports smoking 1 pack/day.  He declines nicotine patch.  He also reports marijuana use. Gout: Continue allopurinol when tolerating orals.    DVT prophylaxis: enoxaparin (LOVENOX) injection 40 mg Start: 01/29/24 2200 Code Status: Full code, confirmed with patient on admission Family Communication: Discussed with  patient, he has discussed with family Disposition Plan: From home and likely discharge to home pending clinical progress Consults called: None Severity of Illness: The appropriate patient status for this patient is OBSERVATION. Observation status is judged to be reasonable and necessary in order to provide the required intensity of service to ensure the patient's safety. The patient's presenting symptoms, physical exam findings, and initial radiographic and laboratory data in the context of their medical condition is felt to place them at decreased risk for further clinical deterioration. Furthermore, it is anticipated that the patient will be medically stable for discharge from the hospital within 2 midnights of admission.   Victor Mclean MD [facility]  If 7PM-7AM, please contact night-coverage [email or web address]  01/29/2024, 7:38 PM

## 2024-01-29 NOTE — ED Notes (Signed)
 Wife would like a phone call with CT results

## 2024-01-29 NOTE — ED Provider Notes (Signed)
 [facility] AT [facility] Provider Note   CSN: [id number] Arrival date & time: 01/29/24  0757     History  Chief Complaint  Patient presents with   Emesis    Victor Johnson is a 48 y.o. male.  48       48 year old male with history of hypertension, gastritis, history of diverticulitis, gout, admission for AKI in February, presents with concern for nausea and vomiting.  Reports his symptoms began on Monday.  Reports that he has been vomiting more than 10 times a day over the last 3 days.  He has been unable to keep anything down and has tried taking Gatorade, Pedialyte, water.  He has not had any diarrhea, reports he had no bowel movements because he is been unable to eat or drink much of anything.  He denies any abdominal pain, and reports that when he has these episodes he typically has left upper quadrant pain but today does not.  Denies any fever, urinary symptoms.  He regularly uses marijuana.  He denies any use of ibuprofen.  Past Medical History:  Diagnosis Date   Gastritis    Hypertension    Polyp of intestine      Home Medications Prior to Admission medications   Medication Sig Start Date End Date Taking? Authorizing Provider  acetaminophen (TYLENOL) 500 MG tablet Take 1 tablet (500 mg total) by mouth every 6 (six) hours as needed. Patient taking differently: Take 500 mg by mouth every 6 (six) hours as needed (for headaches). 12/07/23   Elmer Picker, NP  allopurinol (ZYLOPRIM) 100 MG tablet Take 1 tablet (100 mg total) by mouth daily. 01/14/24   Grayce Sessions, NP  colchicine 0.6 MG tablet Take 1 tablet (0.6 mg total) by mouth 2 (two) times daily for 7 days. 01/01/24 01/29/24  Burnadette Pop, MD  cyclobenzaprine (FLEXERIL) 10 MG tablet TAKE 1 TABLET BY MOUTH TWICE DAILY AS NEEDED FOR MUSCLE SPASM 01/19/24   Grayce Sessions, NP  ondansetron (ZOFRAN) 4 MG tablet Take 4 mg by mouth daily. 12/16/23   [provider]   oxyCODONE (OXY IR/ROXICODONE) 5 MG immediate release tablet Take 1 tablet (5 mg total) by mouth every 6 (six) hours as needed for moderate pain (pain score 4-6). 01/01/24   Burnadette Pop, MD  pantoprazole (PROTONIX) 40 MG tablet Take 1 tablet (40 mg total) by mouth 2 (two) times daily before a meal. 05/02/23 01/29/24  Hongalgi, Maximino Greenland, MD  sucralfate (CARAFATE) 1 g tablet Mix 1 tablet in 10 ml of H2O and drink 4 (four) times daily -  with meals and at bedtime for 14 days. 05/02/23 01/29/24  Hongalgi, Maximino Greenland, MD  sucralfate (CARAFATE) 1 g tablet Take 1 g by mouth 2 (two) times daily.    [provider]      Allergies    Patient has no known allergies.    Review of Systems   Review of Systems  Physical Exam Updated Vital Signs BP (!) 170/111 (BP Location: Right Arm)   Pulse 75   Temp 98.4 F (36.9 C) (Oral)   Resp 18   Ht 5\' 8"  (1.727 m)   Wt 65.8 kg   SpO2 100%   BMI 22.05 kg/m  Physical Exam Vitals and nursing note reviewed.  Constitutional:      General: He is not in acute distress.    Appearance: He is well-developed. He is not diaphoretic.  HENT:     Head: Normocephalic and  atraumatic.  Eyes:     Conjunctiva/sclera: Conjunctivae normal.  Cardiovascular:     Rate and Rhythm: Normal rate and regular rhythm.     Heart sounds: Normal heart sounds. No murmur heard.    No friction rub. No gallop.  Pulmonary:     Effort: Pulmonary effort is normal. No respiratory distress.     Breath sounds: Normal breath sounds. No wheezing or rales.  Abdominal:     General: There is no distension.     Palpations: Abdomen is soft.     Tenderness: There is no abdominal tenderness. There is no guarding.  Musculoskeletal:     Cervical back: Normal range of motion.  Skin:    General: Skin is warm and dry.  Neurological:     Mental Status: He is alert and oriented to person, place, and time.     ED Results / Procedures / Treatments   Labs (all labs ordered are listed, but only  abnormal results are displayed) Labs Reviewed  LIPASE, BLOOD - Abnormal; Notable for the following components:      Result Value   Lipase 52 (*)    All other components within normal limits  COMPREHENSIVE METABOLIC PANEL - Abnormal; Notable for the following components:   Potassium 2.7 (*)    Glucose, Bld 129 (*)    Total Protein 8.6 (*)    AST 14 (*)    Total Bilirubin 1.8 (*)    All other components within normal limits  URINALYSIS, ROUTINE W REFLEX MICROSCOPIC - Abnormal; Notable for the following components:   Color, Urine AMBER (*)    APPearance HAZY (*)    Specific Gravity, Urine 1.037 (*)    Bilirubin Urine SMALL (*)    Protein, ur >=300 (*)    All other components within normal limits  RAPID URINE DRUG SCREEN, HOSP PERFORMED - Abnormal; Notable for the following components:   Tetrahydrocannabinol POSITIVE (*)    All other components within normal limits  CBC  MAGNESIUM  BASIC METABOLIC PANEL  CBC    EKG EKG Interpretation Date/Time:  Wednesday January 29 2024 16:34:22 EDT Ventricular Rate:  80 PR Interval:  122 QRS Duration:  92 QT Interval:  410 QTC Calculation: 473 R Axis:   82  Text Interpretation: Sinus rhythm Consider left ventricular hypertrophy Confirmed by Virgina Norfolk [phone]) on 01/29/2024 4:56:26 PM  Radiology CT ABDOMEN PELVIS W CONTRAST Result Date: 01/29/2024 CLINICAL DATA:  Abdominal pain. EXAM: CT ABDOMEN AND PELVIS WITH CONTRAST TECHNIQUE: Multidetector CT imaging of the abdomen and pelvis was performed using the standard protocol following bolus administration of intravenous contrast. RADIATION DOSE REDUCTION: This exam was performed according to the departmental dose-optimization program which includes automated exposure control, adjustment of the mA and/or kV according to patient size and/or use of iterative reconstruction technique. CONTRAST:  OMNIPAQUE IOHEXOL 300 MG/ML  SOLN COMPARISON:  CT abdomen pelvis dated 12/30/2023. FINDINGS: Lower  chest: The visualized lung bases are clear. No intra-abdominal free air or free fluid. Hepatobiliary: The liver is unremarkable. No PVT tension. The gallbladder is unremarkable. Pancreas: Unremarkable. No pancreatic ductal dilatation or surrounding inflammatory changes. Spleen: Normal in size without focal abnormality. Adrenals/Urinary Tract: The adrenal glands unremarkable. The kidneys, visualized ureters, and urinary bladder appear unremarkable. Stomach/Bowel: There is sigmoid diverticulosis with muscular hypertrophy as well as scattered colonic diverticula. There is no bowel obstruction or active inflammation. The appendix is normal. Vascular/Lymphatic: The abdominal aorta and IVC unremarkable. No portal venous gas. There is no adenopathy.  Reproductive: The prostate and seminal vesicles are grossly remarkable. No pelvic mass Other: None Musculoskeletal: No acute or significant osseous findings. IMPRESSION: 1. No acute intra-abdominal or pelvic pathology. 2. Colonic diverticulosis. No bowel obstruction. Normal appendix. Electronically Signed   By: Elgie Collard M.D.   On: 01/29/2024 18:28    Procedures Procedures    Medications Ordered in ED Medications  metoCLOPramide (REGLAN) injection 10 mg (10 mg Intravenous Given 01/29/24 2101)  enoxaparin (LOVENOX) injection 40 mg (40 mg Subcutaneous Patient Refused/Not Given 01/29/24 2107)  sodium chloride flush (NS) 0.9 % injection 3 mL (3 mLs Intravenous Given 01/29/24 2108)  acetaminophen (TYLENOL) tablet 650 mg (has no administration in time range)    Or  acetaminophen (TYLENOL) suppository 650 mg (has no administration in time range)  lactated ringers infusion ( Intravenous New Bag/Given 01/29/24 1952)  senna-docusate (Senokot-S) tablet 1 tablet (has no administration in time range)  ondansetron (ZOFRAN) tablet 4 mg (has no administration in time range)    Or  ondansetron (ZOFRAN) injection 4 mg (has no administration in time range)  allopurinol  (ZYLOPRIM) tablet 100 mg (has no administration in time range)  lactated ringers bolus 1,000 mL (0 mLs Intravenous Stopped 01/29/24 1100)  LORazepam (ATIVAN) injection 1 mg (1 mg Intravenous Given 01/29/24 0949)  capsaicin (ZOSTRIX) 0.025 % cream ( Topical Given 01/29/24 0954)  potassium chloride 10 mEq in 100 mL IVPB (0 mEq Intravenous Stopped 01/29/24 1517)  ondansetron (ZOFRAN) injection 4 mg (4 mg Intravenous Given 01/29/24 1533)  lactated ringers bolus 1,000 mL (0 mLs Intravenous Stopped 01/29/24 1758)  promethazine (PHENERGAN) 12.5 mg in sodium chloride 0.9 % 50 mL IVPB (0 mg Intravenous Stopped 01/29/24 1823)  iohexol (OMNIPAQUE) 300 MG/ML solution 100 mL (100 mLs Intravenous Contrast Given 01/29/24 1722)  potassium chloride 10 mEq in 100 mL IVPB (10 mEq Intravenous New Bag/Given 01/29/24 2207)    ED Course/ Medical Decision Making/ A&P                                   48 year old male with history of hypertension, gastritis, history of diverticulitis, gout, admission for AKI in February, presents with concern for nausea and vomiting.  His exam is benign, he does not have abdominal pain, no distention, low suspicion for appendicitis, diverticulitis, cholecystitis, small bowel obstruction.  Symptoms may be secondary to contaminant hyperemesis, other gastroparesis, gastritis, viral syndrome.  Labs completed and personally about interpreted by me show a lipase very mildly elevated and not consistent with pancreatitis, hypokalemia with a potassium of 2.7, normal creatinine, no other clinically significant electrolyte abnormalities.  CBC with without leukocytosis or anemia.  Does have prolonged QTc on EKG.  Given IV fluids, Ativan and capsaicin cream with improvement initially then return of nausea and vomiting.  Given potassium for hypokalemia.  At this time after K replacement will give dose of zofran with continued cardiac monitoring and hydration and will repeat po challenge.          Final Clinical Impression(s) / ED Diagnoses Final diagnoses:  Hyperemesis    Rx / DC Orders ED Discharge Orders     None         Alvira Monday, MD 01/29/24 2314

## 2024-01-30 DIAGNOSIS — R112 Nausea with vomiting, unspecified: Secondary | ICD-10-CM | POA: Diagnosis present

## 2024-01-30 DIAGNOSIS — E876 Hypokalemia: Secondary | ICD-10-CM | POA: Diagnosis not present

## 2024-01-30 DIAGNOSIS — Z79899 Other long term (current) drug therapy: Secondary | ICD-10-CM | POA: Diagnosis not present

## 2024-01-30 DIAGNOSIS — K2970 Gastritis, unspecified, without bleeding: Secondary | ICD-10-CM | POA: Diagnosis present

## 2024-01-30 DIAGNOSIS — K5730 Diverticulosis of large intestine without perforation or abscess without bleeding: Secondary | ICD-10-CM | POA: Diagnosis present

## 2024-01-30 DIAGNOSIS — F17210 Nicotine dependence, cigarettes, uncomplicated: Secondary | ICD-10-CM | POA: Diagnosis present

## 2024-01-30 DIAGNOSIS — Z1152 Encounter for screening for COVID-19: Secondary | ICD-10-CM | POA: Diagnosis not present

## 2024-01-30 DIAGNOSIS — M109 Gout, unspecified: Secondary | ICD-10-CM | POA: Diagnosis present

## 2024-01-30 DIAGNOSIS — F1210 Cannabis abuse, uncomplicated: Secondary | ICD-10-CM | POA: Diagnosis present

## 2024-01-30 DIAGNOSIS — I10 Essential (primary) hypertension: Secondary | ICD-10-CM | POA: Diagnosis not present

## 2024-01-30 LAB — BASIC METABOLIC PANEL WITH GFR
Anion gap: 10 (ref 5–15)
BUN: 10 mg/dL (ref 6–20)
CO2: 25 mmol/L (ref 22–32)
Calcium: 9 mg/dL (ref 8.9–10.3)
Chloride: 102 mmol/L (ref 98–111)
Creatinine, Ser: 0.91 mg/dL (ref 0.61–1.24)
GFR, Estimated: >60 mL/min (ref >60)
Glucose, Bld: 111 mg/dL — ABNORMAL HIGH (ref 70–99)
Potassium: 3.3 mmol/L — ABNORMAL LOW (ref 3.5–5.1)
Sodium: 137 mmol/L (ref 135–145)

## 2024-01-30 LAB — CBC
HCT: 43.8 % (ref 39.0–52.0)
Hemoglobin: 14.5 g/dL (ref 13.0–17.0)
MCH: 31.8 pg (ref 26.0–34.0)
MCHC: 33.1 g/dL (ref 30.0–36.0)
MCV: 96.1 fL (ref 80.0–100.0)
Platelets: 298 10*3/uL (ref 150–400)
RBC: 4.56 MIL/uL (ref 4.22–5.81)
RDW: 13.2 % (ref 11.5–15.5)
WBC: 9.9 10*3/uL (ref 4.0–10.5)
nRBC: 0 % (ref 0.0–0.2)

## 2024-01-30 LAB — HEMOGLOBIN A1C
Hgb A1c MFr Bld: 5.5 % (ref 4.8–5.6)
Mean Plasma Glucose: 111.15 mg/dL

## 2024-01-30 MED ORDER — POTASSIUM CHLORIDE CRYS ER 20 MEQ PO TBCR
40.0000 meq | EXTENDED_RELEASE_TABLET | ORAL | Status: AC
  Administered 2024-01-30 (×2): 40 meq via ORAL
  Filled 2024-01-30 (×2): qty 2

## 2024-01-30 MED ORDER — HYDRALAZINE HCL 20 MG/ML IJ SOLN
2.0000 mg | Freq: Four times a day (QID) | INTRAMUSCULAR | Status: DC | PRN
  Administered 2024-01-30: 2 mg via INTRAVENOUS
  Filled 2024-01-30: qty 1

## 2024-01-30 MED ORDER — ALUM & MAG HYDROXIDE-SIMETH 200-200-20 MG/5ML PO SUSP
15.0000 mL | Freq: Four times a day (QID) | ORAL | Status: DC | PRN
  Administered 2024-01-30 (×3): 15 mL via ORAL
  Filled 2024-01-30 (×3): qty 30

## 2024-01-30 NOTE — Progress Notes (Signed)
 PROGRESS NOTE    Victor Johnson  ZOX:[id number] DOB: 1976-10-03 DOA: 01/29/2024 PCP: Grayce Sessions, NP  Chief Complaint  Patient presents with   Emesis    Brief Narrative:   Victor Johnson is Victor Johnson 48 y.o. male with medical history significant for gastritis, gout, tobacco and marijuana use who presented to the ED for evaluation of nausea vomiting.   Assessment & Plan:   Principal Problem:   Intractable nausea and vomiting Active Problems:   Hypokalemia   Nausea & vomiting  Intractable nausea vomiting: Persistent symptoms despite initial management in ED.  CT [place]ah [place]/P negative for acute etiology.  Potentially cannabinoid hyperemesis syndrome. - on scheduled reglan - zofran prn  - trial full liquid diet today - discussion with him and his wife regarding the need to avoid cannabis   Hypokalemia: Replace and follow   Tobacco and marijuana use: Patient reports smoking 1 pack/day.  He declines nicotine patch.  He also reports marijuana use.  Gout: Continue allopurinol when tolerating orals.     DVT prophylaxis: lovenox Code Status: full Family Communication: none Disposition:   Status is: Inpatient Remains inpatient appropriate because: need for continued inpatient care   Consultants:  none  Procedures:  none  Antimicrobials:  Anti-infectives (From admission, onward)    None       Subjective: Feels ok, ok with trying full liquids  Objective: Vitals:   01/30/24 0112 01/30/24 0500 01/30/24 0819 01/30/24 1312  BP: (!) 156/118 (!) 187/121 (!) 166/106 (!) 155/114  Pulse: 78 72 88 93  Resp: 18 17 17 18   Temp: 98.7 F (37.1 C) 98.2 F (36.8 C) 98.4 F (36.9 C) 98.1 F (36.7 C)  TempSrc: Oral Oral Oral Oral  SpO2: 98% 100% 100% 100%  Weight:      Height:        Intake/Output Summary (Last 24 hours) at 01/30/2024 1655 Last data filed at 01/30/2024 0448 Gross per 24 hour  Intake 2224.94 ml  Output --  Net 2224.94 ml   Filed Weights   01/29/24 0802   Weight: 65.8 kg    Examination:  General exam: Appears calm and comfortable  Respiratory system: unlabored Cardiovascular system: RRR Gastrointestinal system: Abdomen is nondistended, soft and nontender. Central nervous system: Alert and oriented. No focal neurological deficits. Extremities: no LEE    Data Reviewed: I have personally reviewed following labs and imaging studies  CBC: Recent Labs  Lab 01/29/24 0818 01/30/24 0538  WBC 9.6 9.9  HGB 15.4 14.5  HCT 46.2 43.8  MCV 93.1 96.1  PLT 333 298    Basic Metabolic Panel: Recent Labs  Lab 01/29/24 0818 01/30/24 0538  NA 139 137  K 2.7* 3.3*  CL 98 102  CO2 26 25  GLUCOSE 129* 111*  BUN 14 10  CREATININE 1.01 0.91  CALCIUM 9.6 9.0  MG 2.0  --     GFR: Estimated Creatinine Clearance: 95.4 mL/min (by C-G formula based on SCr of 0.91 mg/dL).  Liver Function Tests: Recent Labs  Lab 01/29/24 0818  AST 14*  ALT 14  ALKPHOS 60  BILITOT 1.8*  PROT 8.6*  ALBUMIN 4.8    CBG: No results for input(s): "GLUCAP" in the last 168 hours.   No results found for this or any previous visit (from the past 240 hours).       Radiology Studies: CT ABDOMEN PELVIS W CONTRAST Result Date: 01/29/2024 CLINICAL DATA:  Abdominal pain. EXAM: CT ABDOMEN AND PELVIS WITH CONTRAST TECHNIQUE: Multidetector CT imaging  of the abdomen and pelvis was performed using the standard protocol following bolus administration of intravenous contrast. RADIATION DOSE REDUCTION: This exam was performed according to the departmental dose-optimization program which includes automated exposure control, adjustment of the mA and/or kV according to patient size and/or use of iterative reconstruction technique. CONTRAST:  OMNIPAQUE IOHEXOL 300 MG/ML  SOLN COMPARISON:  CT abdomen pelvis dated 12/30/2023. FINDINGS: Lower chest: The visualized lung bases are clear. No intra-abdominal free air or free fluid. Hepatobiliary: The liver is unremarkable.  No PVT tension. The gallbladder is unremarkable. Pancreas: Unremarkable. No pancreatic ductal dilatation or surrounding inflammatory changes. Spleen: Normal in size without focal abnormality. Adrenals/Urinary Tract: The adrenal glands unremarkable. The kidneys, visualized ureters, and urinary bladder appear unremarkable. Stomach/Bowel: There is sigmoid diverticulosis with muscular hypertrophy as well as scattered colonic diverticula. There is no bowel obstruction or active inflammation. The appendix is normal. Vascular/Lymphatic: The abdominal aorta and IVC unremarkable. No portal venous gas. There is no adenopathy. Reproductive: The prostate and seminal vesicles are grossly remarkable. No pelvic mass Other: None Musculoskeletal: No acute or significant osseous findings. IMPRESSION: 1. No acute intra-abdominal or pelvic pathology. 2. Colonic diverticulosis. No bowel obstruction. Normal appendix. Electronically Signed   By: Elgie Collard M.D.   On: 01/29/2024 18:28        Scheduled Meds:  allopurinol  100 mg Oral Daily   enoxaparin (LOVENOX) injection  40 mg Subcutaneous Q24H   metoCLOPramide (REGLAN) injection  10 mg Intravenous Q8H   sodium chloride flush  3 mL Intravenous Q12H   Continuous Infusions:   LOS: 0 days    Time spent: over 30 min    Lacretia Nicks, MD [facility]   To contact the attending provider between 7A-7P or the covering provider during after hours 7P-7A, please log into the web site [email or web address] and access using universal [place] password for that web site. If you do not have the password, please call the hospital operator.  01/30/2024, 4:55 PM

## 2024-01-30 NOTE — Plan of Care (Signed)
  Problem: Clinical Measurements: Goal: Ability to maintain clinical measurements within normal limits will improve Outcome: Progressing Goal: Diagnostic test results will improve Outcome: Progressing   Problem: Activity: Goal: Risk for activity intolerance will decrease Outcome: Progressing   Problem: Nutrition: Goal: Adequate nutrition will be maintained Outcome: Progressing   Problem: Elimination: Goal: Will not experience complications related to bowel motility Outcome: Progressing   Problem: Pain Managment: Goal: General experience of comfort will improve and/or be controlled Outcome: Progressing

## 2024-01-30 NOTE — Plan of Care (Signed)

## 2024-01-31 DIAGNOSIS — E876 Hypokalemia: Secondary | ICD-10-CM | POA: Diagnosis not present

## 2024-01-31 DIAGNOSIS — R112 Nausea with vomiting, unspecified: Secondary | ICD-10-CM | POA: Diagnosis not present

## 2024-01-31 LAB — COMPREHENSIVE METABOLIC PANEL WITH GFR
ALT: 14 U/L (ref 0–44)
AST: 15 U/L (ref 15–41)
Albumin: 4 g/dL (ref 3.5–5.0)
Alkaline Phosphatase: 52 U/L (ref 38–126)
Anion gap: 10 (ref 5–15)
BUN: 10 mg/dL (ref 6–20)
CO2: 20 mmol/L — ABNORMAL LOW (ref 22–32)
Calcium: 9 mg/dL (ref 8.9–10.3)
Chloride: 103 mmol/L (ref 98–111)
Creatinine, Ser: 0.93 mg/dL (ref 0.61–1.24)
GFR, Estimated: >60 mL/min (ref >60)
Glucose, Bld: 103 mg/dL — ABNORMAL HIGH (ref 70–99)
Potassium: 4 mmol/L (ref 3.5–5.1)
Sodium: 133 mmol/L — ABNORMAL LOW (ref 135–145)
Total Bilirubin: 1.6 mg/dL — ABNORMAL HIGH (ref 0.0–1.2)
Total Protein: 7.4 g/dL (ref 6.5–8.1)

## 2024-01-31 LAB — CBC WITH DIFFERENTIAL/PLATELET
Abs Immature Granulocytes: 0.04 10*3/uL (ref 0.00–0.07)
Basophils Absolute: 0.1 10*3/uL (ref 0.0–0.1)
Basophils Relative: 1 %
Eosinophils Absolute: 0.2 10*3/uL (ref 0.0–0.5)
Eosinophils Relative: 2 %
HCT: 43.9 % (ref 39.0–52.0)
Hemoglobin: 14.9 g/dL (ref 13.0–17.0)
Immature Granulocytes: 0 %
Lymphocytes Relative: 26 %
Lymphs Abs: 2.6 10*3/uL (ref 0.7–4.0)
MCH: 32.1 pg (ref 26.0–34.0)
MCHC: 33.9 g/dL (ref 30.0–36.0)
MCV: 94.6 fL (ref 80.0–100.0)
Monocytes Absolute: 0.9 10*3/uL (ref 0.1–1.0)
Monocytes Relative: 10 %
Neutro Abs: 6 10*3/uL (ref 1.7–7.7)
Neutrophils Relative %: 61 %
Platelets: 304 10*3/uL (ref 150–400)
RBC: 4.64 MIL/uL (ref 4.22–5.81)
RDW: 13.1 % (ref 11.5–15.5)
WBC: 9.7 10*3/uL (ref 4.0–10.5)
nRBC: 0 % (ref 0.0–0.2)

## 2024-01-31 LAB — PHOSPHORUS: Phosphorus: 2.3 mg/dL — ABNORMAL LOW (ref 2.5–4.6)

## 2024-01-31 LAB — MAGNESIUM: Magnesium: 2 mg/dL (ref 1.7–2.4)

## 2024-01-31 MED ORDER — K PHOS MONO-SOD PHOS DI & MONO 155-852-130 MG PO TABS
500.0000 mg | ORAL_TABLET | Freq: Every day | ORAL | Status: DC
  Administered 2024-01-31: 500 mg via ORAL
  Filled 2024-01-31: qty 2

## 2024-01-31 MED ORDER — PANTOPRAZOLE SODIUM 40 MG PO TBEC: 40.0000 mg | DELAYED_RELEASE_TABLET | Freq: Every day | ORAL | Status: DC

## 2024-01-31 MED ORDER — PANTOPRAZOLE SODIUM 40 MG PO TBEC: 40.0000 mg | DELAYED_RELEASE_TABLET | Freq: Every day | ORAL | 1 refills | Status: AC

## 2024-01-31 NOTE — Discharge Summary (Addendum)
 Physician Discharge Summary   Patient: Victor Johnson MRN: [id number] DOB: 03-19-1976  Admit date:     01/29/2024  Discharge date: 01/31/24  Discharge Physician: Meredeth Ide   PCP: Grayce Sessions, NP   Recommendations at discharge:   Follow-up PCP in 1 week  Discharge Diagnoses: Principal Problem:   Intractable nausea and vomiting Active Problems:   Hypokalemia   Nausea & vomiting  Resolved Problems:   * No resolved hospital problems. *  Hospital Course: Victor Johnson is a 48 y.o. male with medical history significant for gastritis, gout, tobacco and marijuana use who is admitted with intractable nausea and vomiting.  Assessment and Plan:  ntractable nausea vomiting: Persistent symptoms despite initial management in ED.  CT A/P negative for acute etiology.  Potentially cannabinoid hyperemesis syndrome. -Symptoms have resolved. -Will discharge on Protonix 40 mg p.o. daily -Recommended to avoid cannabis.    Hypokalemia: Replete   Tobacco and marijuana use: Patient reports smoking 1 pack/day.  He declines nicotine patch.  He also reports marijuana use. Counseled against marijuana use   Gout: Continue allopurinol when tolerating orals.   Hypophosphatemia -Phosphorus is 2.3 -Will give 1 dose of K-Phos Neutral 500 mg p.o. x 1 before discharge      Consultants:  Procedures performed:  Disposition: Home Diet recommendation:  Discharge Diet Orders (From admission, onward)     Start     Ordered   01/31/24 0000  Diet - low sodium heart healthy        01/31/24 1001           Regular diet DISCHARGE MEDICATION: Allergies as of 01/31/2024   No Known Allergies      Medication List     STOP taking these medications    oxyCODONE 5 MG immediate release tablet Commonly known as: Oxy IR/ROXICODONE       TAKE these medications    acetaminophen 500 MG tablet Commonly known as: TYLENOL Take 1 tablet (500 mg total) by mouth every 6 (six) hours as  needed.   allopurinol 100 MG tablet Commonly known as: ZYLOPRIM Take 1 tablet (100 mg total) by mouth daily.   colchicine 0.6 MG tablet Take 1 tablet (0.6 mg total) by mouth 2 (two) times daily for 7 days.   cyclobenzaprine 10 MG tablet Commonly known as: FLEXERIL TAKE 1 TABLET BY MOUTH TWICE DAILY AS NEEDED FOR MUSCLE SPASM   ondansetron 4 MG tablet Commonly known as: ZOFRAN Take 4 mg by mouth daily.   pantoprazole 40 MG tablet Commonly known as: Protonix Take 1 tablet (40 mg total) by mouth daily. What changed: when to take this        Discharge Exam: Filed Weights   01/29/24 0802  Weight: 65.8 kg   General-appears in no acute distress Heart-S1-S2, regular, no murmur auscultated Lungs-clear to auscultation bilaterally, no wheezing or crackles auscultated Abdomen-soft, nontender, no organomegaly Extremities-no edema in the lower extremities Neuro-alert, oriented x3, no focal deficit noted  Condition at discharge: good  The results of significant diagnostics from this hospitalization (including imaging, microbiology, ancillary and laboratory) are listed below for reference.   Imaging Studies: CT ABDOMEN PELVIS W CONTRAST Result Date: 01/29/2024 CLINICAL DATA:  Abdominal pain. EXAM: CT ABDOMEN AND PELVIS WITH CONTRAST TECHNIQUE: Multidetector CT imaging of the abdomen and pelvis was performed using the standard protocol following bolus administration of intravenous contrast. RADIATION DOSE REDUCTION: This exam was performed according to the departmental dose-optimization program which includes automated exposure control, adjustment of the  mA and/or kV according to patient size and/or use of iterative reconstruction technique. CONTRAST:  OMNIPAQUE IOHEXOL 300 MG/ML  SOLN COMPARISON:  CT abdomen pelvis dated 12/30/2023. FINDINGS: Lower chest: The visualized lung bases are clear. No intra-abdominal free air or free fluid. Hepatobiliary: The liver is unremarkable. No PVT  tension. The gallbladder is unremarkable. Pancreas: Unremarkable. No pancreatic ductal dilatation or surrounding inflammatory changes. Spleen: Normal in size without focal abnormality. Adrenals/Urinary Tract: The adrenal glands unremarkable. The kidneys, visualized ureters, and urinary bladder appear unremarkable. Stomach/Bowel: There is sigmoid diverticulosis with muscular hypertrophy as well as scattered colonic diverticula. There is no bowel obstruction or active inflammation. The appendix is normal. Vascular/Lymphatic: The abdominal aorta and IVC unremarkable. No portal venous gas. There is no adenopathy. Reproductive: The prostate and seminal vesicles are grossly remarkable. No pelvic mass Other: None Musculoskeletal: No acute or significant osseous findings. IMPRESSION: 1. No acute intra-abdominal or pelvic pathology. 2. Colonic diverticulosis. No bowel obstruction. Normal appendix. Electronically Signed   By: Elgie Collard M.D.   On: 01/29/2024 18:28    Microbiology: Results for orders placed or performed during the hospital encounter of 07/08/22  Resp Panel by RT-PCR (Flu A&B, Covid) Urine, Clean Catch     Status: None   Collection Time: 07/08/22  2:35 PM   Specimen: Urine, Clean Catch; Nasal Swab  Result Value Ref Range Status   SARS Coronavirus 2 by RT PCR NEGATIVE NEGATIVE Final    Comment: (NOTE) SARS-CoV-2 target nucleic acids are NOT DETECTED.  The SARS-CoV-2 RNA is generally detectable in upper respiratory specimens during the acute phase of infection. The lowest concentration of SARS-CoV-2 viral copies this assay can detect is 138 copies/mL. A negative result does not preclude SARS-Cov-2 infection and should not be used as the sole basis for treatment or other patient management decisions. A negative result may occur with  improper specimen collection/handling, submission of specimen other than nasopharyngeal swab, presence of viral mutation(s) within the areas targeted by  this assay, and inadequate number of viral copies(<138 copies/mL). A negative result must be combined with clinical observations, patient history, and epidemiological information. The expected result is Negative.  Fact Sheet for Patients:  BloggerCourse.com  Fact Sheet for Healthcare Providers:  SeriousBroker.it  This test is no t yet approved or cleared by the [place] FDA and  has been authorized for detection and/or diagnosis of SARS-CoV-2 by FDA under an Emergency Use Authorization (EUA). This EUA will remain  in effect (meaning this test can be used) for the duration of the COVID-19 declaration under Section 564(b)(1) of the Act, 21 U.S.C.section 360bbb-3(b)(1), unless the authorization is terminated  or revoked sooner.       Influenza A by PCR NEGATIVE NEGATIVE Final   Influenza B by PCR NEGATIVE NEGATIVE Final    Comment: (NOTE) The Xpert Xpress SARS-CoV-2/FLU/RSV plus assay is intended as an aid in the diagnosis of influenza from Nasopharyngeal swab specimens and should not be used as a sole basis for treatment. Nasal washings and aspirates are unacceptable for Xpert Xpress SARS-CoV-2/FLU/RSV testing.  Fact Sheet for Patients: BloggerCourse.com  Fact Sheet for Healthcare Providers: SeriousBroker.it  This test is not yet approved or cleared by the [place] FDA and has been authorized for detection and/or diagnosis of SARS-CoV-2 by FDA under an Emergency Use Authorization (EUA). This EUA will remain in effect (meaning this test can be used) for the duration of the COVID-19 declaration under Section 564(b)(1) of the Act, 21 U.S.C. section 360bbb-3(b)(1),  unless the authorization is terminated or revoked.  Performed at [facility], [address] [place], [place] [address]     Labs: CBC: Recent Labs  Lab 01/29/24 0818 01/30/24 0538  01/31/24 0503  WBC 9.6 9.9 9.7  NEUTROABS  --   --  6.0  HGB 15.4 14.5 14.9  HCT 46.2 43.8 43.9  MCV 93.1 96.1 94.6  PLT 333 298 304   Basic Metabolic Panel: Recent Labs  Lab 01/29/24 0818 01/30/24 0538 01/31/24 0503  NA 139 137 133*  K 2.7* 3.3* 4.0  CL 98 102 103  CO2 26 25 20*  GLUCOSE 129* 111* 103*  BUN 14 10 10   CREATININE 1.01 0.91 0.93  CALCIUM 9.6 9.0 9.0  MG 2.0  --  2.0  PHOS  --   --  2.3*   Liver Function Tests: Recent Labs  Lab 01/29/24 0818 01/31/24 0503  AST 14* 15  ALT 14 14  ALKPHOS 60 52  BILITOT 1.8* 1.6*  PROT 8.6* 7.4  ALBUMIN 4.8 4.0   CBG: No results for input(s): "GLUCAP" in the last 168 hours.  Discharge time spent: greater than 30 minutes.  Signed: Meredeth Ide, MD [facility] 01/31/2024

## 2024-01-31 NOTE — Progress Notes (Signed)
   01/31/24 1016  TOC Brief Assessment  Insurance and Status Reviewed  Patient has primary care physician Yes  Home environment has been reviewed Home w/ spouse  Prior level of function: Independent  Prior/Current Home Services No current home services  Social Drivers of Health Review SDOH reviewed no interventions necessary  Readmission risk has been reviewed Yes  Transition of care needs no transition of care needs at this time

## 2024-02-03 ENCOUNTER — Telehealth: Payer: Self-pay

## 2024-02-03 NOTE — Transitions of Care (Post Inpatient/ED Visit) (Signed)
   02/03/2024  Name: Victor Johnson MRN: [id number] DOB: 01/14/1976  Today's TOC FU Call Status: Today's TOC FU Call Status:: Successful TOC FU Call Completed TOC FU Call Complete Date: 02/03/24 Patient's Name and Date of Birth confirmed.  Transition Care Management Follow-up Telephone Call Date of Discharge: 02/01/24 Discharge Facility: [facility]) Type of Discharge: Inpatient Admission Primary Inpatient Discharge Diagnosis:: nausea vomiting How have you been since you were released from the hospital?: Better Any questions or concerns?: No  Items Reviewed: Did you receive and understand the discharge instructions provided?: Yes Medications obtained,verified, and reconciled?: Yes (Medications Reviewed) Any new allergies since your discharge?: No Dietary orders reviewed?: Yes Do you have support at home?: Yes People in Home: spouse  Medications Reviewed Today: Medications Reviewed Today     Reviewed by Karena Addison, LPN (Licensed Practical Nurse) on 02/03/24 at 1006  Med List Status: <None>   Medication Order Taking? Sig Documenting Provider Last Dose Status Informant  acetaminophen (TYLENOL) 500 MG tablet [id number] No Take 1 tablet (500 mg total) by mouth every 6 (six) hours as needed. Elmer Picker, NP Past Week Active Self, Pharmacy Records  allopurinol (ZYLOPRIM) 100 MG tablet [id number] No Take 1 tablet (100 mg total) by mouth daily. Grayce Sessions, NP Past Week Active Self, Pharmacy Records  colchicine 0.6 MG tablet [id number] No Take 1 tablet (0.6 mg total) by mouth 2 (two) times daily for 7 days.  Patient not taking: Reported on 01/30/2024   Burnadette Pop, MD Not Taking Expired 01/29/24 2359   cyclobenzaprine (FLEXERIL) 10 MG tablet [id number] No TAKE 1 TABLET BY MOUTH TWICE DAILY AS NEEDED FOR MUSCLE SPASM Grayce Sessions, NP Past Month Active Self, Pharmacy Records  ondansetron (ZOFRAN) 4 MG tablet [id number] No Take 4 mg by mouth daily. [provider] Past Week Active Self, Pharmacy Records           Med Note Laray Anger Jan 30, 2024  1:06 PM) Confirms taking every day.  pantoprazole (PROTONIX) 40 MG tablet [id number]  Take 1 tablet (40 mg total) by mouth daily. Meredeth Ide, MD  Active             Home Care and Equipment/Supplies: Were Home Health Services Ordered?: NA Any new equipment or medical supplies ordered?: NA  Functional Questionnaire: Do you need assistance with bathing/showering or dressing?: No Do you need assistance with meal preparation?: No Do you need assistance with eating?: No Do you have difficulty maintaining continence: No Do you need assistance with getting out of bed/getting out of a chair/moving?: No Do you have difficulty managing or taking your medications?: No  Follow up appointments reviewed: PCP Follow-up appointment confirmed?: No (declined appt, will call back) MD Provider Line Number:[phone] Given: No Specialist Hospital Follow-up appointment confirmed?: NA Do you need transportation to your follow-up appointment?: No Do you understand care options if your condition(s) worsen?: Yes-patient verbalized understanding    SIGNATURE Karena Addison, LPN [facility] Nurse Health Advisor Direct Dial [phone]

## 2024-02-14 ENCOUNTER — Ambulatory Visit (INDEPENDENT_AMBULATORY_CARE_PROVIDER_SITE_OTHER)

## 2024-02-18 ENCOUNTER — Ambulatory Visit: Payer: Medicaid Other | Admitting: Physical Medicine and Rehabilitation

## 2024-02-18 ENCOUNTER — Encounter (INDEPENDENT_AMBULATORY_CARE_PROVIDER_SITE_OTHER): Payer: Self-pay | Admitting: Primary Care

## 2024-02-18 ENCOUNTER — Ambulatory Visit (INDEPENDENT_AMBULATORY_CARE_PROVIDER_SITE_OTHER): Admitting: Primary Care

## 2024-02-18 VITALS — BP 144/98 | Wt 144.6 lb

## 2024-02-18 DIAGNOSIS — I10 Essential (primary) hypertension: Secondary | ICD-10-CM | POA: Diagnosis not present

## 2024-02-18 DIAGNOSIS — Z09 Encounter for follow-up examination after completed treatment for conditions other than malignant neoplasm: Secondary | ICD-10-CM | POA: Diagnosis not present

## 2024-02-18 MED ORDER — AMLODIPINE BESYLATE 5 MG PO TABS: 5.0000 mg | ORAL_TABLET | Freq: Every day | ORAL | 0 refills | Status: DC

## 2024-02-18 NOTE — Progress Notes (Unsigned)
   Subjective:   Victor Johnson is a 48 y.o. male presents for hospital follow up.  Patient presented to emergency room on 01/29/24, with Patient states that he has had intractable nausea and vomiting, which had been present for the last 3 or 4 days.  He was unable to keep liquids or foods down.  Patient was hypokalemia, intractable nausea and vomiting and nausea and vomiting.  Discharged on 01/31/24, instructed to  instructed to follow-up with PCP.  Today's he [place] feeling somewhat better.  He is able to keep down liquids and soft foods.  Past Medical History:  Diagnosis Date   Gastritis    Hypertension    Polyp of intestine      No Known Allergies  Current Outpatient Medications on File Prior to Visit  Medication Sig Dispense Refill   acetaminophen (TYLENOL) 500 MG tablet Take 1 tablet (500 mg total) by mouth every 6 (six) hours as needed. 30 tablet 0   allopurinol (ZYLOPRIM) 100 MG tablet Take 1 tablet (100 mg total) by mouth daily. 30 tablet 6   colchicine 0.6 MG tablet Take 1 tablet (0.6 mg total) by mouth 2 (two) times daily for 7 days. (Patient not taking: Reported on 01/30/2024) 14 tablet 0   cyclobenzaprine (FLEXERIL) 10 MG tablet TAKE 1 TABLET BY MOUTH TWICE DAILY AS NEEDED FOR MUSCLE SPASM 60 tablet 0   ondansetron (ZOFRAN) 4 MG tablet Take 4 mg by mouth daily.     pantoprazole (PROTONIX) 40 MG tablet Take 1 tablet (40 mg total) by mouth daily. 30 tablet 1   No current facility-administered medications on file prior to visit.    Review of System: ROS Comprehensive ROS Pertinent positive and negative noted in HPI   Objective:  BP (!) 144/98 (BP Location: Left Arm, Patient Position: Sitting, Cuff Size: Normal)   Wt 144 lb 9.6 oz (65.6 kg)   BMI 21.99 kg/m   Filed Weights   02/18/24 1145  Weight: 144 lb 9.6 oz (65.6 kg)    Physical Exam Vitals reviewed.  Constitutional:      Appearance: He is normal weight.  HENT:     Right Ear: External ear normal.     Left Ear:  External ear normal.     Nose: Nose normal.  Eyes:     Extraocular Movements: Extraocular movements intact.  Cardiovascular:     Rate and Rhythm: Normal rate and regular rhythm.  Pulmonary:     Effort: Pulmonary effort is normal.     Breath sounds: Normal breath sounds.  Abdominal:     General: Bowel sounds are normal.     Palpations: Abdomen is soft.  Musculoskeletal:        General: Normal range of motion.     Cervical back: Normal range of motion.  Skin:    General: Skin is warm and dry.  Neurological:     Mental Status: He is oriented to person, place, and time.  Psychiatric:        Mood and Affect: Mood normal.        Behavior: Behavior normal.      Assessment:  There are no diagnoses linked to this encounter.  This note has been created with Education officer, environmental. Any transcriptional errors are unintentional.   No follow-ups on file.  Grayce Sessions, NP 02/18/2024, 12:03 PM

## 2024-04-20 ENCOUNTER — Emergency Department (HOSPITAL_COMMUNITY)

## 2024-04-20 ENCOUNTER — Encounter (HOSPITAL_COMMUNITY): Payer: Self-pay

## 2024-04-20 ENCOUNTER — Other Ambulatory Visit: Payer: Self-pay

## 2024-04-20 ENCOUNTER — Observation Stay (HOSPITAL_COMMUNITY)
Admission: EM | Admit: 2024-04-20 | Discharge: 2024-04-21 | Disposition: A | Discharge status: Home / Self Care | Attending: Internal Medicine | Admitting: Internal Medicine

## 2024-04-20 DIAGNOSIS — F1090 Alcohol use, unspecified, uncomplicated: Secondary | ICD-10-CM | POA: Diagnosis not present

## 2024-04-20 DIAGNOSIS — F1290 Cannabis use, unspecified, uncomplicated: Secondary | ICD-10-CM | POA: Diagnosis present

## 2024-04-20 DIAGNOSIS — N179 Acute kidney failure, unspecified: Principal | ICD-10-CM | POA: Insufficient documentation

## 2024-04-20 DIAGNOSIS — D72829 Elevated white blood cell count, unspecified: Secondary | ICD-10-CM | POA: Diagnosis not present

## 2024-04-20 DIAGNOSIS — E876 Hypokalemia: Secondary | ICD-10-CM | POA: Diagnosis not present

## 2024-04-20 DIAGNOSIS — R1084 Generalized abdominal pain: Principal | ICD-10-CM | POA: Insufficient documentation

## 2024-04-20 DIAGNOSIS — R1114 Bilious vomiting: Secondary | ICD-10-CM

## 2024-04-20 DIAGNOSIS — R112 Nausea with vomiting, unspecified: Secondary | ICD-10-CM | POA: Diagnosis present

## 2024-04-20 DIAGNOSIS — M109 Gout, unspecified: Secondary | ICD-10-CM | POA: Insufficient documentation

## 2024-04-20 DIAGNOSIS — I10 Essential (primary) hypertension: Secondary | ICD-10-CM | POA: Diagnosis not present

## 2024-04-20 DIAGNOSIS — F17220 Nicotine dependence, chewing tobacco, uncomplicated: Secondary | ICD-10-CM | POA: Diagnosis not present

## 2024-04-20 DIAGNOSIS — R109 Unspecified abdominal pain: Secondary | ICD-10-CM | POA: Diagnosis not present

## 2024-04-20 LAB — COMPREHENSIVE METABOLIC PANEL WITH GFR
ALT: 17 U/L (ref 0–44)
AST: 17 U/L (ref 15–41)
Albumin: 4.9 g/dL (ref 3.5–5.0)
Alkaline Phosphatase: 75 U/L (ref 38–126)
Anion gap: 21 — ABNORMAL HIGH (ref 5–15)
BUN: 30 mg/dL — ABNORMAL HIGH (ref 6–20)
CO2: 27 mmol/L (ref 22–32)
Calcium: 10 mg/dL (ref 8.9–10.3)
Chloride: 85 mmol/L — ABNORMAL LOW (ref 98–111)
Creatinine, Ser: 4.42 mg/dL — ABNORMAL HIGH (ref 0.61–1.24)
GFR, Estimated: 16 mL/min — ABNORMAL LOW (ref >60)
Glucose, Bld: 137 mg/dL — ABNORMAL HIGH (ref 70–99)
Potassium: 3.3 mmol/L — ABNORMAL LOW (ref 3.5–5.1)
Sodium: 133 mmol/L — ABNORMAL LOW (ref 135–145)
Total Bilirubin: 0.9 mg/dL (ref 0.0–1.2)
Total Protein: 9 g/dL — ABNORMAL HIGH (ref 6.5–8.1)

## 2024-04-20 LAB — URINALYSIS, ROUTINE W REFLEX MICROSCOPIC
Bacteria, UA: NONE SEEN
Bilirubin Urine: NEGATIVE
Glucose, UA: NEGATIVE mg/dL
Hgb urine dipstick: NEGATIVE
Ketones, ur: NEGATIVE mg/dL
Leukocytes,Ua: NEGATIVE
Nitrite: NEGATIVE
Protein, ur: 30 mg/dL — AB
Specific Gravity, Urine: 1.023 (ref 1.005–1.030)
pH: 5 (ref 5.0–8.0)

## 2024-04-20 LAB — PHOSPHORUS: Phosphorus: 3.8 mg/dL (ref 2.5–4.6)

## 2024-04-20 LAB — CBC
HCT: 51.7 % (ref 39.0–52.0)
Hemoglobin: 17.7 g/dL — ABNORMAL HIGH (ref 13.0–17.0)
MCH: 31.8 pg (ref 26.0–34.0)
MCHC: 34.2 g/dL (ref 30.0–36.0)
MCV: 93 fL (ref 80.0–100.0)
Platelets: 447 10*3/uL — ABNORMAL HIGH (ref 150–400)
RBC: 5.56 MIL/uL (ref 4.22–5.81)
RDW: 12.7 % (ref 11.5–15.5)
WBC: 15.9 10*3/uL — ABNORMAL HIGH (ref 4.0–10.5)
nRBC: 0 % (ref 0.0–0.2)

## 2024-04-20 LAB — RAPID URINE DRUG SCREEN, HOSP PERFORMED
Amphetamines: NOT DETECTED
Barbiturates: NOT DETECTED
Benzodiazepines: NOT DETECTED
Cocaine: NOT DETECTED
Opiates: NOT DETECTED
Tetrahydrocannabinol: POSITIVE — AB

## 2024-04-20 LAB — CK: Total CK: 204 U/L (ref 49–397)

## 2024-04-20 LAB — MAGNESIUM: Magnesium: 2.1 mg/dL (ref 1.7–2.4)

## 2024-04-20 LAB — LIPASE, BLOOD: Lipase: 48 U/L (ref 11–51)

## 2024-04-20 LAB — ETHANOL: Alcohol, Ethyl (B): <15 mg/dL (ref <15)

## 2024-04-20 MED ORDER — FENTANYL CITRATE PF 50 MCG/ML IJ SOSY
50.0000 ug | PREFILLED_SYRINGE | Freq: Once | INTRAMUSCULAR | Status: AC
  Administered 2024-04-20: 50 ug via INTRAVENOUS
  Filled 2024-04-20: qty 1

## 2024-04-20 MED ORDER — BISACODYL 10 MG RE SUPP
10.0000 mg | Freq: Every day | RECTAL | Status: DC | PRN
Start: 2024-04-20 — End: 2024-04-21

## 2024-04-20 MED ORDER — ONDANSETRON HCL 4 MG PO TABS: 4.0000 mg | ORAL_TABLET | Freq: Four times a day (QID) | ORAL | Status: DC | PRN

## 2024-04-20 MED ORDER — ACETAMINOPHEN 325 MG PO TABS: 650.0000 mg | ORAL_TABLET | Freq: Four times a day (QID) | ORAL | Status: DC | PRN

## 2024-04-20 MED ORDER — SODIUM CHLORIDE 0.9 % IV BOLUS
1000.0000 mL | Freq: Once | INTRAVENOUS | Status: AC
  Administered 2024-04-20: 1000 mL via INTRAVENOUS

## 2024-04-20 MED ORDER — ONDANSETRON HCL 4 MG/2ML IJ SOLN
4.0000 mg | Freq: Once | INTRAMUSCULAR | Status: AC
  Administered 2024-04-20: 4 mg via INTRAVENOUS
  Filled 2024-04-20: qty 2

## 2024-04-20 MED ORDER — ALLOPURINOL 100 MG PO TABS
100.0000 mg | ORAL_TABLET | Freq: Every day | ORAL | Status: DC
  Administered 2024-04-20 – 2024-04-21 (×2): 100 mg via ORAL
  Filled 2024-04-20 (×2): qty 1

## 2024-04-20 MED ORDER — ACETAMINOPHEN 650 MG RE SUPP
650.0000 mg | Freq: Four times a day (QID) | RECTAL | Status: DC | PRN
Start: 2024-04-20 — End: 2024-04-21

## 2024-04-20 MED ORDER — SODIUM CHLORIDE 0.9 % IV SOLN: INTRAVENOUS | Status: DC

## 2024-04-20 MED ORDER — ONDANSETRON HCL 4 MG/2ML IJ SOLN: 4.0000 mg | Freq: Four times a day (QID) | INTRAMUSCULAR | Status: DC | PRN

## 2024-04-20 MED ORDER — PANTOPRAZOLE SODIUM 40 MG IV SOLR
40.0000 mg | Freq: Two times a day (BID) | INTRAVENOUS | Status: DC
  Administered 2024-04-20 – 2024-04-21 (×3): 40 mg via INTRAVENOUS
  Filled 2024-04-20 (×3): qty 10

## 2024-04-20 MED ORDER — NICOTINE 14 MG/24HR TD PT24
14.0000 mg | MEDICATED_PATCH | Freq: Every day | TRANSDERMAL | Status: DC
  Administered 2024-04-21: 14 mg via TRANSDERMAL
  Filled 2024-04-20: qty 1

## 2024-04-20 NOTE — ED Provider Notes (Signed)
 [place] EMERGENCY DEPARTMENT AT [facility] Provider Note   CSN: [id number] Arrival date & time: 04/20/24  1007     Patient presents with: Emesis   Victor Johnson is a 48 y.o. male.   HPI Patient presents with nausea, vomiting, abdominal pain. Onset was a few days ago, since that time he has had an inability to tolerate p.o., has had multiple episodes of vomiting, diffuse abdominal discomfort. Notes 1 prior similar episode a few months ago. Patient has been intolerant of his home meds, oral resuscitation, or any medications taken for relief including Rolaids, Tums.     Prior to Admission medications   Medication Sig Start Date End Date Taking? Authorizing Provider  acetaminophen  (TYLENOL ) 500 MG tablet Take 1 tablet (500 mg total) by mouth every 6 (six) hours as needed. 12/07/23   Imogene Mana, NP  allopurinol  (ZYLOPRIM ) 100 MG tablet Take 1 tablet (100 mg total) by mouth daily. 01/14/24   Marius Siemens, NP  amLODipine  (NORVASC ) 5 MG tablet Take 1 tablet (5 mg total) by mouth daily. 02/18/24   Marius Siemens, NP  colchicine  0.6 MG tablet Take 1 tablet (0.6 mg total) by mouth 2 (two) times daily for 7 days. Patient not taking: Reported on 01/30/2024 01/01/24 01/29/24  Leona Rake, MD  cyclobenzaprine  (FLEXERIL ) 10 MG tablet TAKE 1 TABLET BY MOUTH TWICE DAILY AS NEEDED FOR MUSCLE SPASM 01/19/24   Marius Siemens, NP  ondansetron  (ZOFRAN ) 4 MG tablet Take 4 mg by mouth daily. 12/16/23   [provider]  pantoprazole  (PROTONIX ) 40 MG tablet Take 1 tablet (40 mg total) by mouth daily. 01/31/24 03/31/24  Ozell Blunt, MD    Allergies: Patient has no known allergies.    Review of Systems  Updated Vital Signs BP (!) 117/91   Pulse (!) 110   Temp 98 F (36.7 C) (Oral)   Resp 19   Ht 5' 8 (1.727 m)   Wt 65.8 kg   SpO2 100%   BMI 22.05 kg/m   Physical Exam Vitals and nursing note reviewed.  Constitutional:      Appearance: He is  well-developed. He is ill-appearing.  HENT:     Head: Normocephalic and atraumatic.   Eyes:     Conjunctiva/sclera: Conjunctivae normal.    Cardiovascular:     Rate and Rhythm: Regular rhythm. Tachycardia present.  Pulmonary:     Effort: Pulmonary effort is normal. No respiratory distress.     Breath sounds: No stridor.  Abdominal:     General: There is no distension.     Tenderness: There is abdominal tenderness.   Skin:    General: Skin is warm and dry.   Neurological:     Mental Status: He is alert and oriented to person, place, and time.     (all labs ordered are listed, but only abnormal results are displayed) Labs Reviewed  COMPREHENSIVE METABOLIC PANEL WITH GFR - Abnormal; Notable for the following components:      Result Value   Sodium 133 (*)    Potassium 3.3 (*)    Chloride 85 (*)    Glucose, Bld 137 (*)    BUN 30 (*)    Creatinine, Ser 4.42 (*)    Total Protein 9.0 (*)    GFR, Estimated 16 (*)    Anion gap 21 (*)    All other components within normal limits  CBC - Abnormal; Notable for the following components:   WBC 15.9 (*)  Hemoglobin 17.7 (*)    Platelets 447 (*)    All other components within normal limits  LIPASE, BLOOD  URINALYSIS, ROUTINE W REFLEX MICROSCOPIC  RAPID URINE DRUG SCREEN, HOSP PERFORMED    EKG: None  Radiology: CT Renal Stone Study Result Date: 04/20/2024 CLINICAL DATA:  Abdominal pain kidney stone suspected EXAM: CT ABDOMEN AND PELVIS WITHOUT CONTRAST TECHNIQUE: Multidetector CT imaging of the abdomen and pelvis was performed following the standard protocol without IV contrast. RADIATION DOSE REDUCTION: This exam was performed according to the departmental dose-optimization program which includes automated exposure control, adjustment of the mA and/or kV according to patient size and/or use of iterative reconstruction technique. COMPARISON:  January 29, 2024 FINDINGS: Lower chest: No infiltrates or consolidations, no pleural  effusions Hepatobiliary: Liver normal size no masses no biliary dilatation. Gallbladder unremarkable. No gallstones. Pancreas: Pancreas normal size. No masses calcifications or inflammatory changes. Spleen: Spleen normal size.  No masses. Adrenals/Urinary Tract: Adrenal glands are normal size. Follow-up recommended. Kidneys are normal. No masses calcifications or hydronephrosis Stomach/Bowel: No small or large bowel obstruction or inflammatory changes. Moderate amount of residual fecal material throughout the colon without obstruction or constipation. Vascular/Lymphatic: No significant vascular findings are present. No enlarged abdominal or pelvic lymph nodes. Reproductive: .  No masses. Bladder unremarkable. Other: Anterior abdominal wall unremarkable without evidence of umbilical or inguinal hernias Musculoskeletal: Visualized portion of the thoracolumbar spine and pelvic structures grossly unremarkable without evidence of fracture bony abnormalities or soft tissue masses. IMPRESSION: *No acute findings in the abdomen or pelvis. *Moderate amount of residual fecal material throughout the colon without obstruction or constipation. Electronically Signed   By: Fredrich Jefferson M.D.   On: 04/20/2024 13:56     Procedures   Medications Ordered in the ED  fentaNYL  (SUBLIMAZE ) injection 50 mcg (has no administration in time range)  ondansetron  (ZOFRAN ) injection 4 mg (has no administration in time range)  sodium chloride  0.9 % bolus 1,000 mL (0 mLs Intravenous Stopped 04/20/24 1428)  fentaNYL  (SUBLIMAZE ) injection 50 mcg (50 mcg Intravenous Given 04/20/24 1159)  ondansetron  (ZOFRAN ) injection 4 mg (4 mg Intravenous Given 04/20/24 1200)  sodium chloride  0.9 % bolus 1,000 mL (1,000 mLs Intravenous New Bag/Given 04/20/24 1316)                                    Medical Decision Making Adult male presents with nausea, vomiting, abdominal pain, on exam is tachycardic.  With his history of prior episode, possibly  attributed to [facility] associated gastroparesis, this is a consideration as his obstruction. Patient received empiric fluids, antiemetics, analgesics. Cardiac 110 sinus tach abnormal pulse ox 100% room air normal  Amount and/or Complexity of Data Reviewed External Data Reviewed: notes.    Details: Prior notes reviewed Labs: ordered. Decision-making details documented in ED Course. Radiology: ordered and independent interpretation performed. Decision-making details documented in ED Course.  Risk Prescription drug management. Decision regarding hospitalization. Diagnosis or treatment significantly limited by social determinants of health.   2:51 PM Following 2 L fluid resuscitation heart rate has diminished though not normalized.  Patient is feeling somewhat better after multiple doses of meds. Labs reviewed, discussed, notable for acute kidney failure, with creatinine greater than 4, electrolyte abnormalities consistent with renal dysfunction. Given the patient's acute renal dysfunction, electrolyte abnormalities, persistent tachycardia patient will be admitted.  I discussed this case with her internal medicine team.  CRITICAL CARE Performed by:  Dorenda Gandy Total critical care time: 35 minutes Critical care time was exclusive of separately billable procedures and treating other patients. Critical care was necessary to treat or prevent imminent or life-threatening deterioration. Critical care was time spent personally by me on the following activities: development of treatment plan with patient and/or surrogate as well as nursing, discussions with consultants, evaluation of patient's response to treatment, examination of patient, obtaining history from patient or surrogate, ordering and performing treatments and interventions, ordering and review of laboratory studies, ordering and review of radiographic studies, pulse oximetry and re-evaluation of patient's condition.  Final diagnoses:   Generalized abdominal pain  AKI (acute kidney injury) (HCC)  Bilious vomiting with nausea    ED Discharge Orders     None          Dorenda Gandy, MD 04/20/24 1452

## 2024-04-20 NOTE — ED Triage Notes (Signed)
 Patient has had nausea, vomiting, muscle spasms since Friday. Unable to keep anything down even Gatorade. No diarrhea.

## 2024-04-20 NOTE — ED Notes (Signed)
 Pt stated that he cannot urinate.   Pt asked to check after getting IV fluid.

## 2024-04-20 NOTE — Plan of Care (Signed)

## 2024-04-20 NOTE — H&P (Signed)
 History and Physical    Victor Johnson ZOX:[id number] DOB: October 01, 1976 DOA: 04/20/2024  PCP: Marius Siemens, NP Patient coming from: HOME  Chief Complaint: Nausea and vomiting  HPI: Victor Johnson is a 48 y.o. male with medical history significant of gout, tobacco and marijuana use, hypertension, recent gastritis, admitted with intractable nausea and vomiting.  Urine drug screen last admission was positive for THC.  It is pending this admission.  He reported persistent nausea vomiting no diarrhea abdominal pain unable to keep any food down he has been trying to drink liquids.  Denied fever cough abdominal pain chest pain shortness of breath urinary symptoms.  He lives at home with his wife and children.  In the ER he received 2 L of IV fluids. CT renal stone done in the ER no acute findings, moderate amount of residual fecal material throughout the colon without obstruction or constipation. Significant labs include white count of 15.9 potassium 3.3 creatinine 4.42 from 0.93 in March 2025 He was mated with a diagnosis of AKI and intractable nausea and vomiting.  Past Medical History:  Diagnosis Date   Gastritis    Hypertension    Polyp of intestine     History reviewed. No pertinent surgical history.  Social History   Socioeconomic History   Marital status: Married    Spouse name: Not on file   Number of children: Not on file   Years of education: Not on file   Highest education level: 10th grade  Occupational History   Not on file  Tobacco Use   Smoking status: Every Day    Current packs/day: 1.00    Types: Cigarettes    Passive exposure: Current   Smokeless tobacco: Never  Vaping Use   Vaping status: Not on file  Substance and Sexual Activity   Alcohol use: Yes    Comment: mikes 2 x daily   Drug use: Yes    Types: Marijuana    Comment: 2 x daily   Sexual activity: Yes  Other Topics Concern   Not on file  Social History Narrative   Not on file   Social Drivers  of Health   Financial Resource Strain: Low Risk  (05/12/2023)   Overall Financial Resource Strain (CARDIA)    Difficulty of Paying Living Expenses: Not hard at all  Food Insecurity: No Food Insecurity (01/29/2024)   Hunger Vital Sign    Worried About Running Out of Food in the Last Year: Never true    Ran Out of Food in the Last Year: Never true  Transportation Needs: No Transportation Needs (01/29/2024)   PRAPARE - Administrator, Civil Service (Medical): No    Lack of Transportation (Non-Medical): No  Physical Activity: Insufficiently Active (05/12/2023)   Exercise Vital Sign    Days of Exercise per Week: 3 days    Minutes of Exercise per Session: 30 min  Stress: No Stress Concern Present (05/12/2023)   [employer] of Occupational Health - Occupational Stress Questionnaire    Feeling of Stress : Not at all  Social Connections: Moderately Integrated (05/12/2023)   Social Connection and Isolation Panel    Frequency of Communication with Friends and Family: More than three times a week    Frequency of Social Gatherings with Friends and Family: More than three times a week    Attends Religious Services: 1 to 4 times per year    Active Member of [employer] or Organizations: No    Attends Banker Meetings:  Not on file    Marital Status: Married  Intimate Partner Violence: Not At Risk (01/29/2024)   Humiliation, Afraid, Rape, and Kick questionnaire    Fear of Current or Ex-Partner: No    Emotionally Abused: No    Physically Abused: No    Sexually Abused: No    No Known Allergies  History reviewed. No pertinent family history.    Prior to Admission medications   Medication Sig Start Date End Date Taking? Authorizing Provider  acetaminophen  (TYLENOL ) 500 MG tablet Take 1 tablet (500 mg total) by mouth every 6 (six) hours as needed. 12/07/23   Imogene Mana, NP  allopurinol  (ZYLOPRIM ) 100 MG tablet Take 1 tablet (100 mg total) by mouth daily. 01/14/24   Marius Siemens, NP  amLODipine  (NORVASC ) 5 MG tablet Take 1 tablet (5 mg total) by mouth daily. 02/18/24   Marius Siemens, NP  colchicine  0.6 MG tablet Take 1 tablet (0.6 mg total) by mouth 2 (two) times daily for 7 days. Patient not taking: Reported on 01/30/2024 01/01/24 01/29/24  Leona Rake, MD  cyclobenzaprine  (FLEXERIL ) 10 MG tablet TAKE 1 TABLET BY MOUTH TWICE DAILY AS NEEDED FOR MUSCLE SPASM 01/19/24   Marius Siemens, NP  ondansetron  (ZOFRAN ) 4 MG tablet Take 4 mg by mouth daily. 12/16/23   [provider]  pantoprazole  (PROTONIX ) 40 MG tablet Take 1 tablet (40 mg total) by mouth daily. 01/31/24 03/31/24  Ozell Blunt, MD    Physical Exam: Vitals:   04/20/24 1400 04/20/24 1430 04/20/24 1500 04/20/24 1512  BP: 111/87 (!) 117/91 111/84   Pulse: (!) 101 (!) 110 99   Resp:   18   Temp:    98.1 F (36.7 C)  TempSrc:    Oral  SpO2: 99% 100% 100%   Weight:      Height:         General:  Appears in no acute distress Oral mucosa is dry  cardiovascular: Tachycardic regular Respiratory: Few wheezes  abdomen: Soft not tender no suprapubic tenderness Neurologic:  CN 2-12 grossly intact, moves all extremities in coordinated fashion, sensation intact  Labs on Admission: I have personally reviewed following labs and imaging studies  CBC: Recent Labs  Lab 04/20/24 1012  WBC 15.9*  HGB 17.7*  HCT 51.7  MCV 93.0  PLT 447*   Basic Metabolic Panel: Recent Labs  Lab 04/20/24 1012  NA 133*  K 3.3*  CL 85*  CO2 27  GLUCOSE 137*  BUN 30*  CREATININE 4.42*  CALCIUM 10.0   GFR: Estimated Creatinine Clearance: 19.4 mL/min (A) (by C-G formula based on SCr of 4.42 mg/dL (H)). Liver Function Tests: Recent Labs  Lab 04/20/24 1012  AST 17  ALT 17  ALKPHOS 75  BILITOT 0.9  PROT 9.0*  ALBUMIN  4.9   Recent Labs  Lab 04/20/24 1012  LIPASE 48   No results for input(s): AMMONIA in the last 168 hours. Coagulation Profile: No results for input(s): INR, PROTIME in  the last 168 hours. Cardiac Enzymes: No results for input(s): CKTOTAL, CKMB, CKMBINDEX, TROPONINI in the last 168 hours. BNP (last 3 results) No results for input(s): PROBNP in the last 8760 hours. HbA1C: No results for input(s): HGBA1C in the last 72 hours. CBG: No results for input(s): GLUCAP in the last 168 hours. Lipid Profile: No results for input(s): CHOL, HDL, LDLCALC, TRIG, CHOLHDL, LDLDIRECT in the last 72 hours. Thyroid Function Tests: No results for input(s): TSH, T4TOTAL, FREET4, T3FREE, THYROIDAB in  the last 72 hours. Anemia Panel: No results for input(s): VITAMINB12, FOLATE, FERRITIN, TIBC, IRON, RETICCTPCT in the last 72 hours. Urine analysis:    Component Value Date/Time   COLORURINE AMBER (A) 04/20/2024 1430   APPEARANCEUR HAZY (A) 04/20/2024 1430   LABSPEC 1.023 04/20/2024 1430   PHURINE 5.0 04/20/2024 1430   GLUCOSEU NEGATIVE 04/20/2024 1430   HGBUR NEGATIVE 04/20/2024 1430   BILIRUBINUR NEGATIVE 04/20/2024 1430   KETONESUR NEGATIVE 04/20/2024 1430   PROTEINUR 30 (A) 04/20/2024 1430   NITRITE NEGATIVE 04/20/2024 1430   LEUKOCYTESUR NEGATIVE 04/20/2024 1430    Creatinine Clearance: Estimated Creatinine Clearance: 19.4 mL/min (A) (by C-G formula based on SCr of 4.42 mg/dL (H)).  Sepsis Labs: @LABRCNTIP (procalcitonin:4,lacticidven:4) )No results found for this or any previous visit (from the past 240 hours).   Radiological Exams on Admission: CT Renal Stone Study Result Date: 04/20/2024 CLINICAL DATA:  Abdominal pain kidney stone suspected EXAM: CT ABDOMEN AND PELVIS WITHOUT CONTRAST TECHNIQUE: Multidetector CT imaging of the abdomen and pelvis was performed following the standard protocol without IV contrast. RADIATION DOSE REDUCTION: This exam was performed according to the departmental dose-optimization program which includes automated exposure control, adjustment of the mA and/or kV according to patient size  and/or use of iterative reconstruction technique. COMPARISON:  January 29, 2024 FINDINGS: Lower chest: No infiltrates or consolidations, no pleural effusions Hepatobiliary: Liver normal size no masses no biliary dilatation. Gallbladder unremarkable. No gallstones. Pancreas: Pancreas normal size. No masses calcifications or inflammatory changes. Spleen: Spleen normal size.  No masses. Adrenals/Urinary Tract: Adrenal glands are normal size. Follow-up recommended. Kidneys are normal. No masses calcifications or hydronephrosis Stomach/Bowel: No small or large bowel obstruction or inflammatory changes. Moderate amount of residual fecal material throughout the colon without obstruction or constipation. Vascular/Lymphatic: No significant vascular findings are present. No enlarged abdominal or pelvic lymph nodes. Reproductive: .  No masses. Bladder unremarkable. Other: Anterior abdominal wall unremarkable without evidence of umbilical or inguinal hernias Musculoskeletal: Visualized portion of the thoracolumbar spine and pelvic structures grossly unremarkable without evidence of fracture bony abnormalities or soft tissue masses. IMPRESSION: *No acute findings in the abdomen or pelvis. *Moderate amount of residual fecal material throughout the colon without obstruction or constipation. Electronically Signed   By: Fredrich Jefferson M.D.   On: 04/20/2024 13:56    Assessment/Plan Principal Problem:   AKI (acute kidney injury) (HCC) Active Problems:   Acute kidney failure (HCC)   Hypokalemia   Intractable nausea and vomiting   Nausea and vomiting   Cannabis use disorder   Leukocytosis   #1 intractable nausea and vomiting in the setting of history of EtOH abuse, marijuana use and recent gastritis.  Will keep the patient on clear liquids and supportive treatments provided.  Zofran  as needed, Protonix  40 mg daily  #2 AKI likely from dehydration vomiting IV fluids recheck labs in a.m. Will hold off on further workup  #3  tobacco abuse will order nicotine patch  #4 gout continue allopurinol   #5 hypokalemia replete mag and Phos  #6 history of hypertension hold Norvasc  due to soft BP  Estimated body mass index is 22.05 kg/m as calculated from the following:   Height as of this encounter: 5' 8 (1.727 m).   Weight as of this encounter: 65.8 kg.   DVT prophylaxis: Lovenox  Code Status: Full code Family Communication: None at bedside Disposition Plan: Home Consults called: None Admission status: Observation   Barbee Lew MD  04/20/2024, 3:12 PM

## 2024-04-20 NOTE — ED Notes (Signed)
 Attempted to call report awaiting report sign off

## 2024-04-20 NOTE — ED Notes (Signed)
 Pt stated that he could not urinate.  Gave pt

## 2024-04-21 DIAGNOSIS — N179 Acute kidney failure, unspecified: Secondary | ICD-10-CM | POA: Diagnosis not present

## 2024-04-21 LAB — COMPREHENSIVE METABOLIC PANEL WITH GFR
ALT: 11 U/L (ref 0–44)
AST: 17 U/L (ref 15–41)
Albumin: 3.3 g/dL — ABNORMAL LOW (ref 3.5–5.0)
Alkaline Phosphatase: 51 U/L (ref 38–126)
Anion gap: 12 (ref 5–15)
BUN: 21 mg/dL — ABNORMAL HIGH (ref 6–20)
CO2: 28 mmol/L (ref 22–32)
Calcium: 8.3 mg/dL — ABNORMAL LOW (ref 8.9–10.3)
Chloride: 96 mmol/L — ABNORMAL LOW (ref 98–111)
Creatinine, Ser: 1.6 mg/dL — ABNORMAL HIGH (ref 0.61–1.24)
GFR, Estimated: 53 mL/min — ABNORMAL LOW (ref >60)
Glucose, Bld: 103 mg/dL — ABNORMAL HIGH (ref 70–99)
Potassium: 3.5 mmol/L (ref 3.5–5.1)
Sodium: 136 mmol/L (ref 135–145)
Total Bilirubin: 1.4 mg/dL — ABNORMAL HIGH (ref 0.0–1.2)
Total Protein: 6.2 g/dL — ABNORMAL LOW (ref 6.5–8.1)

## 2024-04-21 LAB — BASIC METABOLIC PANEL WITH GFR
Anion gap: 10 (ref 5–15)
BUN: 20 mg/dL (ref 6–20)
CO2: 25 mmol/L (ref 22–32)
Calcium: 8.2 mg/dL — ABNORMAL LOW (ref 8.9–10.3)
Chloride: 98 mmol/L (ref 98–111)
Creatinine, Ser: 1.36 mg/dL — ABNORMAL HIGH (ref 0.61–1.24)
GFR, Estimated: >60 mL/min (ref >60)
Glucose, Bld: 101 mg/dL — ABNORMAL HIGH (ref 70–99)
Potassium: 3.2 mmol/L — ABNORMAL LOW (ref 3.5–5.1)
Sodium: 133 mmol/L — ABNORMAL LOW (ref 135–145)

## 2024-04-21 LAB — CBC
HCT: 40.1 % (ref 39.0–52.0)
Hemoglobin: 13.7 g/dL (ref 13.0–17.0)
MCH: 32.2 pg (ref 26.0–34.0)
MCHC: 34.2 g/dL (ref 30.0–36.0)
MCV: 94.4 fL (ref 80.0–100.0)
Platelets: 332 10*3/uL (ref 150–400)
RBC: 4.25 MIL/uL (ref 4.22–5.81)
RDW: 12.7 % (ref 11.5–15.5)
WBC: 8.4 10*3/uL (ref 4.0–10.5)
nRBC: 0 % (ref 0.0–0.2)

## 2024-04-21 MED ORDER — HEPARIN SODIUM (PORCINE) 5000 UNIT/ML IJ SOLN
5000.0000 [IU] | Freq: Two times a day (BID) | INTRAMUSCULAR | Status: DC
  Filled 2024-04-21: qty 1

## 2024-04-21 NOTE — Progress Notes (Signed)
 AVS reviewed w/ patient who verbalized an understanding. PIV in place until pt tolerates diet. Will d/c home after lunch if soft diet is tolerated. No other  questions at this time

## 2024-04-21 NOTE — Discharge Summary (Signed)
 Physician Discharge Summary  Victor Johnson UJW:[id number] DOB: [birth year] DOA: 04/20/2024  PCP: Victor Siemens, NP  Admit date: 04/20/2024 Discharge date: 04/21/2024  Admitted From: Home  Discharge disposition: Home   Recommendations for Outpatient Follow-Up:   Follow up with your primary care provider in one week.  Check CBC, BMP, magnesium  in the next visit Encourage oral hydration.   Discharge Diagnosis:   Principal Problem:   AKI (acute kidney injury) (HCC) Active Problems:   Acute kidney failure (HCC)   Hypokalemia   Intractable nausea and vomiting   Nausea and vomiting   Cannabis use disorder   Leukocytosis    Discharge Condition: Improved.  Diet recommendation: Regular diet.  Low-fat diet.  Avoid alcohol  Wound care: None.  Code status: Full.   History of Present Illness:   Victor Johnson is a 48 y.o. male with past medical history significant of gout, tobacco and marijuana use, hypertension, recent gastritis, was admitted to the hospital with intractable nausea and vomiting.    Urine drug screen last admission was positive for THC.  Was unable to keep anything down. CT renal stone done in the ER no acute findings, moderate amount of residual fecal material throughout the colon without obstruction or constipation.  Initial labs showed white count of 15.9 potassium 3.3 creatinine 4.42 from 0.93 in March 2025.  Patient was then admitted hospital for AKI with intractable nausea and vomiting.   Hospital Course:   Following conditions were addressed during hospitalization as listed below,  intractable nausea and vomiting in the setting of history of EtOH abuse, marijuana use and recent gastritis.  Patient received IV fluid hydration antiemetics Protonix .  At this time has been able to tolerate oral diet.   AKI likely from nausea vomiting and volume depletion. Continue IV fluids.  Monitor BMP.   Creatinine on presentation was 4.4.  Baseline creatinine of  0.9 on March 2025.  Creatinine today at 1.6.  Encouraged oral hydration.  Counseled against alcohol cannabis.    tobacco abuse received nicotine patch while in the hospital.  Cannabis use disorder.Victor Johnson  UDS was positive for cannabis.  Counseling done.  Could be component of cannabis hyperemesis syndrome.  Patient does not believe that.    gout continue allopurinol    hypokalemia improved after repletion.  Potassium prior to discharge was 3.5.  Latest magnesium  of 2.1.   Essential hypertension.  Norvasc  on hold.  Can be resumed on discharge.   Disposition.  At this time, patient is stable for disposition home with outpatient PCP follow-up.  Medical Consultants:   None.  Procedures:    None Subjective:   Today, patient was seen and examined at bedside.  Was able to hold food down.  No nausea or vomiting.  Wants to go home.  Discharge Exam:   Vitals:   04/21/24 0438 04/21/24 1327  BP: 120/87 (!) 131/99  Pulse: 80 86  Resp: 16 16  Temp: 98 F (36.7 C) 97.8 F (36.6 C)  SpO2: 99% 100%   Vitals:   04/20/24 1559 04/20/24 2024 04/21/24 0438 04/21/24 1327  BP: 123/81 108/65 120/87 (!) 131/99  Pulse: (!) 101 (!) 101 80 86  Resp: 16 16 16 16   Temp: 98.1 F (36.7 C)  98 F (36.7 C) 97.8 F (36.6 C)  TempSrc: Oral  Oral Oral  SpO2: 97% 99% 99% 100%  Weight:      Height:        General: Alert awake, not in obvious distress HENT: pupils  equally reacting to light,  No scleral pallor or icterus noted. Oral mucosa is moist.  Chest:  Clear breath sounds.  Diminished breath sounds bilaterally. No crackles or wheezes.  CVS: S1 &S2 heard. No murmur.  Regular rate and rhythm. Abdomen: Soft, nontender, nondistended.  Bowel sounds are heard.   Extremities: No cyanosis, clubbing or edema.  Peripheral pulses are palpable. Psych: Alert, awake and oriented, normal mood CNS:  No cranial nerve deficits.  Power equal in all extremities.   Skin: Warm and dry.  No rashes noted.  The results  of significant diagnostics from this hospitalization (including imaging, microbiology, ancillary and laboratory) are listed below for reference.     Diagnostic Studies:   CT Renal Stone Study Result Date: 04/20/2024 CLINICAL DATA:  Abdominal pain kidney stone suspected EXAM: CT ABDOMEN AND PELVIS WITHOUT CONTRAST TECHNIQUE: Multidetector CT imaging of the abdomen and pelvis was performed following the standard protocol without IV contrast. RADIATION DOSE REDUCTION: This exam was performed according to the departmental dose-optimization program which includes automated exposure control, adjustment of the mA and/or kV according to patient size and/or use of iterative reconstruction technique. COMPARISON:  January 29, 2024 FINDINGS: Lower chest: No infiltrates or consolidations, no pleural effusions Hepatobiliary: Liver normal size no masses no biliary dilatation. Gallbladder unremarkable. No gallstones. Pancreas: Pancreas normal size. No masses calcifications or inflammatory changes. Spleen: Spleen normal size.  No masses. Adrenals/Urinary Tract: Adrenal glands are normal size. Follow-up recommended. Kidneys are normal. No masses calcifications or hydronephrosis Stomach/Bowel: No small or large bowel obstruction or inflammatory changes. Moderate amount of residual fecal material throughout the colon without obstruction or constipation. Vascular/Lymphatic: No significant vascular findings are present. No enlarged abdominal or pelvic lymph nodes. Reproductive: .  No masses. Bladder unremarkable. Other: Anterior abdominal wall unremarkable without evidence of umbilical or inguinal hernias Musculoskeletal: Visualized portion of the thoracolumbar spine and pelvic structures grossly unremarkable without evidence of fracture bony abnormalities or soft tissue masses. IMPRESSION: *No acute findings in the abdomen or pelvis. *Moderate amount of residual fecal material throughout the colon without obstruction or  constipation. Electronically Signed   By: Victor Johnson M.D.   On: 04/20/2024 13:56     Labs:   Basic Metabolic Panel: Recent Labs  Lab 04/20/24 1012 04/20/24 1652 04/21/24 0615 04/21/24 0841  NA 133*  --  136 133*  K 3.3*  --  3.5 3.2*  CL 85*  --  96* 98  CO2 27  --  28 25  GLUCOSE 137*  --  103* 101*  BUN 30*  --  21* 20  CREATININE 4.42*  --  1.60* 1.36*  CALCIUM 10.0  --  8.3* 8.2*  MG  --  2.1  --   --   PHOS  --  3.8  --   --    GFR Estimated Creatinine Clearance: 63.2 mL/min (A) (by C-G formula based on SCr of 1.36 mg/dL (H)). Liver Function Tests: Recent Labs  Lab 04/20/24 1012 04/21/24 0615  AST 17 17  ALT 17 11  ALKPHOS 75 51  BILITOT 0.9 1.4*  PROT 9.0* 6.2*  ALBUMIN  4.9 3.3*   Recent Labs  Lab 04/20/24 1012  LIPASE 48   No results for input(s): AMMONIA in the last 168 hours. Coagulation profile No results for input(s): INR, PROTIME in the last 168 hours.  CBC: Recent Labs  Lab 04/20/24 1012 04/21/24 0615  WBC 15.9* 8.4  HGB 17.7* 13.7  HCT 51.7 40.1  MCV 93.0 94.4  PLT 447* 332   Cardiac Enzymes: Recent Labs  Lab 04/20/24 1652  CKTOTAL 204   BNP: Invalid input(s): POCBNP CBG: No results for input(s): GLUCAP in the last 168 hours. D-Dimer No results for input(s): DDIMER in the last 72 hours. Hgb A1c No results for input(s): HGBA1C in the last 72 hours. Lipid Profile No results for input(s): CHOL, HDL, LDLCALC, TRIG, CHOLHDL, LDLDIRECT in the last 72 hours. Thyroid function studies No results for input(s): TSH, T4TOTAL, T3FREE, THYROIDAB in the last 72 hours.  Invalid input(s): FREET3 Anemia work up No results for input(s): VITAMINB12, FOLATE, FERRITIN, TIBC, IRON, RETICCTPCT in the last 72 hours. Microbiology No results found for this or any previous visit (from the past 240 hours).   Discharge Instructions:   Discharge Instructions     Call MD for:  persistant nausea  and vomiting   Complete by: As directed    Call MD for:  severe uncontrolled pain   Complete by: As directed    Discharge instructions   Complete by: As directed    Keep yourself hydrated.  Please avoid alcohol marijuana and over-the-counter pain pills.  Avoid fatty fried greasy cheesy food for few days.  Follow-up with your primary care provider in 1 week.  Check blood work at that time.  Seek medical attention for worsening symptoms.   Increase activity slowly   Complete by: As directed       Allergies as of 04/21/2024   No Known Allergies      Medication List     STOP taking these medications    colchicine  0.6 MG tablet       TAKE these medications    acetaminophen  500 MG tablet Commonly known as: TYLENOL  Take 1 tablet (500 mg total) by mouth every 6 (six) hours as needed. What changed:  how much to take when to take this reasons to take this   allopurinol  100 MG tablet Commonly known as: ZYLOPRIM  Take 1 tablet (100 mg total) by mouth daily.   amLODipine  5 MG tablet Commonly known as: NORVASC  Take 1 tablet (5 mg total) by mouth daily.   cyclobenzaprine  10 MG tablet Commonly known as: FLEXERIL  TAKE 1 TABLET BY MOUTH TWICE DAILY AS NEEDED FOR MUSCLE SPASM What changed: See the new instructions.   ondansetron  4 MG tablet Commonly known as: ZOFRAN  Take 4 mg by mouth daily as needed for nausea or vomiting.   pantoprazole  40 MG tablet Commonly known as: Protonix  Take 1 tablet (40 mg total) by mouth daily.        Follow-up Information     Victor Siemens, NP Follow up in 1 week(s).   Specialty: Internal Medicine Contact information: [address] [place] [address] [phone]                  Time coordinating discharge: 39 minutes  Signed:  Kairo Laubacher  [facility] 04/21/2024, 2:03 PM

## 2024-04-21 NOTE — Progress Notes (Signed)
 Pt tolerated soft diet for lunch. MD made aware. Will proceed with discharge

## 2024-04-21 NOTE — Progress Notes (Deleted)
 Physician Discharge Summary  Slaton Reaser YQM:[id number] DOB: 1976/02/03 DOA: 04/20/2024  PCP: Marius Siemens, NP  Admit date: 04/20/2024 Discharge date: 04/21/2024  Admitted From: Home  Discharge disposition: Home   Recommendations for Outpatient Follow-Up:   Follow up with your primary care provider in one week.  Check CBC, BMP, magnesium  in the next visit Encourage oral hydration.   Discharge Diagnosis:   Principal Problem:   AKI (acute kidney injury) (HCC) Active Problems:   Acute kidney failure (HCC)   Hypokalemia   Intractable nausea and vomiting   Nausea and vomiting   Cannabis use disorder   Leukocytosis    Discharge Condition: Improved.  Diet recommendation: Regular diet.  Low-fat diet.  Avoid alcohol  Wound care: None.  Code status: Full.   History of Present Illness:   Victor Johnson is a 48 y.o. male with past medical history significant of gout, tobacco and marijuana use, hypertension, recent gastritis, was admitted to the hospital with intractable nausea and vomiting.    Urine drug screen last admission was positive for THC.  Was unable to keep anything down. CT renal stone done in the ER no acute findings, moderate amount of residual fecal material throughout the colon without obstruction or constipation.  Initial labs showed white count of 15.9 potassium 3.3 creatinine 4.42 from 0.93 in March 2025.  Patient was then admitted hospital for AKI with intractable nausea and vomiting.   Hospital Course:   Following conditions were addressed during hospitalization as listed below,  intractable nausea and vomiting in the setting of history of EtOH abuse, marijuana use and recent gastritis.  Patient received IV fluid hydration antiemetics Protonix .  At this time has been able to tolerate oral diet.   AKI likely from nausea vomiting and volume depletion. Continue IV fluids.  Monitor BMP.   Creatinine on presentation was 4.4.  Baseline creatinine of  0.9 on March 2025.  Creatinine today at 1.6.  Encouraged oral hydration.  Counseled against alcohol cannabis.    tobacco abuse received nicotine patch while in the hospital.  Cannabis use disorder.Aaron Aas  UDS was positive for cannabis.  Counseling done.  Could be component of cannabis hyperemesis syndrome.  Patient does not believe that.    gout continue allopurinol    hypokalemia improved after repletion.  Potassium prior to discharge was 3.5.  Latest magnesium  of 2.1.   Essential hypertension.  Norvasc  on hold.  Can be resumed on discharge.   Disposition.  At this time, patient is stable for disposition home with outpatient PCP follow-up.  Medical Consultants:   None.  Procedures:    None Subjective:   Today, patient was seen and examined at bedside.  Was able to hold food down.  No nausea or vomiting.  Wants to go home.  Discharge Exam:   Vitals:   04/21/24 0438 04/21/24 1327  BP: 120/87 (!) 131/99  Pulse: 80 86  Resp: 16 16  Temp: 98 F (36.7 C) 97.8 F (36.6 C)  SpO2: 99% 100%   Vitals:   04/20/24 1559 04/20/24 2024 04/21/24 0438 04/21/24 1327  BP: 123/81 108/65 120/87 (!) 131/99  Pulse: (!) 101 (!) 101 80 86  Resp: 16 16 16 16   Temp: 98.1 F (36.7 C)  98 F (36.7 C) 97.8 F (36.6 C)  TempSrc: Oral  Oral Oral  SpO2: 97% 99% 99% 100%  Weight:      Height:        General: Alert awake, not in obvious distress HENT: pupils  equally reacting to light,  No scleral pallor or icterus noted. Oral mucosa is moist.  Chest:  Clear breath sounds.  Diminished breath sounds bilaterally. No crackles or wheezes.  CVS: S1 &S2 heard. No murmur.  Regular rate and rhythm. Abdomen: Soft, nontender, nondistended.  Bowel sounds are heard.   Extremities: No cyanosis, clubbing or edema.  Peripheral pulses are palpable. Psych: Alert, awake and oriented, normal mood CNS:  No cranial nerve deficits.  Power equal in all extremities.   Skin: Warm and dry.  No rashes noted.  The results  of significant diagnostics from this hospitalization (including imaging, microbiology, ancillary and laboratory) are listed below for reference.     Diagnostic Studies:   CT Renal Stone Study Result Date: 04/20/2024 CLINICAL DATA:  Abdominal pain kidney stone suspected EXAM: CT ABDOMEN AND PELVIS WITHOUT CONTRAST TECHNIQUE: Multidetector CT imaging of the abdomen and pelvis was performed following the standard protocol without IV contrast. RADIATION DOSE REDUCTION: This exam was performed according to the departmental dose-optimization program which includes automated exposure control, adjustment of the mA and/or kV according to patient size and/or use of iterative reconstruction technique. COMPARISON:  January 29, 2024 FINDINGS: Lower chest: No infiltrates or consolidations, no pleural effusions Hepatobiliary: Liver normal size no masses no biliary dilatation. Gallbladder unremarkable. No gallstones. Pancreas: Pancreas normal size. No masses calcifications or inflammatory changes. Spleen: Spleen normal size.  No masses. Adrenals/Urinary Tract: Adrenal glands are normal size. Follow-up recommended. Kidneys are normal. No masses calcifications or hydronephrosis Stomach/Bowel: No small or large bowel obstruction or inflammatory changes. Moderate amount of residual fecal material throughout the colon without obstruction or constipation. Vascular/Lymphatic: No significant vascular findings are present. No enlarged abdominal or pelvic lymph nodes. Reproductive: .  No masses. Bladder unremarkable. Other: Anterior abdominal wall unremarkable without evidence of umbilical or inguinal hernias Musculoskeletal: Visualized portion of the thoracolumbar spine and pelvic structures grossly unremarkable without evidence of fracture bony abnormalities or soft tissue masses. IMPRESSION: *No acute findings in the abdomen or pelvis. *Moderate amount of residual fecal material throughout the colon without obstruction or  constipation. Electronically Signed   By: Fredrich Jefferson M.D.   On: 04/20/2024 13:56     Labs:   Basic Metabolic Panel: Recent Labs  Lab 04/20/24 1012 04/20/24 1652 04/21/24 0615 04/21/24 0841  NA 133*  --  136 133*  K 3.3*  --  3.5 3.2*  CL 85*  --  96* 98  CO2 27  --  28 25  GLUCOSE 137*  --  103* 101*  BUN 30*  --  21* 20  CREATININE 4.42*  --  1.60* 1.36*  CALCIUM 10.0  --  8.3* 8.2*  MG  --  2.1  --   --   PHOS  --  3.8  --   --    GFR Estimated Creatinine Clearance: 63.2 mL/min (A) (by C-G formula based on SCr of 1.36 mg/dL (H)). Liver Function Tests: Recent Labs  Lab 04/20/24 1012 04/21/24 0615  AST 17 17  ALT 17 11  ALKPHOS 75 51  BILITOT 0.9 1.4*  PROT 9.0* 6.2*  ALBUMIN  4.9 3.3*   Recent Labs  Lab 04/20/24 1012  LIPASE 48   No results for input(s): AMMONIA in the last 168 hours. Coagulation profile No results for input(s): INR, PROTIME in the last 168 hours.  CBC: Recent Labs  Lab 04/20/24 1012 04/21/24 0615  WBC 15.9* 8.4  HGB 17.7* 13.7  HCT 51.7 40.1  MCV 93.0 94.4  PLT 447* 332   Cardiac Enzymes: Recent Labs  Lab 04/20/24 1652  CKTOTAL 204   BNP: Invalid input(s): POCBNP CBG: No results for input(s): GLUCAP in the last 168 hours. D-Dimer No results for input(s): DDIMER in the last 72 hours. Hgb A1c No results for input(s): HGBA1C in the last 72 hours. Lipid Profile No results for input(s): CHOL, HDL, LDLCALC, TRIG, CHOLHDL, LDLDIRECT in the last 72 hours. Thyroid function studies No results for input(s): TSH, T4TOTAL, T3FREE, THYROIDAB in the last 72 hours.  Invalid input(s): FREET3 Anemia work up No results for input(s): VITAMINB12, FOLATE, FERRITIN, TIBC, IRON, RETICCTPCT in the last 72 hours. Microbiology No results found for this or any previous visit (from the past 240 hours).   Discharge Instructions:   Discharge Instructions     Call MD for:  persistant nausea  and vomiting   Complete by: As directed    Call MD for:  severe uncontrolled pain   Complete by: As directed    Discharge instructions   Complete by: As directed    Keep yourself hydrated.  Please avoid alcohol marijuana and over-the-counter pain pills.  Avoid fatty fried greasy cheesy food for few days.  Follow-up with your primary care provider in 1 week.  Check blood work at that time.  Seek medical attention for worsening symptoms.   Increase activity slowly   Complete by: As directed       Allergies as of 04/21/2024   No Known Allergies      Medication List     STOP taking these medications    colchicine  0.6 MG tablet       TAKE these medications    acetaminophen  500 MG tablet Commonly known as: TYLENOL  Take 1 tablet (500 mg total) by mouth every 6 (six) hours as needed. What changed:  how much to take when to take this reasons to take this   allopurinol  100 MG tablet Commonly known as: ZYLOPRIM  Take 1 tablet (100 mg total) by mouth daily.   amLODipine  5 MG tablet Commonly known as: NORVASC  Take 1 tablet (5 mg total) by mouth daily.   cyclobenzaprine  10 MG tablet Commonly known as: FLEXERIL  TAKE 1 TABLET BY MOUTH TWICE DAILY AS NEEDED FOR MUSCLE SPASM What changed: See the new instructions.   ondansetron  4 MG tablet Commonly known as: ZOFRAN  Take 4 mg by mouth daily as needed for nausea or vomiting.   pantoprazole  40 MG tablet Commonly known as: Protonix  Take 1 tablet (40 mg total) by mouth daily.        Follow-up Information     Marius Siemens, NP Follow up in 1 week(s).   Specialty: Internal Medicine Contact information: [address] [place] [address] [phone]                  Time coordinating discharge: 39 minutes  Signed:  Janney Priego  [facility] 04/21/2024, 1:58 PM

## 2024-04-22 ENCOUNTER — Telehealth (INDEPENDENT_AMBULATORY_CARE_PROVIDER_SITE_OTHER): Payer: Self-pay

## 2024-04-22 NOTE — Transitions of Care (Post Inpatient/ED Visit) (Signed)
   04/22/2024  Name: Victor Johnson MRN: [id number] DOB: 16-Feb-1976  Today's TOC FU Call Status: Today's TOC FU Call Status:: Successful TOC FU Call Completed TOC FU Call Complete Date: 04/22/24 Patient's Name and Date of Birth confirmed.  Transition Care Management Follow-up Telephone Call Date of Discharge: 04/21/24 Discharge Facility: [facility]) Type of Discharge: Inpatient Admission Primary Inpatient Discharge Diagnosis:: AKF How have you been since you were released from the hospital?: Better Any questions or concerns?: No  Items Reviewed: Did you receive and understand the discharge instructions provided?: Yes Medications obtained,verified, and reconciled?: Yes (Medications Reviewed) Any new allergies since your discharge?: No Dietary orders reviewed?: Yes Do you have support at home?: Yes People in Home [RPT]: significant other  Medications Reviewed Today: Medications Reviewed Today   Medications were not reviewed in this encounter     Home Care and Equipment/Supplies: Were Home Health Services Ordered?: NA Any new equipment or medical supplies ordered?: NA  Functional Questionnaire: Do you need assistance with bathing/showering or dressing?: No Do you need assistance with meal preparation?: No Do you need assistance with eating?: No Do you have difficulty maintaining continence: No Do you need assistance with getting out of bed/getting out of a chair/moving?: No Do you have difficulty managing or taking your medications?: No  Follow up appointments reviewed: PCP Follow-up appointment confirmed?: No (no avail appts. sent message to staff) Specialist Hospital Follow-up appointment confirmed?: NA Do you need transportation to your follow-up appointment?: No Do you understand care options if your condition(s) worsen?: Yes-patient verbalized understanding    SIGNATURE Darrall Ellison, LPN [facility] Nurse Health Advisor Direct Dial [phone]

## 2024-04-27 ENCOUNTER — Other Ambulatory Visit (INDEPENDENT_AMBULATORY_CARE_PROVIDER_SITE_OTHER): Payer: Self-pay | Admitting: Primary Care

## 2024-04-27 NOTE — Telephone Encounter (Signed)
 Will forward to provider

## 2024-05-07 ENCOUNTER — Encounter (HOSPITAL_COMMUNITY): Payer: Self-pay | Admitting: Emergency Medicine

## 2024-05-07 ENCOUNTER — Emergency Department (HOSPITAL_COMMUNITY)

## 2024-05-07 ENCOUNTER — Emergency Department (HOSPITAL_COMMUNITY): Admission: EM | Admit: 2024-05-07 | Discharge: 2024-05-07 | Disposition: A | Discharge status: Home / Self Care

## 2024-05-07 DIAGNOSIS — R112 Nausea with vomiting, unspecified: Secondary | ICD-10-CM | POA: Diagnosis not present

## 2024-05-07 DIAGNOSIS — K5732 Diverticulitis of large intestine without perforation or abscess without bleeding: Secondary | ICD-10-CM | POA: Insufficient documentation

## 2024-05-07 DIAGNOSIS — N179 Acute kidney failure, unspecified: Secondary | ICD-10-CM | POA: Diagnosis not present

## 2024-05-07 DIAGNOSIS — Z79899 Other long term (current) drug therapy: Secondary | ICD-10-CM | POA: Diagnosis not present

## 2024-05-07 DIAGNOSIS — R1030 Lower abdominal pain, unspecified: Secondary | ICD-10-CM

## 2024-05-07 DIAGNOSIS — I10 Essential (primary) hypertension: Secondary | ICD-10-CM | POA: Diagnosis not present

## 2024-05-07 DIAGNOSIS — K5792 Diverticulitis of intestine, part unspecified, without perforation or abscess without bleeding: Secondary | ICD-10-CM | POA: Diagnosis not present

## 2024-05-07 DIAGNOSIS — R1032 Left lower quadrant pain: Secondary | ICD-10-CM | POA: Diagnosis not present

## 2024-05-07 LAB — COMPREHENSIVE METABOLIC PANEL WITH GFR
ALT: 10 U/L (ref 0–44)
AST: 12 U/L — ABNORMAL LOW (ref 15–41)
Albumin: 4.5 g/dL (ref 3.5–5.0)
Alkaline Phosphatase: 78 U/L (ref 38–126)
Anion gap: 15 (ref 5–15)
BUN: 14 mg/dL (ref 6–20)
CO2: 22 mmol/L (ref 22–32)
Calcium: 9.4 mg/dL (ref 8.9–10.3)
Chloride: 99 mmol/L (ref 98–111)
Creatinine, Ser: 1.65 mg/dL — ABNORMAL HIGH (ref 0.61–1.24)
GFR, Estimated: 52 mL/min — ABNORMAL LOW (ref >60)
Glucose, Bld: 126 mg/dL — ABNORMAL HIGH (ref 70–99)
Potassium: 3.6 mmol/L (ref 3.5–5.1)
Sodium: 136 mmol/L (ref 135–145)
Total Bilirubin: 1.4 mg/dL — ABNORMAL HIGH (ref 0.0–1.2)
Total Protein: 8.7 g/dL — ABNORMAL HIGH (ref 6.5–8.1)

## 2024-05-07 LAB — URINALYSIS, ROUTINE W REFLEX MICROSCOPIC
Bacteria, UA: NONE SEEN
Glucose, UA: NEGATIVE mg/dL
Hgb urine dipstick: NEGATIVE
Ketones, ur: 5 mg/dL — AB
Leukocytes,Ua: NEGATIVE
Nitrite: NEGATIVE
Protein, ur: 100 mg/dL — AB
Specific Gravity, Urine: 1.034 — ABNORMAL HIGH (ref 1.005–1.030)
pH: 5 (ref 5.0–8.0)

## 2024-05-07 LAB — CBC
HCT: 48 % (ref 39.0–52.0)
Hemoglobin: 16.4 g/dL (ref 13.0–17.0)
MCH: 31.9 pg (ref 26.0–34.0)
MCHC: 34.2 g/dL (ref 30.0–36.0)
MCV: 93.4 fL (ref 80.0–100.0)
Platelets: 508 10*3/uL — ABNORMAL HIGH (ref 150–400)
RBC: 5.14 MIL/uL (ref 4.22–5.81)
RDW: 13.7 % (ref 11.5–15.5)
WBC: 15.6 10*3/uL — ABNORMAL HIGH (ref 4.0–10.5)
nRBC: 0 % (ref 0.0–0.2)

## 2024-05-07 LAB — CK: Total CK: 50 U/L (ref 49–397)

## 2024-05-07 LAB — LIPASE, BLOOD: Lipase: 31 U/L (ref 11–51)

## 2024-05-07 MED ORDER — SODIUM CHLORIDE 0.9 % IV BOLUS
1000.0000 mL | Freq: Once | INTRAVENOUS | Status: AC
  Administered 2024-05-07: 1000 mL via INTRAVENOUS

## 2024-05-07 MED ORDER — IOHEXOL 300 MG/ML  SOLN
100.0000 mL | Freq: Once | INTRAMUSCULAR | Status: AC | PRN
  Administered 2024-05-07: 100 mL via INTRAVENOUS

## 2024-05-07 MED ORDER — OXYCODONE HCL 5 MG PO TABS
5.0000 mg | ORAL_TABLET | Freq: Once | ORAL | Status: AC
  Administered 2024-05-07: 5 mg via ORAL
  Filled 2024-05-07: qty 1

## 2024-05-07 MED ORDER — AMOXICILLIN-POT CLAVULANATE 875-125 MG PO TABS: 1.0000 | ORAL_TABLET | Freq: Two times a day (BID) | ORAL | 0 refills | Status: AC

## 2024-05-07 NOTE — Discharge Instructions (Signed)
 Please take the antibiotics as prescribed.   Please read over the information given.   Please eat a bland diet going forward until you start feeling better. Please mostly ingest liquid diet over the next couple of days as well as food such as grilled chicken.

## 2024-05-07 NOTE — ED Provider Notes (Signed)
 [place] EMERGENCY DEPARTMENT AT [facility] Provider Note   CSN: [id number] Arrival date & time: 05/07/24  1012     Patient presents with: Abdominal Pain   Victor Johnson is a 48 y.o. male.    Abdominal Pain  Patient states he presents because of abdominal pain.  Patient states that since Tuesday is been having lower abdominal pain with nausea and vomiting.  Patient states his bowel movements been normal.  No diarrhea.  No blood in stool.  No dark emesis.  Patient states that he feels like he started having some pain after he ate [employer].  States he also smokes marijuana on daily basis and sometimes warm baths help his symptoms.  Denies any chest pain or shortness of breath.  No fever no chills.  States he feels very dehydrated.  No chest pain.  No hemoptysis.  Otherwise, feels at his baseline.  He does endorse some generalized muscle cramping in his arms and legs.  Previous medical history reviewed : Patient last admitted and discharged on June 17.  Was admitted in the setting of AKI.  Intractable nausea and vomiting.  Creatinine was 4.4 on presentation.  Baseline around 0.9.  At time discharge, creatinine 1.6.     Prior to Admission medications   Medication Sig Start Date End Date Taking? Authorizing Provider  amoxicillin -clavulanate (AUGMENTIN ) 875-125 MG tablet Take 1 tablet by mouth every 12 (twelve) hours. 05/07/24  Yes Simon Lavonia SAILOR, MD  acetaminophen  (TYLENOL ) 500 MG tablet Take 1 tablet (500 mg total) by mouth every 6 (six) hours as needed. Patient taking differently: Take 1,000 mg by mouth daily as needed for mild pain (pain score 1-3) or moderate pain (pain score 4-6). 12/07/23   Remi Pippin, NP  allopurinol  (ZYLOPRIM ) 100 MG tablet Take 1 tablet (100 mg total) by mouth daily. 01/14/24   Celestia Rosaline SQUIBB, NP  amLODipine  (NORVASC ) 5 MG tablet Take 1 tablet by mouth once daily 04/30/24   Celestia Rosaline SQUIBB, NP  cyclobenzaprine  (FLEXERIL ) 10 MG tablet TAKE 1  TABLET BY MOUTH TWICE DAILY AS NEEDED FOR MUSCLE SPASM Patient taking differently: Take 10-20 mg by mouth daily as needed for muscle spasms. 01/19/24   Celestia Rosaline SQUIBB, NP  ondansetron  (ZOFRAN ) 4 MG tablet Take 4 mg by mouth daily as needed for nausea or vomiting. 12/16/23   [provider]  pantoprazole  (PROTONIX ) 40 MG tablet Take 1 tablet (40 mg total) by mouth daily. 01/31/24 04/22/24  Drusilla Sabas RAMAN, MD    Allergies: Patient has no known allergies.    Review of Systems  Gastrointestinal:  Positive for abdominal pain.    Updated Vital Signs BP (!) 136/97 (BP Location: Right Arm)   Pulse 97   Temp 98.2 F (36.8 C) (Oral)   Resp 16   SpO2 100%   Physical Exam Vitals and nursing note reviewed.  Constitutional:      General: He is not in acute distress.    Appearance: He is well-developed.  HENT:     Head: Normocephalic and atraumatic.  Eyes:     Conjunctiva/sclera: Conjunctivae normal.  Cardiovascular:     Rate and Rhythm: Normal rate and regular rhythm.     Heart sounds: No murmur heard. Pulmonary:     Effort: Pulmonary effort is normal. No respiratory distress.     Breath sounds: Normal breath sounds.  Abdominal:     Palpations: Abdomen is soft.     Tenderness: There is no abdominal tenderness.  Musculoskeletal:  General: No swelling.     Cervical back: Neck supple.  Skin:    General: Skin is warm and dry.     Capillary Refill: Capillary refill takes less than 2 seconds.  Neurological:     Mental Status: He is alert.  Psychiatric:        Mood and Affect: Mood normal.     (all labs ordered are listed, but only abnormal results are displayed) Labs Reviewed  COMPREHENSIVE METABOLIC PANEL WITH GFR - Abnormal; Notable for the following components:      Result Value   Glucose, Bld 126 (*)    Creatinine, Ser 1.65 (*)    Total Protein 8.7 (*)    AST 12 (*)    Total Bilirubin 1.4 (*)    GFR, Estimated 52 (*)    All other components within normal  limits  CBC - Abnormal; Notable for the following components:   WBC 15.6 (*)    Platelets 508 (*)    All other components within normal limits  URINALYSIS, ROUTINE W REFLEX MICROSCOPIC - Abnormal; Notable for the following components:   Color, Urine AMBER (*)    APPearance HAZY (*)    Specific Gravity, Urine 1.034 (*)    Bilirubin Urine SMALL (*)    Ketones, ur 5 (*)    Protein, ur 100 (*)    All other components within normal limits  LIPASE, BLOOD  CK    EKG: EKG Interpretation Date/Time:  Thursday May 07 2024 11:19:41 EDT Ventricular Rate:  86 PR Interval:  116 QRS Duration:  88 QT Interval:  371 QTC Calculation: 444 R Axis:   79  Text Interpretation: Sinus rhythm Borderline short PR interval Probable left atrial enlargement Probable left ventricular hypertrophy T wave inversion Confirmed by Simon Rea [phone]) on 05/07/2024 11:31:36 AM  Radiology: CT ABDOMEN PELVIS W CONTRAST Result Date: 05/07/2024 CLINICAL DATA:  Lower abdominal pain and cramping since Tuesday, nausea and vomiting, dark urine EXAM: CT ABDOMEN AND PELVIS WITH CONTRAST TECHNIQUE: Multidetector CT imaging of the abdomen and pelvis was performed using the standard protocol following bolus administration of intravenous contrast. RADIATION DOSE REDUCTION: This exam was performed according to the departmental dose-optimization program which includes automated exposure control, adjustment of the mA and/or kV according to patient size and/or use of iterative reconstruction technique. CONTRAST:  OMNIPAQUE  IOHEXOL  300 MG/ML  SOLN COMPARISON:  04/20/2024 FINDINGS: Lower chest: No acute pleural or parenchymal lung disease. Hepatobiliary: No focal liver abnormality is seen. No gallstones, gallbladder wall thickening, or biliary dilatation. Pancreas: Unremarkable. No pancreatic ductal dilatation or surrounding inflammatory changes. Spleen: Normal in size without focal abnormality. Adrenals/Urinary Tract: Adrenal glands are  unremarkable. Kidneys are normal, without renal calculi, focal lesion, or hydronephrosis. The bladder is decompressed, limiting its evaluation. Stomach/Bowel: No bowel obstruction or ileus. There is marked diverticulosis throughout the descending and sigmoid colon. Segmental mural thickening and pericolonic fat stranding within the mid sigmoid colon compatible with acute uncomplicated diverticulitis. No perforation, fluid collection, or abscess. Normal appendix right lower quadrant. Vascular/Lymphatic: No significant vascular findings are present. No enlarged abdominal or pelvic lymph nodes. Reproductive: Prostate is unremarkable. Other: No free fluid or free intraperitoneal gas. No abdominal wall hernia. Musculoskeletal: No acute or destructive bony abnormalities. Reconstructed images demonstrate no additional findings. IMPRESSION: 1. Acute uncomplicated sigmoid diverticulitis. No perforation, fluid collection, or abscess. Electronically Signed   By: Ozell Daring M.D.   On: 05/07/2024 14:17     Procedures   Medications Ordered in the  ED  oxyCODONE  (Oxy IR/ROXICODONE ) immediate release tablet 5 mg (has no administration in time range)  sodium chloride  0.9 % bolus 1,000 mL (0 mLs Intravenous Stopped 05/07/24 1321)  sodium chloride  0.9 % bolus 1,000 mL (1,000 mLs Intravenous New Bag/Given 05/07/24 1321)  iohexol  (OMNIPAQUE ) 300 MG/ML solution 100 mL (100 mLs Intravenous Contrast Given 05/07/24 1336)                                    Medical Decision Making Amount and/or Complexity of Data Reviewed Labs: ordered. Radiology: ordered.  Risk Prescription drug management.    Patient states he presents because of abdominal pain.  Patient states that since Tuesday is been having lower abdominal pain with nausea and vomiting.  Patient states his bowel movements been normal.  No diarrhea.  No blood in stool.  No dark emesis.  Patient states that he feels like he started having some pain after he ate  [employer].  States he also smokes marijuana on daily basis and sometimes warm baths help his symptoms.  Denies any chest pain or shortness of breath.  No fever no chills.  States he feels very dehydrated.  No chest pain.  No hemoptysis.  Otherwise, feels at his baseline.  He does endorse some generalized muscle cramping in his arms and legs.  Previous medical history reviewed : Patient last admitted and discharged on June 17.  Was admitted in the setting of AKI.  Intractable nausea and vomiting.  Creatinine was 4.4 on presentation.  Baseline around 0.9.  At time discharge, creatinine 1.6.  On exam, patient slightly tachycardic but otherwise maps appropriate.  Afebrile.  Has some slight pain to palpation in the lower abdomen.  No significant rebound or guarding.   Obtain laboratory workup.  Leukocytosis.  Patient's creatinine is around where he was earlier at time of last discharge.  1.65 today.  Patient did receive 2 L of fluid here in the ED because of the vomiting.  Location pain as well as pain to palpation left lower quadrant, I did obtain CT scan the patient's abdomen.  Wanted to rule out colitis versus diverticulitis versus ileus versus obstruction.  The scan consistent for noncomplicated diverticulitis.  No abscess or perforation.   Patient vital signs remained stable.  Initially tachycardic but more likely due to pain.  After addressing pain as well as fluid, patient's heart rate improved.  She has no allergies to medications start patient on Augmentin .  Strict diet control.  Recommend follow-up with gastroenterology.     Final diagnoses:  Diverticulitis  Lower abdominal pain    ED Discharge Orders          Ordered    amoxicillin -clavulanate (AUGMENTIN ) 875-125 MG tablet  Every 12 hours        05/07/24 1523               Simon Lavonia SAILOR, MD 05/07/24 1540

## 2024-05-07 NOTE — ED Triage Notes (Signed)
 Pt arriving POV for abdominal pain/cramping since Tuesday. Pt reports having some n/v and noticed his urine is dark. Denies any painful urination or difficulty urinating. Rating pain 10/10 at this time.

## 2024-05-11 ENCOUNTER — Other Ambulatory Visit (INDEPENDENT_AMBULATORY_CARE_PROVIDER_SITE_OTHER): Payer: Self-pay | Admitting: Primary Care

## 2024-05-11 NOTE — Telephone Encounter (Unsigned)
 Copied from CRM [id number]. Topic: Clinical - Medication Refill >> May 11, 2024 11:43 AM Winona R wrote: Medication:  amLODipine  (NORVASC ) 5 MG tablet   Has the patient contacted their pharmacy? Yes, advised him to contact This is the patient's preferred pharmacy:  [id number] - [place] ([place]), [place] - [address]  ([place]) [place] [address] Phone: [phone] Fax: [phone]  Is this the correct pharmacy for this prescription? Yes If no, delete pharmacy and type the correct one.   Has the prescription been filled recently? Yes  Is the patient out of the medication? Yes  Has the patient been seen for an appointment in the last year OR does the patient have an upcoming appointment? Yes  Can we respond through MyChart? No  Agent: Please be advised that Rx refills may take up to 3 business days. We ask that you follow-up with your pharmacy.

## 2024-05-13 ENCOUNTER — Telehealth (INDEPENDENT_AMBULATORY_CARE_PROVIDER_SITE_OTHER): Payer: Self-pay

## 2024-05-13 NOTE — Telephone Encounter (Signed)
 Copied from CRM [id number]. Topic: Appointments - Scheduling Inquiry for Clinic >> May 11, 2024 11:41 AM Winona R wrote:  Pt needs a Hospital follow up appt 14 days from the 3rd. Current availability is outside of that time frame. Confirmed with Lead to send CRM

## 2024-05-13 NOTE — Telephone Encounter (Signed)
 Refilled 04/30/24 # 90. Requested Prescriptions  Refused Prescriptions Disp Refills   amLODipine  (NORVASC ) 5 MG tablet 90 tablet 0    Sig: Take 1 tablet (5 mg total) by mouth daily.     Cardiovascular: Calcium Channel Blockers 2 Failed - 05/13/2024  1:24 PM      Failed - Last BP in normal range    BP Readings from Last 1 Encounters:  05/07/24 (!) 136/97         Passed - Last Heart Rate in normal range    Pulse Readings from Last 1 Encounters:  05/07/24 97         Passed - Valid encounter within last 6 months    Recent Outpatient Visits           2 months ago Hospital discharge follow-up   [place] [facility] Celestia Rosaline SQUIBB, NP   4 months ago Chronic gout involving toe of right foot without tophus, unspecified cause   [place] [facility] Celestia Rosaline SQUIBB, NP   5 months ago Influenza vaccination declined   [place] [facility] Celestia Rosaline SQUIBB, NP   11 months ago Essential hypertension   [place] [facility] Celestia Rosaline SQUIBB, NP   1 year ago Essential hypertension   [place] [facility] Celestia Rosaline SQUIBB, NP

## 2024-05-14 NOTE — Telephone Encounter (Signed)
 Patient has an appt scheduled for 05/27/2024 at 10:50 a

## 2024-05-21 ENCOUNTER — Ambulatory Visit (INDEPENDENT_AMBULATORY_CARE_PROVIDER_SITE_OTHER): Admitting: Primary Care

## 2024-05-21 ENCOUNTER — Encounter (INDEPENDENT_AMBULATORY_CARE_PROVIDER_SITE_OTHER): Payer: Self-pay | Admitting: Primary Care

## 2024-05-21 VITALS — BP 150/98 | HR 71 | Resp 16 | Wt 144.4 lb

## 2024-05-21 DIAGNOSIS — Z23 Encounter for immunization: Secondary | ICD-10-CM | POA: Diagnosis not present

## 2024-05-21 DIAGNOSIS — N179 Acute kidney failure, unspecified: Secondary | ICD-10-CM | POA: Diagnosis not present

## 2024-05-21 DIAGNOSIS — R1032 Left lower quadrant pain: Secondary | ICD-10-CM

## 2024-05-21 DIAGNOSIS — I10 Essential (primary) hypertension: Secondary | ICD-10-CM

## 2024-05-21 DIAGNOSIS — Z09 Encounter for follow-up examination after completed treatment for conditions other than malignant neoplasm: Secondary | ICD-10-CM

## 2024-05-21 MED ORDER — CLONIDINE HCL 0.1 MG PO TABS: 0.1000 mg | ORAL_TABLET | Freq: Once | ORAL | Status: AC

## 2024-05-21 MED ORDER — ONDANSETRON HCL 4 MG PO TABS: 4.0000 mg | ORAL_TABLET | Freq: Every day | ORAL | 0 refills | Status: AC | PRN

## 2024-05-21 MED ORDER — AMLODIPINE BESYLATE 10 MG PO TABS: 5.0000 mg | ORAL_TABLET | Freq: Every day | ORAL | 1 refills | Status: DC

## 2024-05-21 NOTE — Progress Notes (Signed)
 Subjective:   [name] is a 48 y.o. male presents for emergency room abdominal pain. He c/o  lower abdominal pain with nausea and vomiting. He admits to bowel movements been normal. No diarrhea. No blood in stool. He receive 2 L of fluid  in the ED because of the vomiting. ED forwarded his visit to his GI doctor. Called [facility] stated was a no show on last appt with Dr. Elicia explained in an out of hospital during that time. Message will be sent to physician for an appt and call pt back directly. Past Medical History:  Diagnosis Date   Gastritis    Hypertension    Polyp of intestine      No Known Allergies  Current Outpatient Medications on File Prior to Visit  Medication Sig Dispense Refill   acetaminophen  (TYLENOL ) 500 MG tablet Take 1 tablet (500 mg total) by mouth every 6 (six) hours as needed. (Patient taking differently: Take 1,000 mg by mouth daily as needed for mild pain (pain score 1-3) or moderate pain (pain score 4-6).) 30 tablet 0   allopurinol  (ZYLOPRIM ) 100 MG tablet Take 1 tablet (100 mg total) by mouth daily. 30 tablet 6   amoxicillin -clavulanate (AUGMENTIN ) 875-125 MG tablet Take 1 tablet by mouth every 12 (twelve) hours. 20 tablet 0   cyclobenzaprine  (FLEXERIL ) 10 MG tablet TAKE 1 TABLET BY MOUTH TWICE DAILY AS NEEDED FOR MUSCLE SPASM (Patient taking differently: Take 10-20 mg by mouth daily as needed for muscle spasms.) 60 tablet 0   pantoprazole  (PROTONIX ) 40 MG tablet Take 1 tablet (40 mg total) by mouth daily. 30 tablet 1   No current facility-administered medications on file prior to visit.    Review of System:  Comprehensive ROS Pertinent positive and negative noted in HPI   Objective:   Physical Exam Vitals reviewed.  Constitutional:      Appearance: Normal appearance.  HENT:     Head: Normocephalic.     Right Ear: External ear normal.     Left Ear: External ear normal.     Nose: Nose normal.     Mouth/Throat:     Mouth: Mucous membranes  are moist.  Eyes:     Extraocular Movements: Extraocular movements intact.  Cardiovascular:     Rate and Rhythm: Normal rate and regular rhythm.  Pulmonary:     Effort: Pulmonary effort is normal.     Breath sounds: Normal breath sounds.  Abdominal:     General: Bowel sounds are normal.     Palpations: Abdomen is soft.     Tenderness: There is abdominal tenderness. There is guarding.  Musculoskeletal:        General: Normal range of motion.     Cervical back: Normal range of motion.  Skin:    General: Skin is warm and dry.  Neurological:     Mental Status: He is alert and oriented to person, place, and time.  Psychiatric:        Mood and Affect: Mood normal.        Behavior: Behavior normal.      Assessment:  Ethaniel was seen today for hospitalization follow-up.  Diagnoses and all orders for this visit:  Hospital discharge follow-up See HPI  Essential hypertension BP goal - < 130/80 Explained that having normal blood pressure is the goal and medications are helping to get to goal and maintain normal blood pressure. DIET: Limit salt intake, read nutrition labels to check salt content, limit fried and high fatty  foods  Avoid using multisymptom OTC cold preparations that generally contain sudafed which can rise BP. Consult with pharmacist on best cold relief products to use for persons with HTN EXERCISE Discussed incorporating exercise such as walking - 30 minutes most days of the week and can do in 10 minute intervals     AKI (acute kidney injury) (HCC) Resolved    Left lower quadrant abdominal pain F/U with GI referral sent to [facility] Dr. Elicia       This note has been created with Dragon speech recognition software and smart phrase technology. Any transcriptional errors are unintentional.   Return in about 2 weeks (around 06/04/2024) for re-check blood pressure.  Rosaline SHAUNNA Bohr, NP 05/21/2024, 3:38 PM

## 2024-05-27 ENCOUNTER — Ambulatory Visit (INDEPENDENT_AMBULATORY_CARE_PROVIDER_SITE_OTHER): Admitting: Primary Care

## 2024-06-07 ENCOUNTER — Emergency Department (HOSPITAL_COMMUNITY)

## 2024-06-07 ENCOUNTER — Emergency Department (HOSPITAL_COMMUNITY)
Admission: EM | Admit: 2024-06-07 | Discharge: 2024-06-07 | Disposition: A | Discharge status: Home / Self Care | Attending: Emergency Medicine | Admitting: Emergency Medicine

## 2024-06-07 ENCOUNTER — Other Ambulatory Visit: Payer: Self-pay

## 2024-06-07 ENCOUNTER — Encounter (HOSPITAL_COMMUNITY): Payer: Self-pay | Admitting: *Deleted

## 2024-06-07 DIAGNOSIS — K5792 Diverticulitis of intestine, part unspecified, without perforation or abscess without bleeding: Secondary | ICD-10-CM | POA: Insufficient documentation

## 2024-06-07 DIAGNOSIS — R935 Abnormal findings on diagnostic imaging of other abdominal regions, including retroperitoneum: Secondary | ICD-10-CM | POA: Diagnosis not present

## 2024-06-07 DIAGNOSIS — R000 Tachycardia, unspecified: Secondary | ICD-10-CM | POA: Diagnosis not present

## 2024-06-07 DIAGNOSIS — R1032 Left lower quadrant pain: Secondary | ICD-10-CM | POA: Diagnosis not present

## 2024-06-07 DIAGNOSIS — K5730 Diverticulosis of large intestine without perforation or abscess without bleeding: Secondary | ICD-10-CM | POA: Diagnosis not present

## 2024-06-07 LAB — COMPREHENSIVE METABOLIC PANEL WITH GFR
ALT: 23 U/L (ref 0–44)
AST: 25 U/L (ref 15–41)
Albumin: 4.7 g/dL (ref 3.5–5.0)
Alkaline Phosphatase: 74 U/L (ref 38–126)
Anion gap: 17 — ABNORMAL HIGH (ref 5–15)
BUN: 33 mg/dL — ABNORMAL HIGH (ref 6–20)
CO2: 23 mmol/L (ref 22–32)
Calcium: 9.7 mg/dL (ref 8.9–10.3)
Chloride: 93 mmol/L — ABNORMAL LOW (ref 98–111)
Creatinine, Ser: 1.85 mg/dL — ABNORMAL HIGH (ref 0.61–1.24)
GFR, Estimated: 45 mL/min — ABNORMAL LOW (ref >60)
Glucose, Bld: 130 mg/dL — ABNORMAL HIGH (ref 70–99)
Potassium: 3.2 mmol/L — ABNORMAL LOW (ref 3.5–5.1)
Sodium: 133 mmol/L — ABNORMAL LOW (ref 135–145)
Total Bilirubin: 1.5 mg/dL — ABNORMAL HIGH (ref 0.0–1.2)
Total Protein: 8.4 g/dL — ABNORMAL HIGH (ref 6.5–8.1)

## 2024-06-07 LAB — CBC WITH DIFFERENTIAL/PLATELET
Abs Granulocyte: 8.9 K/uL — ABNORMAL HIGH (ref 1.5–6.5)
Abs Immature Granulocytes: 0.05 K/uL (ref 0.00–0.07)
Basophils Absolute: 0.1 K/uL (ref 0.0–0.1)
Basophils Relative: 0 %
Eosinophils Absolute: 0.2 K/uL (ref 0.0–0.5)
Eosinophils Relative: 2 %
HCT: 49.8 % (ref 39.0–52.0)
Hemoglobin: 16.9 g/dL (ref 13.0–17.0)
Immature Granulocytes: 0 %
Lymphocytes Relative: 19 %
Lymphs Abs: 2.4 K/uL (ref 0.7–4.0)
MCH: 30.8 pg (ref 26.0–34.0)
MCHC: 33.9 g/dL (ref 30.0–36.0)
MCV: 90.7 fL (ref 80.0–100.0)
Monocytes Absolute: 1.2 K/uL — ABNORMAL HIGH (ref 0.1–1.0)
Monocytes Relative: 9 %
Neutro Abs: 8.9 K/uL — ABNORMAL HIGH (ref 1.7–7.7)
Neutrophils Relative %: 70 %
Platelets: 427 K/uL — ABNORMAL HIGH (ref 150–400)
RBC: 5.49 MIL/uL (ref 4.22–5.81)
RDW: 13.1 % (ref 11.5–15.5)
WBC: 12.9 K/uL — ABNORMAL HIGH (ref 4.0–10.5)
nRBC: 0 % (ref 0.0–0.2)

## 2024-06-07 LAB — LIPASE, BLOOD: Lipase: 35 U/L (ref 11–51)

## 2024-06-07 LAB — ETHANOL: Alcohol, Ethyl (B): <15 mg/dL (ref <15)

## 2024-06-07 MED ORDER — SENNOSIDES-DOCUSATE SODIUM 8.6-50 MG PO TABS: 2.0000 | ORAL_TABLET | Freq: Every day | ORAL | 0 refills | Status: AC

## 2024-06-07 MED ORDER — METRONIDAZOLE 500 MG/100ML IV SOLN
500.0000 mg | Freq: Once | INTRAVENOUS | Status: AC
  Administered 2024-06-07: 500 mg via INTRAVENOUS
  Filled 2024-06-07: qty 100

## 2024-06-07 MED ORDER — METRONIDAZOLE 500 MG PO TABS: 500.0000 mg | ORAL_TABLET | Freq: Two times a day (BID) | ORAL | 0 refills | Status: AC

## 2024-06-07 MED ORDER — METOCLOPRAMIDE HCL 5 MG/ML IJ SOLN
10.0000 mg | Freq: Once | INTRAMUSCULAR | Status: AC
  Administered 2024-06-07: 10 mg via INTRAVENOUS
  Filled 2024-06-07: qty 2

## 2024-06-07 MED ORDER — HYDROCODONE-ACETAMINOPHEN 5-325 MG PO TABS: 1.0000 | ORAL_TABLET | Freq: Four times a day (QID) | ORAL | 0 refills | Status: AC | PRN

## 2024-06-07 MED ORDER — ONDANSETRON HCL 4 MG/2ML IJ SOLN
4.0000 mg | Freq: Once | INTRAMUSCULAR | Status: AC
  Administered 2024-06-07: 4 mg via INTRAVENOUS
  Filled 2024-06-07: qty 2

## 2024-06-07 MED ORDER — CIPROFLOXACIN IN D5W 400 MG/200ML IV SOLN
400.0000 mg | Freq: Once | INTRAVENOUS | Status: AC
  Administered 2024-06-07: 400 mg via INTRAVENOUS
  Filled 2024-06-07: qty 200

## 2024-06-07 MED ORDER — FENTANYL CITRATE PF 50 MCG/ML IJ SOSY
50.0000 ug | PREFILLED_SYRINGE | Freq: Once | INTRAMUSCULAR | Status: AC
  Administered 2024-06-07: 50 ug via INTRAVENOUS
  Filled 2024-06-07: qty 1

## 2024-06-07 MED ORDER — SODIUM CHLORIDE 0.9 % IV BOLUS
1000.0000 mL | Freq: Once | INTRAVENOUS | Status: AC
  Administered 2024-06-07: 1000 mL via INTRAVENOUS

## 2024-06-07 MED ORDER — CIPROFLOXACIN HCL 500 MG PO TABS: 500.0000 mg | ORAL_TABLET | Freq: Two times a day (BID) | ORAL | 0 refills | Status: AC

## 2024-06-07 NOTE — ED Triage Notes (Signed)
 Here by POV from home for abd pain and nv. Onset Thursday. Denies diarrhea, fever, or bleeding. Vomiting resolved. Describes as abd cramping, rates 10/10. Also endorses hot/cold chills, decreased PO intake and feel dehydrated.

## 2024-06-07 NOTE — ED Provider Notes (Signed)
 [place] EMERGENCY DEPARTMENT AT [facility] Provider Note   CSN: [id number] Arrival date & time: 06/07/24  1144     Patient presents with: Abdominal Pain   Victor Johnson is a 48 y.o. male.   HPI Patient presents with left lower quadrant abdominal pain.  Patient has a history of diverticulitis, multiple prior hospitalizations.  He notes over the past 2 days he has developed left lower quadrant abdominal pain, nausea but is worsening.  No vomiting since yesterday but before today he had several episodes over the past few days. No fever.    Prior to Admission medications   Medication Sig Start Date End Date Taking? Authorizing Provider  ciprofloxacin  (CIPRO ) 500 MG tablet Take 1 tablet (500 mg total) by mouth every 12 (twelve) hours for 7 days. 06/07/24 06/14/24 Yes Garrick Charleston, MD  HYDROcodone -acetaminophen  (NORCO/VICODIN) 5-325 MG tablet Take 1 tablet by mouth every 6 (six) hours as needed for severe pain (pain score 7-10). 06/07/24  Yes Garrick Charleston, MD  metroNIDAZOLE  (FLAGYL ) 500 MG tablet Take 1 tablet (500 mg total) by mouth 2 (two) times daily. 06/07/24  Yes Garrick Charleston, MD  senna-docusate (SENOKOT-S) 8.6-50 MG tablet Take 2 tablets by mouth daily for 10 days. 06/07/24 06/17/24 Yes Garrick Charleston, MD  acetaminophen  (TYLENOL ) 500 MG tablet Take 1 tablet (500 mg total) by mouth every 6 (six) hours as needed. Patient taking differently: Take 1,000 mg by mouth daily as needed for mild pain (pain score 1-3) or moderate pain (pain score 4-6). 12/07/23   Remi Pippin, NP  allopurinol  (ZYLOPRIM ) 100 MG tablet Take 1 tablet (100 mg total) by mouth daily. 01/14/24   Celestia Rosaline SQUIBB, NP  amLODipine  (NORVASC ) 10 MG tablet Take 0.5 tablets (5 mg total) by mouth daily. 05/21/24   Celestia Rosaline SQUIBB, NP  amoxicillin -clavulanate (AUGMENTIN ) 875-125 MG tablet Take 1 tablet by mouth every 12 (twelve) hours. 05/07/24   Lemly, Tatum N, MD  cyclobenzaprine  (FLEXERIL ) 10 MG tablet  TAKE 1 TABLET BY MOUTH TWICE DAILY AS NEEDED FOR MUSCLE SPASM Patient taking differently: Take 10-20 mg by mouth daily as needed for muscle spasms. 01/19/24   Celestia Rosaline SQUIBB, NP  ondansetron  (ZOFRAN ) 4 MG tablet Take 1 tablet (4 mg total) by mouth daily as needed for nausea or vomiting. 05/21/24   Celestia Rosaline SQUIBB, NP  pantoprazole  (PROTONIX ) 40 MG tablet Take 1 tablet (40 mg total) by mouth daily. 01/31/24 04/22/24  Drusilla Sabas RAMAN, MD    Allergies: Patient has no known allergies.    Review of Systems  Updated Vital Signs BP (!) 123/90 (BP Location: Right Arm)   Pulse (!) 102   Temp 98.4 F (36.9 C) (Oral)   Resp 18   Wt 65.3 kg   SpO2 100%   BMI 21.90 kg/m   Physical Exam Vitals and nursing note reviewed.  Constitutional:      General: He is not in acute distress.    Appearance: He is well-developed.  HENT:     Head: Normocephalic and atraumatic.  Eyes:     Conjunctiva/sclera: Conjunctivae normal.  Cardiovascular:     Rate and Rhythm: Regular rhythm. Tachycardia present.  Pulmonary:     Effort: Pulmonary effort is normal. No respiratory distress.     Breath sounds: No stridor.  Abdominal:     General: There is no distension.     Tenderness: There is abdominal tenderness in the left lower quadrant.  Skin:    General: Skin is warm and dry.  Neurological:     Mental Status: He is alert and oriented to person, place, and time.     (all labs ordered are listed, but only abnormal results are displayed) Labs Reviewed  COMPREHENSIVE METABOLIC PANEL WITH GFR - Abnormal; Notable for the following components:      Result Value   Sodium 133 (*)    Potassium 3.2 (*)    Chloride 93 (*)    Glucose, Bld 130 (*)    BUN 33 (*)    Creatinine, Ser 1.85 (*)    Total Protein 8.4 (*)    Total Bilirubin 1.5 (*)    GFR, Estimated 45 (*)    Anion gap 17 (*)    All other components within normal limits  CBC WITH DIFFERENTIAL/PLATELET - Abnormal; Notable for the following  components:   WBC 12.9 (*)    Platelets 427 (*)    Neutro Abs 8.9 (*)    Monocytes Absolute 1.2 (*)    Abs Granulocyte 8.9 (*)    All other components within normal limits  LIPASE, BLOOD  ETHANOL  URINALYSIS, ROUTINE W REFLEX MICROSCOPIC    EKG: None  Radiology: CT ABDOMEN PELVIS WO CONTRAST Result Date: 06/07/2024 CLINICAL DATA:  Diverticulitis. Possible complication suspected. Left lower quadrant pain nausea and vomiting. EXAM: CT ABDOMEN AND PELVIS WITHOUT CONTRAST TECHNIQUE: Multidetector CT imaging of the abdomen and pelvis was performed following the standard protocol without IV contrast. RADIATION DOSE REDUCTION: This exam was performed according to the departmental dose-optimization program which includes automated exposure control, adjustment of the mA and/or kV according to patient size and/or use of iterative reconstruction technique. COMPARISON:  05/07/2024, 04/20/2024 and 01/29/2024 as well as 12/30/2023 and 05/01/2023 FINDINGS: Lower chest: Heart is normal size.  Lung bases are clear. Hepatobiliary: Calcified granuloma over the dome of the liver. Liver is otherwise unremarkable. Gallbladder and biliary tree are normal. Pancreas: Normal. Spleen: Normal. Adrenals/Urinary Tract: Adrenal glands are normal. Kidneys are normal size without hydronephrosis or nephrolithiasis. Ureters and bladder are normal. Stomach/Bowel: Stomach and small bowel are normal. Appendix is normal. Diverticulosis throughout the colon most prominent over the distal descending and sigmoid colon. There is very subtle hazy appearance of the pericolonic fat just superior to the sigmoid colon over the left pelvis as this is unchanged from multiple prior exams. Previously seen pericolonic inflammation along the lateral aspect of the sigmoid colon in the left pelvis on 05/07/2024 has resolved. There is no evidence of abscess or perforation. Vascular/Lymphatic: Minimal calcified plaque over the abdominal aorta which is  normal in caliber. Remaining vascular structures are unremarkable. No adenopathy. Reproductive: Normal. Other: No significant free fluid. Musculoskeletal: No focal abnormality. IMPRESSION: 1. Diverticulosis throughout the colon most prominent over the distal descending and sigmoid colon. Previously seen pericolonic inflammation along the lateral aspect of the sigmoid colon in the left pelvis on 05/07/2024 has resolved. Possible subtle active inflammation adjacent the largest diverticula along the superior aspect of the sigmoid colon in the left pelvis. No perforation or abscess. 2. Aortic atherosclerosis. Aortic Atherosclerosis (ICD10-I70.0). Electronically Signed   By: Toribio Agreste M.D.   On: 06/07/2024 14:51     Procedures   Medications Ordered in the ED  sodium chloride  0.9 % bolus 1,000 mL (0 mLs Intravenous Stopped 06/07/24 1510)  ondansetron  (ZOFRAN ) injection 4 mg (4 mg Intravenous Given 06/07/24 1325)  fentaNYL  (SUBLIMAZE ) injection 50 mcg (50 mcg Intravenous Given 06/07/24 1325)  sodium chloride  0.9 % bolus 1,000 mL (1,000 mLs Intravenous New Bag/Given 06/07/24  1446)  ciprofloxacin  (CIPRO ) IVPB 400 mg (0 mg Intravenous Stopped 06/07/24 1621)  metroNIDAZOLE  (FLAGYL ) IVPB 500 mg (0 mg Intravenous Stopped 06/07/24 1619)  metoCLOPramide  (REGLAN ) injection 10 mg (10 mg Intravenous Given 06/07/24 1747)                                    Medical Decision Making Patient with history of diverticulitis, marijuana use presents with nausea, vomiting, abdominal pain.  Differential includes diverticulitis, THC associated nausea, vomiting, abdominal pain, abscess, bacteremia, sepsis. Cardiac 110 sinus tach abnormal pulse ox 100% room air normal  Amount and/or Complexity of Data Reviewed External Data Reviewed: notes.    Details: Hospitalization earlier this month reviewed Labs: ordered. Decision-making details documented in ED Course. Radiology: ordered and independent interpretation performed.  Decision-making details documented in ED Course.  Risk OTC drugs. Prescription drug management. Decision regarding hospitalization. Diagnosis or treatment significantly limited by social determinants of health.   6:10 PM Patient awake, alert, in no distress, speaking clearly.  Labs CT all reviewed, discussed, no evidence for perforation, abscess, diverticulitis is present.  No evidence for bacteremia, sepsis, no pancreatitis, patient has received 2 IV fluid resuscitation boluses, narcotics, analgesics, has improved, will follow-up with GI, will continue antibiotics as an outpatient.    Final diagnoses:  Diverticulitis    ED Discharge Orders          Ordered    ciprofloxacin  (CIPRO ) 500 MG tablet  Every 12 hours        06/07/24 1809    metroNIDAZOLE  (FLAGYL ) 500 MG tablet  2 times daily        06/07/24 1809    HYDROcodone -acetaminophen  (NORCO/VICODIN) 5-325 MG tablet  Every 6 hours PRN        06/07/24 1809    senna-docusate (SENOKOT-S) 8.6-50 MG tablet  Daily        06/07/24 1809               Garrick Charleston, MD 06/07/24 1810

## 2024-06-07 NOTE — ED Notes (Signed)
 Discharge instructions reviewed with patient. Patient questions answered and opportunity for education reviewed. Patient voices understanding of discharge instructions with no further questions. Patient ambulatory with steady gait to lobby.

## 2024-06-07 NOTE — Discharge Instructions (Addendum)
 Be sure to follow-up with your gastroenterology team.  Return here for concerning changes in your condition.

## 2024-06-08 ENCOUNTER — Telehealth: Payer: Self-pay | Admitting: Primary Care

## 2024-06-08 NOTE — Telephone Encounter (Signed)
 Confirmed appt for  8/5

## 2024-06-09 ENCOUNTER — Encounter: Payer: Self-pay | Admitting: Pharmacist

## 2024-06-09 ENCOUNTER — Ambulatory Visit: Attending: Family Medicine | Admitting: Pharmacist

## 2024-06-09 VITALS — BP 110/72

## 2024-06-09 DIAGNOSIS — I10 Essential (primary) hypertension: Secondary | ICD-10-CM

## 2024-06-09 MED ORDER — AMLODIPINE BESYLATE 10 MG PO TABS: 10.0000 mg | ORAL_TABLET | Freq: Every day | ORAL | 1 refills | Status: AC

## 2024-06-09 NOTE — Progress Notes (Signed)
   S:     No chief complaint on file.  48 y.o. male who presents for hypertension evaluation, education, and management.   Patient was referred and last seen by Primary Care Provider Rosaline Bohr, on 05/21/2024. At that visit, BP was 150/98 mmHg. His amlodipine  dose was increased to 10 mg daily.   PMH is significant for HTN, diverticulitis w/ longstanding nausea/vomiting, hx of AKI, tobacco abuse, alcohol use, gout.  Today, patient arrives in good spirits and presents without assistance. Denies dizziness, headache, blurred vision, swelling. Patient reports hypertension is longstanding.   Family/Social history:  Fhx: no pertinent positives  -Tobacco: current 1 PPD smoker  -Alcohol: currently abstaining   Medication adherence reported. Patient reports taking blood pressure medications today.   Current antihypertensives include: amlodipine  10 mg daily (takes two of the 5mg  tablets)  Antihypertensives tried in the past include: amlodipine  5 mg daily, hydrochlorothiazide , valsartan -HCTZ  Reported home blood pressure readings: none  Patient reported dietary habits:  -Not adherent to sodium restriction -Denies drinking excessive caffeine   Patient-reported exercise habits:  -None  O:  Vitals:   06/09/24 1435 06/09/24 1436  BP: 114/79 110/72    Last 3 Office BP readings: BP Readings from Last 3 Encounters:  06/09/24 110/72  06/07/24 (!) 134/102  05/21/24 (!) 150/98   BMET    Component Value Date/Time   NA 133 (L) 06/07/2024 1223   NA 137 07/17/2021 1605   K 3.2 (L) 06/07/2024 1223   CL 93 (L) 06/07/2024 1223   CO2 23 06/07/2024 1223   GLUCOSE 130 (H) 06/07/2024 1223   BUN 33 (H) 06/07/2024 1223   BUN 14 07/17/2021 1605   CREATININE 1.85 (H) 06/07/2024 1223   CALCIUM 9.7 06/07/2024 1223   GFRNONAA 45 (L) 06/07/2024 1223   GFRAA >60 05/24/2020 1029    Renal function: Estimated Creatinine Clearance: 46.1 mL/min (A) (by C-G formula based on SCr of 1.85 mg/dL  (H)).  Clinical ASCVD: No  The ASCVD Risk score (Arnett DK, et al., 2019) failed to calculate for the following reasons:   Cannot find a previous HDL lab   Cannot find a previous total cholesterol lab  Patient is participating in a Managed Medicaid Plan:  Yes    A/P: Hypertension longstanding currently at goal on current medications. BP goal < 130/80 mmHg. Medication adherence appears to be optimal.  -Continued amlodipine  10 mg daily.  -Patient educated on purpose, proper use, and potential adverse effects of amlodipine .  -F/u labs ordered - none today. -Counseled on lifestyle modifications for blood pressure control including reduced dietary sodium, increased exercise, adequate sleep. -Encouraged patient to check BP at home and bring log of readings to next visit. Counseled on proper use of home BP cuff.   Results reviewed and written information provided.    Written patient instructions provided. Patient verbalized understanding of treatment plan. Total time in face to face counseling 20 minutes.    Follow-up:  Pharmacist in 1 month.  Herlene Fleeta Morris, PharmD, JAQUELINE, CPP Clinical Pharmacist [facility] [phone]

## 2024-07-11 ENCOUNTER — Encounter (HOSPITAL_COMMUNITY): Payer: Self-pay

## 2024-07-11 ENCOUNTER — Emergency Department (HOSPITAL_COMMUNITY)
Admission: EM | Admit: 2024-07-11 | Discharge: 2024-07-12 | Disposition: A | Discharge status: Home / Self Care | Attending: Emergency Medicine | Admitting: Emergency Medicine

## 2024-07-11 DIAGNOSIS — I10 Essential (primary) hypertension: Secondary | ICD-10-CM | POA: Insufficient documentation

## 2024-07-11 DIAGNOSIS — R1012 Left upper quadrant pain: Secondary | ICD-10-CM | POA: Insufficient documentation

## 2024-07-11 DIAGNOSIS — R112 Nausea with vomiting, unspecified: Secondary | ICD-10-CM | POA: Insufficient documentation

## 2024-07-11 DIAGNOSIS — E876 Hypokalemia: Secondary | ICD-10-CM | POA: Insufficient documentation

## 2024-07-11 DIAGNOSIS — R197 Diarrhea, unspecified: Secondary | ICD-10-CM | POA: Insufficient documentation

## 2024-07-11 DIAGNOSIS — R1032 Left lower quadrant pain: Secondary | ICD-10-CM | POA: Diagnosis not present

## 2024-07-11 DIAGNOSIS — D72829 Elevated white blood cell count, unspecified: Secondary | ICD-10-CM | POA: Insufficient documentation

## 2024-07-11 DIAGNOSIS — Z79899 Other long term (current) drug therapy: Secondary | ICD-10-CM | POA: Insufficient documentation

## 2024-07-11 NOTE — ED Triage Notes (Signed)
 Pt states that he has been having n/v/d since 430, denies fevers

## 2024-07-12 ENCOUNTER — Emergency Department (HOSPITAL_COMMUNITY)

## 2024-07-12 DIAGNOSIS — K5792 Diverticulitis of intestine, part unspecified, without perforation or abscess without bleeding: Secondary | ICD-10-CM | POA: Diagnosis not present

## 2024-07-12 DIAGNOSIS — R1032 Left lower quadrant pain: Secondary | ICD-10-CM | POA: Diagnosis not present

## 2024-07-12 DIAGNOSIS — K5730 Diverticulosis of large intestine without perforation or abscess without bleeding: Secondary | ICD-10-CM | POA: Diagnosis not present

## 2024-07-12 LAB — CBC
HCT: 46.4 % (ref 39.0–52.0)
Hemoglobin: 15.8 g/dL (ref 13.0–17.0)
MCH: 30 pg (ref 26.0–34.0)
MCHC: 34.1 g/dL (ref 30.0–36.0)
MCV: 88 fL (ref 80.0–100.0)
Platelets: 462 K/uL — ABNORMAL HIGH (ref 150–400)
RBC: 5.27 MIL/uL (ref 4.22–5.81)
RDW: 13.2 % (ref 11.5–15.5)
WBC: 11.4 K/uL — ABNORMAL HIGH (ref 4.0–10.5)
nRBC: 0 % (ref 0.0–0.2)

## 2024-07-12 LAB — COMPREHENSIVE METABOLIC PANEL WITH GFR
ALT: 13 U/L (ref 0–44)
AST: 12 U/L — ABNORMAL LOW (ref 15–41)
Albumin: 4.8 g/dL (ref 3.5–5.0)
Alkaline Phosphatase: 85 U/L (ref 38–126)
Anion gap: 24 — ABNORMAL HIGH (ref 5–15)
BUN: 15 mg/dL (ref 6–20)
CO2: 18 mmol/L — ABNORMAL LOW (ref 22–32)
Calcium: 9.9 mg/dL (ref 8.9–10.3)
Chloride: 100 mmol/L (ref 98–111)
Creatinine, Ser: 1.71 mg/dL — ABNORMAL HIGH (ref 0.61–1.24)
GFR, Estimated: 49 mL/min — ABNORMAL LOW (ref >60)
Glucose, Bld: 156 mg/dL — ABNORMAL HIGH (ref 70–99)
Potassium: 3.1 mmol/L — ABNORMAL LOW (ref 3.5–5.1)
Sodium: 141 mmol/L (ref 135–145)
Total Bilirubin: 0.7 mg/dL (ref 0.0–1.2)
Total Protein: 8.7 g/dL — ABNORMAL HIGH (ref 6.5–8.1)

## 2024-07-12 LAB — LIPASE, BLOOD: Lipase: 40 U/L (ref 11–51)

## 2024-07-12 MED ORDER — ONDANSETRON 4 MG PO TBDP: 4.0000 mg | ORAL_TABLET | Freq: Three times a day (TID) | ORAL | 0 refills | Status: AC | PRN

## 2024-07-12 MED ORDER — POTASSIUM CHLORIDE CRYS ER 20 MEQ PO TBCR
40.0000 meq | EXTENDED_RELEASE_TABLET | Freq: Once | ORAL | Status: AC
  Administered 2024-07-12: 40 meq via ORAL
  Filled 2024-07-12: qty 2

## 2024-07-12 MED ORDER — PROMETHAZINE HCL 25 MG PO TABS: 25.0000 mg | ORAL_TABLET | Freq: Four times a day (QID) | ORAL | 0 refills | Status: DC | PRN

## 2024-07-12 MED ORDER — IOHEXOL 300 MG/ML  SOLN
80.0000 mL | Freq: Once | INTRAMUSCULAR | Status: AC | PRN
  Administered 2024-07-12: 80 mL via INTRAVENOUS

## 2024-07-12 MED ORDER — FENTANYL CITRATE PF 50 MCG/ML IJ SOSY
50.0000 ug | PREFILLED_SYRINGE | Freq: Once | INTRAMUSCULAR | Status: AC
  Administered 2024-07-12: 50 ug via INTRAVENOUS
  Filled 2024-07-12: qty 1

## 2024-07-12 MED ORDER — SODIUM CHLORIDE 0.9 % IV BOLUS
1000.0000 mL | Freq: Once | INTRAVENOUS | Status: AC
  Administered 2024-07-12: 1000 mL via INTRAVENOUS

## 2024-07-12 MED ORDER — DIAZEPAM 5 MG/ML IJ SOLN
2.5000 mg | Freq: Once | INTRAMUSCULAR | Status: AC
  Administered 2024-07-12: 2.5 mg via INTRAVENOUS
  Filled 2024-07-12: qty 2

## 2024-07-12 MED ORDER — PROMETHAZINE HCL 25 MG RE SUPP: 25.0000 mg | Freq: Four times a day (QID) | RECTAL | 0 refills | Status: DC | PRN

## 2024-07-12 MED ORDER — METOCLOPRAMIDE HCL 5 MG/ML IJ SOLN
10.0000 mg | INTRAMUSCULAR | Status: AC
  Administered 2024-07-12: 10 mg via INTRAVENOUS
  Filled 2024-07-12: qty 2

## 2024-07-12 MED ORDER — DICYCLOMINE HCL 20 MG PO TABS: 20.0000 mg | ORAL_TABLET | Freq: Two times a day (BID) | ORAL | 0 refills | Status: AC

## 2024-07-12 MED ORDER — ONDANSETRON HCL 4 MG/2ML IJ SOLN
4.0000 mg | Freq: Once | INTRAMUSCULAR | Status: AC
  Administered 2024-07-12: 4 mg via INTRAVENOUS
  Filled 2024-07-12: qty 2

## 2024-07-12 NOTE — Discharge Instructions (Addendum)
 Take the prescribed medication as directed-- zofran  first line, phenergan  if not working.   Hydrate well, gentle diet for now and advance as tolerated. Follow-up with your primary care doctor. Return to the ED for new or worsening symptoms.

## 2024-07-12 NOTE — ED Provider Notes (Signed)
 [place] EMERGENCY DEPARTMENT AT [facility] Provider Note   CSN: [id number] Arrival date & time: 07/11/24  2348     Patient presents with: Abdominal Pain   Victor Johnson is a 48 y.o. male.   The history is provided by the patient and medical records.  Abdominal Pain Associated symptoms: diarrhea, nausea and vomiting    48 year old male with history of hypertension, alcohol abuse, presenting to the ED for nausea, vomiting, and diarrhea.  States been ongoing since around 4:30 AM this morning.  Has been unable to hold anything down pretty much all day.  Has some increased pain in his left lower abdomen.  Does have a history of diverticulitis and states it feels somewhat similar.  Denies any bloody stools.  He tried taking several over-the-counter medications at home but continued vomiting them.  States he feels weak now.  He has not had any fever or chills.  No prior abdominal surgeries.  Prior to Admission medications   Medication Sig Start Date End Date Taking? Authorizing Provider  acetaminophen  (TYLENOL ) 500 MG tablet Take 1 tablet (500 mg total) by mouth every 6 (six) hours as needed. Patient taking differently: Take 1,000 mg by mouth daily as needed for mild pain (pain score 1-3) or moderate pain (pain score 4-6). 12/07/23   Remi Pippin, NP  allopurinol  (ZYLOPRIM ) 100 MG tablet Take 1 tablet (100 mg total) by mouth daily. 01/14/24   Celestia Rosaline SQUIBB, NP  amLODipine  (NORVASC ) 10 MG tablet Take 1 tablet (10 mg total) by mouth daily. 06/09/24   Newlin, Enobong, MD  amoxicillin -clavulanate (AUGMENTIN ) 875-125 MG tablet Take 1 tablet by mouth every 12 (twelve) hours. 05/07/24   Lemly, Tatum N, MD  cyclobenzaprine  (FLEXERIL ) 10 MG tablet TAKE 1 TABLET BY MOUTH TWICE DAILY AS NEEDED FOR MUSCLE SPASM Patient taking differently: Take 10-20 mg by mouth daily as needed for muscle spasms. 01/19/24   Celestia Rosaline SQUIBB, NP  HYDROcodone -acetaminophen  (NORCO/VICODIN) 5-325 MG tablet Take  1 tablet by mouth every 6 (six) hours as needed for severe pain (pain score 7-10). 06/07/24   Garrick Charleston, MD  metroNIDAZOLE  (FLAGYL ) 500 MG tablet Take 1 tablet (500 mg total) by mouth 2 (two) times daily. 06/07/24   Garrick Charleston, MD  ondansetron  (ZOFRAN ) 4 MG tablet Take 1 tablet (4 mg total) by mouth daily as needed for nausea or vomiting. 05/21/24   Celestia Rosaline SQUIBB, NP  pantoprazole  (PROTONIX ) 40 MG tablet Take 1 tablet (40 mg total) by mouth daily. 01/31/24 04/22/24  Drusilla Sabas RAMAN, MD    Allergies: Patient has no known allergies.    Review of Systems  Gastrointestinal:  Positive for abdominal pain, diarrhea, nausea and vomiting.  All other systems reviewed and are negative.   Updated Vital Signs BP (!) 129/102   Pulse (!) 109   Temp 98.1 F (36.7 C) (Oral)   Resp 18   SpO2 98%   Physical Exam Vitals and nursing note reviewed.  Constitutional:      Appearance: He is well-developed.  HENT:     Head: Normocephalic and atraumatic.  Eyes:     Conjunctiva/sclera: Conjunctivae normal.     Pupils: Pupils are equal, round, and reactive to light.  Cardiovascular:     Rate and Rhythm: Normal rate and regular rhythm.     Heart sounds: Normal heart sounds.  Pulmonary:     Effort: Pulmonary effort is normal.     Breath sounds: Normal breath sounds.  Abdominal:  General: Bowel sounds are normal.     Palpations: Abdomen is soft.     Tenderness: There is abdominal tenderness in the left upper quadrant and left lower quadrant.  Musculoskeletal:        General: Normal range of motion.     Cervical back: Normal range of motion.  Skin:    General: Skin is warm and dry.  Neurological:     Mental Status: He is alert and oriented to person, place, and time.     (all labs ordered are listed, but only abnormal results are displayed) Labs Reviewed  COMPREHENSIVE METABOLIC PANEL WITH GFR - Abnormal; Notable for the following components:      Result Value   Potassium 3.1 (*)     CO2 18 (*)    Glucose, Bld 156 (*)    Creatinine, Ser 1.71 (*)    Total Protein 8.7 (*)    AST 12 (*)    GFR, Estimated 49 (*)    Anion gap 24 (*)    All other components within normal limits  CBC - Abnormal; Notable for the following components:   WBC 11.4 (*)    Platelets 462 (*)    All other components within normal limits  LIPASE, BLOOD  URINALYSIS, ROUTINE W REFLEX MICROSCOPIC    EKG: None  Radiology: CT ABDOMEN PELVIS W CONTRAST Result Date: 07/12/2024 EXAM: CT ABDOMEN AND PELVIS WITH CONTRAST 07/12/2024 01:25:17 AM TECHNIQUE: CT of the abdomen and pelvis was performed with the administration of intravenous contrast. Multiplanar reformatted images are provided for review. Automated exposure control, iterative reconstruction, and/or weight-based adjustment of the mA/kV was utilized to reduce the radiation dose to as low as reasonably achievable. COMPARISON: 01/06/2024 CLINICAL HISTORY: LLQ abdominal pain; hx diverticulitis, N/V/D. FINDINGS: LOWER CHEST: No acute abnormality. LIVER: The liver is unremarkable. GALLBLADDER AND BILE DUCTS: Gallbladder is unremarkable. No biliary ductal dilatation. SPLEEN: No acute abnormality. PANCREAS: No acute abnormality. ADRENAL GLANDS: No acute abnormality. KIDNEYS, URETERS AND BLADDER: No stones in the kidneys or ureters. No hydronephrosis. No perinephric or periureteral stranding. Urinary bladder is unremarkable. GI AND BOWEL: Extensive colonic diverticulosis without evidence of acute diverticulitis. Normal appendix. The colon is nondistended. Question mild mucosal hyperenhancement of the terminal ileum and colon. Evaluation is limited due to nondistention. No pericolonic inflammatory stranding. PERITONEUM AND RETROPERITONEUM: No free intraperitoneal fluid or gas. No abscess. VASCULATURE: Aorta is normal in caliber. LYMPH NODES: No lymphadenopathy. REPRODUCTIVE ORGANS: No acute abnormality. BONES AND SOFT TISSUES: No acute osseous abnormality. No focal  soft tissue abnormality. IMPRESSION: 1. Question enterocolitis of the ileum and colon versus underdistention . 2. Extensive colonic diverticulosis without evidence of acute diverticulitis. Electronically signed by: Norman Gatlin MD 07/12/2024 01:32 AM EDT RP Workstation: [id number]     Procedures   Medications Ordered in the ED  potassium chloride  SA (KLOR-CON  M) CR tablet 40 mEq (has no administration in time range)  fentaNYL  (SUBLIMAZE ) injection 50 mcg (50 mcg Intravenous Given 07/12/24 0034)  ondansetron  (ZOFRAN ) injection 4 mg (4 mg Intravenous Given 07/12/24 0034)  sodium chloride  0.9 % bolus 1,000 mL (0 mLs Intravenous Stopped 07/12/24 0239)  diazepam  (VALIUM ) injection 2.5 mg (2.5 mg Intravenous Given 07/12/24 0045)  iohexol  (OMNIPAQUE ) 300 MG/ML solution 80 mL (80 mLs Intravenous Contrast Given 07/12/24 0126)  metoCLOPramide  (REGLAN ) injection 10 mg (10 mg Intravenous Given 07/12/24 0235)  Medical Decision Making Amount and/or Complexity of Data Reviewed Labs: ordered. Radiology: ordered and independent interpretation performed. ECG/medicine tests: ordered and independent interpretation performed.  Risk Prescription drug management.   48 y.o. M here with N/V/D since 0430.  Also reports some abdominal pain.  Hx of diverticulitis which felt similar.  Afebrile, non-toxic in appearance.  Some tenderness along left abdomen.  Will obtain labs, CT.  Meds and IVF ordered.  Labs as above-- mild leukocytosis at 11.1.  Potassium is mildly low at 3.1, will give oral replacement when tolerating p.o.  normal lipase.  CT with findings of entercolitis but no findings of diverticulitis.  Suspect likely viral in nature.    2:32 AM Attempted PO trial, vomited up ice chips and gingerale.  Will give Reglan .  5:49 AM  Has been able to tolerate ice chips and ginger ale now, states he feels better than time of arrival.  VSS.  Appropriate for discharge.  Symptomatic care  for now, good oral hydration, gentle diet for now and advance as tolerated.  Close PCP follow-up encouraged.  Return here for new concerns.  Final diagnoses:  Nausea vomiting and diarrhea    ED Discharge Orders          Ordered    ondansetron  (ZOFRAN -ODT) 4 MG disintegrating tablet  Every 8 hours PRN        07/12/24 0551    dicyclomine  (BENTYL ) 20 MG tablet  2 times daily        07/12/24 0551    promethazine  (PHENERGAN ) 25 MG suppository  Every 6 hours PRN        07/12/24 0551               Jarold Olam HERO, PA-C 07/12/24 0556    Palumbo, April, MD 07/12/24 [phone]

## 2024-07-12 NOTE — ED Notes (Signed)
 RN is aware of pt's BP; RN at bedside. Pt also stated that he has high BP and has not taken BP medication in a day and a half.

## 2024-07-23 ENCOUNTER — Telehealth: Payer: Self-pay | Admitting: Primary Care

## 2024-07-23 NOTE — Progress Notes (Unsigned)
   S:     No chief complaint on file.  48 y.o. male who presents for hypertension evaluation, education, and management.   Patient was referred and last seen by Primary Care Provider Rosaline Bohr, on 05/21/2024. At that visit, BP was 150/98 mmHg. His amlodipine  dose was increased to 10 mg daily.   PMH is significant for HTN, diverticulitis w/ longstanding nausea/vomiting, hx of AKI, tobacco abuse, alcohol use, gout.  Today, patient arrives in good spirits and presents without assistance. Denies dizziness, headache, blurred vision, swelling. Patient reports hypertension is longstanding.   Family/Social history:  Fhx: no pertinent positives  -Tobacco: current 1 PPD smoker  -Alcohol: currently abstaining   Medication adherence reported. Patient reports taking blood pressure medications today.   Current antihypertensives include: amlodipine  10 mg daily (takes two of the 5mg  tablets)  Antihypertensives tried in the past include: amlodipine  5 mg daily, hydrochlorothiazide , valsartan -HCTZ  Reported home blood pressure readings: none  Patient reported dietary habits:  -Not adherent to sodium restriction -Denies drinking excessive caffeine   Patient-reported exercise habits:  -None  O:  There were no vitals filed for this visit.   Last 3 Office BP readings: BP Readings from Last 3 Encounters:  07/12/24 (!) 147/100  06/09/24 110/72  06/07/24 (!) 134/102   BMET    Component Value Date/Time   NA 141 07/11/2024 2356   NA 137 07/17/2021 1605   K 3.1 (L) 07/11/2024 2356   CL 100 07/11/2024 2356   CO2 18 (L) 07/11/2024 2356   GLUCOSE 156 (H) 07/11/2024 2356   BUN 15 07/11/2024 2356   BUN 14 07/17/2021 1605   CREATININE 1.71 (H) 07/11/2024 2356   CALCIUM 9.9 07/11/2024 2356   GFRNONAA 49 (L) 07/11/2024 2356   GFRAA >60 05/24/2020 1029    Renal function: CrCl cannot be calculated (Unknown ideal weight.).  Clinical ASCVD: No  The ASCVD Risk score (Arnett DK, et al.,  2019) failed to calculate for the following reasons:   Cannot find a previous HDL lab   Cannot find a previous total cholesterol lab  Patient is participating in a Managed Medicaid Plan:  Yes    A/P: Hypertension longstanding currently at goal on current medications. BP goal < 130/80 mmHg. Medication adherence appears to be optimal.  -Continued amlodipine  10 mg daily.  -Patient educated on purpose, proper use, and potential adverse effects of amlodipine .  -F/u labs ordered - none today. -Counseled on lifestyle modifications for blood pressure control including reduced dietary sodium, increased exercise, adequate sleep. -Encouraged patient to check BP at home and bring log of readings to next visit. Counseled on proper use of home BP cuff.   Results reviewed and written information provided.    Written patient instructions provided. Patient verbalized understanding of treatment plan. Total time in face to face counseling 20 minutes.    Follow-up:  Pharmacist in 1 month.  Patient seen with: Tinnie Norrie, PharmD Candidate   Herlene Fleeta Morris, PharmD, BCACP, CPP Clinical Pharmacist [facility] [phone]

## 2024-07-23 NOTE — Telephone Encounter (Signed)
 Confirmed appt for 9/19

## 2024-07-24 ENCOUNTER — Ambulatory Visit: Admitting: Pharmacist

## 2024-08-14 DIAGNOSIS — K219 Gastro-esophageal reflux disease without esophagitis: Secondary | ICD-10-CM | POA: Diagnosis not present

## 2024-08-14 DIAGNOSIS — K449 Diaphragmatic hernia without obstruction or gangrene: Secondary | ICD-10-CM | POA: Diagnosis not present

## 2024-08-14 DIAGNOSIS — K2289 Other specified disease of esophagus: Secondary | ICD-10-CM | POA: Diagnosis not present

## 2024-08-14 DIAGNOSIS — K3189 Other diseases of stomach and duodenum: Secondary | ICD-10-CM | POA: Diagnosis not present

## 2024-08-30 ENCOUNTER — Encounter (HOSPITAL_COMMUNITY): Payer: Self-pay | Admitting: Emergency Medicine

## 2024-08-30 ENCOUNTER — Emergency Department (HOSPITAL_COMMUNITY)
Admission: EM | Admit: 2024-08-30 | Discharge: 2024-08-30 | Disposition: A | Discharge status: Home / Self Care | Attending: Emergency Medicine | Admitting: Emergency Medicine

## 2024-08-30 ENCOUNTER — Emergency Department (HOSPITAL_COMMUNITY)

## 2024-08-30 ENCOUNTER — Other Ambulatory Visit: Payer: Self-pay

## 2024-08-30 DIAGNOSIS — Z720 Tobacco use: Secondary | ICD-10-CM | POA: Insufficient documentation

## 2024-08-30 DIAGNOSIS — R109 Unspecified abdominal pain: Secondary | ICD-10-CM | POA: Diagnosis not present

## 2024-08-30 DIAGNOSIS — I10 Essential (primary) hypertension: Secondary | ICD-10-CM | POA: Diagnosis not present

## 2024-08-30 DIAGNOSIS — R935 Abnormal findings on diagnostic imaging of other abdominal regions, including retroperitoneum: Secondary | ICD-10-CM | POA: Diagnosis not present

## 2024-08-30 DIAGNOSIS — R112 Nausea with vomiting, unspecified: Secondary | ICD-10-CM

## 2024-08-30 DIAGNOSIS — Z79899 Other long term (current) drug therapy: Secondary | ICD-10-CM | POA: Insufficient documentation

## 2024-08-30 DIAGNOSIS — K529 Noninfective gastroenteritis and colitis, unspecified: Secondary | ICD-10-CM | POA: Diagnosis not present

## 2024-08-30 DIAGNOSIS — K5710 Diverticulosis of small intestine without perforation or abscess without bleeding: Secondary | ICD-10-CM | POA: Diagnosis not present

## 2024-08-30 DIAGNOSIS — K449 Diaphragmatic hernia without obstruction or gangrene: Secondary | ICD-10-CM | POA: Diagnosis not present

## 2024-08-30 LAB — COMPREHENSIVE METABOLIC PANEL WITH GFR
ALT: 16 U/L (ref 0–44)
AST: 14 U/L — ABNORMAL LOW (ref 15–41)
Albumin: 5 g/dL (ref 3.5–5.0)
Alkaline Phosphatase: 72 U/L (ref 38–126)
Anion gap: 14 (ref 5–15)
BUN: 12 mg/dL (ref 6–20)
CO2: 26 mmol/L (ref 22–32)
Calcium: 9.9 mg/dL (ref 8.9–10.3)
Chloride: 103 mmol/L (ref 98–111)
Creatinine, Ser: 1.04 mg/dL (ref 0.61–1.24)
GFR, Estimated: >60 mL/min (ref >60)
Glucose, Bld: 138 mg/dL — ABNORMAL HIGH (ref 70–99)
Potassium: 3.3 mmol/L — ABNORMAL LOW (ref 3.5–5.1)
Sodium: 145 mmol/L (ref 135–145)
Total Bilirubin: 1.1 mg/dL (ref 0.0–1.2)
Total Protein: 8.4 g/dL — ABNORMAL HIGH (ref 6.5–8.1)

## 2024-08-30 LAB — CBC
HCT: 47.4 % (ref 39.0–52.0)
Hemoglobin: 15.4 g/dL (ref 13.0–17.0)
MCH: 30.2 pg (ref 26.0–34.0)
MCHC: 32.5 g/dL (ref 30.0–36.0)
MCV: 92.9 fL (ref 80.0–100.0)
Platelets: 402 K/uL — ABNORMAL HIGH (ref 150–400)
RBC: 5.1 MIL/uL (ref 4.22–5.81)
RDW: 14.6 % (ref 11.5–15.5)
WBC: 10.2 K/uL (ref 4.0–10.5)
nRBC: 0 % (ref 0.0–0.2)

## 2024-08-30 LAB — LIPASE, BLOOD: Lipase: 64 U/L — ABNORMAL HIGH (ref 11–51)

## 2024-08-30 MED ORDER — MORPHINE SULFATE (PF) 4 MG/ML IV SOLN
4.0000 mg | Freq: Once | INTRAVENOUS | Status: AC
  Administered 2024-08-30: 4 mg via INTRAVENOUS
  Filled 2024-08-30: qty 1

## 2024-08-30 MED ORDER — ONDANSETRON HCL 4 MG/2ML IJ SOLN
4.0000 mg | Freq: Once | INTRAMUSCULAR | Status: AC
  Administered 2024-08-30: 4 mg via INTRAVENOUS
  Filled 2024-08-30: qty 2

## 2024-08-30 MED ORDER — PROMETHAZINE HCL 25 MG/ML IJ SOLN
12.5000 mg | Freq: Once | INTRAMUSCULAR | Status: AC
  Administered 2024-08-30: 12.5 mg via INTRAVENOUS
  Filled 2024-08-30: qty 12.5

## 2024-08-30 MED ORDER — SODIUM CHLORIDE 0.9 % IV BOLUS
1000.0000 mL | Freq: Once | INTRAVENOUS | Status: AC
  Administered 2024-08-30: 1000 mL via INTRAVENOUS

## 2024-08-30 MED ORDER — PROMETHAZINE HCL 25 MG PO TABS: 25.0000 mg | ORAL_TABLET | Freq: Four times a day (QID) | ORAL | 0 refills | Status: DC | PRN

## 2024-08-30 MED ORDER — IOHEXOL 300 MG/ML  SOLN
100.0000 mL | Freq: Once | INTRAMUSCULAR | Status: AC | PRN
  Administered 2024-08-30: 100 mL via INTRAVENOUS

## 2024-08-30 NOTE — Discharge Instructions (Signed)
 Please drink plenty of fluids including electrolyte containing fluids, such as pedialyte, gatorade to help with dehydration.  I would stick to a bland diet until your symptoms resolve, slowly introduce your regular diet as tolerated.  You can use the stronger nausea medication, up to every 6 hours as needed for nausea and vomiting if the zofran  you are taking is not effective.

## 2024-08-30 NOTE — ED Provider Notes (Signed)
 [place] EMERGENCY DEPARTMENT AT [facility] Provider Note   CSN: [id number] Arrival date & time: 08/30/24  1113     Patient presents with: Abdominal Pain   Victor Johnson is a 48 y.o. male with past medical history significant for hypertension, tobacco use, cannabis use who presents concern for abdominal pain, vomiting, diarrhea.  He denies fever, chills.  He endorses a history of gastritis, diverticulitis.  He reports the pain feels somewhat similar.  Worse in the left lower quadrant.  He denies any recent alcohol use.  He reports he tried home Zofran  with no improvement of symptoms.    Abdominal Pain      Prior to Admission medications   Medication Sig Start Date End Date Taking? Authorizing Provider  promethazine  (PHENERGAN ) 25 MG tablet Take 1 tablet (25 mg total) by mouth every 6 (six) hours as needed for nausea or vomiting. 08/30/24  Yes Donique Hammonds H, PA-C  acetaminophen  (TYLENOL ) 500 MG tablet Take 1 tablet (500 mg total) by mouth every 6 (six) hours as needed. Patient taking differently: Take 1,000 mg by mouth daily as needed for mild pain (pain score 1-3) or moderate pain (pain score 4-6). 12/07/23   Remi Pippin, NP  allopurinol  (ZYLOPRIM ) 100 MG tablet Take 1 tablet (100 mg total) by mouth daily. 01/14/24   Celestia Rosaline SQUIBB, NP  amLODipine  (NORVASC ) 10 MG tablet Take 1 tablet (10 mg total) by mouth daily. 06/09/24   Newlin, Enobong, MD  amoxicillin -clavulanate (AUGMENTIN ) 875-125 MG tablet Take 1 tablet by mouth every 12 (twelve) hours. 05/07/24   Lemly, Tatum N, MD  cyclobenzaprine  (FLEXERIL ) 10 MG tablet TAKE 1 TABLET BY MOUTH TWICE DAILY AS NEEDED FOR MUSCLE SPASM Patient taking differently: Take 10-20 mg by mouth daily as needed for muscle spasms. 01/19/24   Celestia Rosaline SQUIBB, NP  dicyclomine  (BENTYL ) 20 MG tablet Take 1 tablet (20 mg total) by mouth 2 (two) times daily. 07/12/24   Jarold Olam HERO, PA-C  HYDROcodone -acetaminophen  (NORCO/VICODIN) 5-325  MG tablet Take 1 tablet by mouth every 6 (six) hours as needed for severe pain (pain score 7-10). 06/07/24   Garrick Charleston, MD  metroNIDAZOLE  (FLAGYL ) 500 MG tablet Take 1 tablet (500 mg total) by mouth 2 (two) times daily. 06/07/24   Garrick Charleston, MD  ondansetron  (ZOFRAN ) 4 MG tablet Take 1 tablet (4 mg total) by mouth daily as needed for nausea or vomiting. 05/21/24   Celestia Rosaline SQUIBB, NP  ondansetron  (ZOFRAN -ODT) 4 MG disintegrating tablet Take 1 tablet (4 mg total) by mouth every 8 (eight) hours as needed. 07/12/24   Jarold Olam HERO, PA-C  pantoprazole  (PROTONIX ) 40 MG tablet Take 1 tablet (40 mg total) by mouth daily. 01/31/24 04/22/24  Drusilla Sabas RAMAN, MD    Allergies: Patient has no known allergies.    Review of Systems  Gastrointestinal:  Positive for abdominal pain.  All other systems reviewed and are negative.   Updated Vital Signs BP (!) 158/104   Pulse 66   Temp 98.6 F (37 C) (Oral)   Resp 17   SpO2 97%   Physical Exam Vitals and nursing note reviewed.  Constitutional:      General: He is not in acute distress.    Appearance: Normal appearance.  HENT:     Head: Normocephalic and atraumatic.  Eyes:     General:        Right eye: No discharge.        Left eye: No discharge.  Cardiovascular:  Rate and Rhythm: Normal rate and regular rhythm.     Heart sounds: No murmur heard.    No friction rub. No gallop.  Pulmonary:     Effort: Pulmonary effort is normal.     Breath sounds: Normal breath sounds.  Abdominal:     General: Bowel sounds are normal.     Palpations: Abdomen is soft.     Comments: Focal ttp in llq, no rebound, rigidity, guarding  Skin:    General: Skin is warm and dry.     Capillary Refill: Capillary refill takes less than 2 seconds.  Neurological:     Mental Status: He is alert and oriented to person, place, and time.  Psychiatric:        Mood and Affect: Mood normal.        Behavior: Behavior normal.     (all labs ordered are listed, but  only abnormal results are displayed) Labs Reviewed  LIPASE, BLOOD - Abnormal; Notable for the following components:      Result Value   Lipase 64 (*)    All other components within normal limits  COMPREHENSIVE METABOLIC PANEL WITH GFR - Abnormal; Notable for the following components:   Potassium 3.3 (*)    Glucose, Bld 138 (*)    Total Protein 8.4 (*)    AST 14 (*)    All other components within normal limits  CBC - Abnormal; Notable for the following components:   Platelets 402 (*)    All other components within normal limits  URINALYSIS, ROUTINE W REFLEX MICROSCOPIC    EKG: None  Radiology: CT ABDOMEN PELVIS W CONTRAST Result Date: 08/30/2024 CLINICAL DATA:  Acute abdominal pain. EXAM: CT ABDOMEN AND PELVIS WITH CONTRAST TECHNIQUE: Multidetector CT imaging of the abdomen and pelvis was performed using the standard protocol following bolus administration of intravenous contrast. RADIATION DOSE REDUCTION: This exam was performed according to the departmental dose-optimization program which includes automated exposure control, adjustment of the mA and/or kV according to patient size and/or use of iterative reconstruction technique. CONTRAST:  OMNIPAQUE  IOHEXOL  300 MG/ML  SOLN COMPARISON:  Multiple prior exams, most recently 07/12/2024. FINDINGS: Lower chest: No basilar airspace disease or pleural effusion. Small hiatal hernia with wall thickening of the distal esophagus. Hepatobiliary: Calcified granuloma in the dome of the liver. No suspicious liver lesion. Gallbladder physiologically distended, no calcified stone. No biliary dilatation. Pancreas: No ductal dilatation or inflammation. Spleen: Normal in size without focal abnormality. Adrenals/Urinary Tract: No adrenal nodule. No hydronephrosis, renal calculus or renal inflammation. Unremarkable urinary bladder. Stomach/Bowel: Bowel assessment is limited in the absence of enteric contrast and paucity of intra-abdominal fat. There is mild  wall thickening about the proximal sigmoid colon, series 2, image 62. Multiple diverticula in this region, however no discretely inflamed diverticulum. No small bowel dilatation or evidence of obstruction. Small hiatal hernia with wall thickening of the distal esophagus. Tiny duodenal diverticulum. Normal appendix. Vascular/Lymphatic: Normal caliber abdominal aorta. No abdominopelvic adenopathy. Reproductive: Prostate is unremarkable. Other: No free air, free fluid, or intra-abdominal fluid collection. Musculoskeletal: There are no acute or suspicious osseous abnormalities. IMPRESSION: 1. Mild wall thickening about the proximal sigmoid colon, suspicious for colitis. Multiple diverticula in this region, however no discretely inflamed diverticulum. 2. Small hiatal hernia with wall thickening of the distal esophagus, can be seen with reflux or esophagitis. Electronically Signed   By: Andrea Gasman M.D.   On: 08/30/2024 13:16     Procedures   Medications Ordered in the ED  morphine  (PF) 4 MG/ML injection 4 mg (4 mg Intravenous Given 08/30/24 1155)  sodium chloride  0.9 % bolus 1,000 mL (0 mLs Intravenous Stopped 08/30/24 1251)  ondansetron  (ZOFRAN ) injection 4 mg (4 mg Intravenous Given 08/30/24 1156)  iohexol  (OMNIPAQUE ) 300 MG/ML solution 100 mL (100 mLs Intravenous Contrast Given 08/30/24 1308)  promethazine  (PHENERGAN ) 12.5 mg in sodium chloride  0.9 % 50 mL IVPB (0 mg Intravenous Stopped 08/30/24 1446)                                    Medical Decision Making Amount and/or Complexity of Data Reviewed Labs: ordered. Radiology: ordered.  Risk Prescription drug management.   This patient is a 48 y.o. male  who presents to the ED for concern of abdominal pain, nausea, vomiting, diarrhea.   Differential diagnoses prior to evaluation: The emergent differential diagnosis includes, but is not limited to,  The causes of generalized abdominal pain include but are not limited to AAA, mesenteric  ischemia, appendicitis, diverticulitis, DKA, gastritis, gastroenteritis, AMI, nephrolithiasis, pancreatitis, peritonitis, adrenal insufficiency,lead poisoning, iron toxicity, intestinal ischemia, constipation, UTI,SBO/LBO, splenic rupture, biliary disease, IBD, IBS, PUD, or hepatitis . This is not an exhaustive differential.   Past Medical History / Co-morbidities / Social History: hypertension, tobacco use, cannabis use  Additional history: Chart reviewed. Pertinent results include: Reviewed lab work, imaging from previous emergency department visits, notably with multiple previous visits for nausea, vomiting, diarrhea, as well as a few diagnoses of diverticulitis in the past.  Physical Exam: Physical exam performed. The pertinent findings include: Some diffuse abdominal tenderness with focal left lower quadrant tenderness, no rebound, rigidity, guarding.  Lab Tests/Imaging studies: I personally interpreted labs/imaging and the pertinent results include: CBC overall unremarkable, mildly elevated platelets of 402.  May be secondary to pain.  CMP with mild hypokalemia, potassium 3.3, otherwise overall unremarkable.  Lipase very minimally elevated at 64, not 3 times the upper limit of normal, no focal epigastric tenderness, low clinical suspicion for pancreatitis.  CT abdomen pelvis with contrast with some sigmoid colon thickening suspicious for colitis with no diverticular inflammation.  No abscess or other surgical abnormality.. I agree with the radiologist interpretation.  Medications: I ordered medication including morphine , Zofran , fluid bolus, on reevaluation patient still having some ongoing vomiting, will give IV Phenergan  and reassess, able to tolerate PO, stable for discharge with phenergan  rx for home.  I have reviewed the patients home medicines and have made adjustments as needed.   Disposition: After consideration of the diagnostic results and the patients response to treatment, I feel  that patient with colitis without diverticulitis, no white blood cell count, vital signs stable other than some hypertension, tolerating p.o. at time of discharge.  Encouraged bland diet, plenty of fluids, and follow-up with GI.  emergency department workup does not suggest an emergent condition requiring admission or immediate intervention beyond what has been performed at this time. The plan is: as above. The patient is safe for discharge and has been instructed to return immediately for worsening symptoms, change in symptoms or any other concerns.   Final diagnoses:  Colitis  Nausea vomiting and diarrhea    ED Discharge Orders          Ordered    promethazine  (PHENERGAN ) 25 MG tablet  Every 6 hours PRN        08/30/24 1502  Rosan Sherlean DEL, PA-C 08/30/24 1513    Francesca Elsie CROME, MD 08/30/24 1553

## 2024-08-30 NOTE — ED Triage Notes (Signed)
 Pt reports generalized abdominal pain and vomiting that started yesterday. Denies fevers. Reports he has a hx of same.

## 2024-09-07 ENCOUNTER — Encounter: Payer: Self-pay | Admitting: Radiology

## 2024-09-30 ENCOUNTER — Emergency Department (HOSPITAL_COMMUNITY)

## 2024-09-30 ENCOUNTER — Emergency Department (HOSPITAL_COMMUNITY): Admission: EM | Admit: 2024-09-30 | Discharge: 2024-09-30 | Disposition: A | Discharge status: Home / Self Care

## 2024-09-30 ENCOUNTER — Other Ambulatory Visit: Payer: Self-pay

## 2024-09-30 ENCOUNTER — Encounter (HOSPITAL_COMMUNITY): Payer: Self-pay

## 2024-09-30 DIAGNOSIS — R109 Unspecified abdominal pain: Secondary | ICD-10-CM | POA: Diagnosis not present

## 2024-09-30 DIAGNOSIS — D72829 Elevated white blood cell count, unspecified: Secondary | ICD-10-CM | POA: Insufficient documentation

## 2024-09-30 DIAGNOSIS — Z720 Tobacco use: Secondary | ICD-10-CM | POA: Diagnosis not present

## 2024-09-30 DIAGNOSIS — R1032 Left lower quadrant pain: Secondary | ICD-10-CM | POA: Diagnosis not present

## 2024-09-30 DIAGNOSIS — Z79899 Other long term (current) drug therapy: Secondary | ICD-10-CM | POA: Diagnosis not present

## 2024-09-30 DIAGNOSIS — I10 Essential (primary) hypertension: Secondary | ICD-10-CM | POA: Insufficient documentation

## 2024-09-30 DIAGNOSIS — R112 Nausea with vomiting, unspecified: Secondary | ICD-10-CM | POA: Diagnosis not present

## 2024-09-30 DIAGNOSIS — K5730 Diverticulosis of large intestine without perforation or abscess without bleeding: Secondary | ICD-10-CM | POA: Diagnosis not present

## 2024-09-30 DIAGNOSIS — K449 Diaphragmatic hernia without obstruction or gangrene: Secondary | ICD-10-CM | POA: Diagnosis not present

## 2024-09-30 LAB — COMPREHENSIVE METABOLIC PANEL WITH GFR
ALT: 10 U/L (ref 0–44)
AST: 12 U/L — ABNORMAL LOW (ref 15–41)
Albumin: 4.9 g/dL (ref 3.5–5.0)
Alkaline Phosphatase: 76 U/L (ref 38–126)
Anion gap: 16 — ABNORMAL HIGH (ref 5–15)
BUN: 15 mg/dL (ref 6–20)
CO2: 28 mmol/L (ref 22–32)
Calcium: 10 mg/dL (ref 8.9–10.3)
Chloride: 100 mmol/L (ref 98–111)
Creatinine, Ser: 1.13 mg/dL (ref 0.61–1.24)
GFR, Estimated: >60 mL/min (ref >60)
Glucose, Bld: 134 mg/dL — ABNORMAL HIGH (ref 70–99)
Potassium: 3.4 mmol/L — ABNORMAL LOW (ref 3.5–5.1)
Sodium: 144 mmol/L (ref 135–145)
Total Bilirubin: 0.9 mg/dL (ref 0.0–1.2)
Total Protein: 8.3 g/dL — ABNORMAL HIGH (ref 6.5–8.1)

## 2024-09-30 LAB — CBC
HCT: 43.6 % (ref 39.0–52.0)
Hemoglobin: 15 g/dL (ref 13.0–17.0)
MCH: 31.4 pg (ref 26.0–34.0)
MCHC: 34.4 g/dL (ref 30.0–36.0)
MCV: 91.4 fL (ref 80.0–100.0)
Platelets: 392 K/uL (ref 150–400)
RBC: 4.77 MIL/uL (ref 4.22–5.81)
RDW: 14.3 % (ref 11.5–15.5)
WBC: 10.8 K/uL — ABNORMAL HIGH (ref 4.0–10.5)
nRBC: 0 % (ref 0.0–0.2)

## 2024-09-30 LAB — URINALYSIS, ROUTINE W REFLEX MICROSCOPIC
Bacteria, UA: NONE SEEN
Bilirubin Urine: NEGATIVE
Glucose, UA: NEGATIVE mg/dL
Hgb urine dipstick: NEGATIVE
Ketones, ur: 20 mg/dL — AB
Leukocytes,Ua: NEGATIVE
Nitrite: NEGATIVE
Protein, ur: 100 mg/dL — AB
Specific Gravity, Urine: <=1.005 (ref 1.005–1.030)
pH: 6 (ref 5.0–8.0)

## 2024-09-30 LAB — I-STAT CHEM 8, ED
BUN: 16 mg/dL (ref 6–20)
Calcium, Ion: 1.1 mmol/L — ABNORMAL LOW (ref 1.15–1.40)
Chloride: 101 mmol/L (ref 98–111)
Creatinine, Ser: 1.2 mg/dL (ref 0.61–1.24)
Glucose, Bld: 135 mg/dL — ABNORMAL HIGH (ref 70–99)
HCT: 46 % (ref 39.0–52.0)
Hemoglobin: 15.6 g/dL (ref 13.0–17.0)
Potassium: 3.2 mmol/L — ABNORMAL LOW (ref 3.5–5.1)
Sodium: 143 mmol/L (ref 135–145)
TCO2: 29 mmol/L (ref 22–32)

## 2024-09-30 LAB — LIPASE, BLOOD: Lipase: 24 U/L (ref 11–51)

## 2024-09-30 MED ORDER — PROMETHAZINE HCL 25 MG RE SUPP: 25.0000 mg | Freq: Four times a day (QID) | RECTAL | 0 refills | Status: AC | PRN

## 2024-09-30 MED ORDER — IOHEXOL 300 MG/ML  SOLN
100.0000 mL | Freq: Once | INTRAMUSCULAR | Status: AC | PRN
  Administered 2024-09-30: 100 mL via INTRAVENOUS

## 2024-09-30 MED ORDER — POTASSIUM CHLORIDE CRYS ER 20 MEQ PO TBCR
40.0000 meq | EXTENDED_RELEASE_TABLET | Freq: Once | ORAL | Status: AC
  Administered 2024-09-30: 40 meq via ORAL
  Filled 2024-09-30: qty 2

## 2024-09-30 MED ORDER — ONDANSETRON HCL 4 MG/2ML IJ SOLN
4.0000 mg | Freq: Once | INTRAMUSCULAR | Status: AC
  Administered 2024-09-30: 4 mg via INTRAVENOUS
  Filled 2024-09-30: qty 2

## 2024-09-30 MED ORDER — MORPHINE SULFATE (PF) 4 MG/ML IV SOLN
4.0000 mg | Freq: Once | INTRAVENOUS | Status: AC
  Administered 2024-09-30: 4 mg via INTRAVENOUS
  Filled 2024-09-30: qty 1

## 2024-09-30 MED ORDER — SODIUM CHLORIDE 0.9 % IV BOLUS
1000.0000 mL | Freq: Once | INTRAVENOUS | Status: AC
  Administered 2024-09-30: 1000 mL via INTRAVENOUS

## 2024-09-30 MED ORDER — SODIUM CHLORIDE 0.9 % IV SOLN
12.5000 mg | Freq: Once | INTRAVENOUS | Status: AC
  Administered 2024-09-30: 12.5 mg via INTRAVENOUS
  Filled 2024-09-30: qty 12.5

## 2024-09-30 NOTE — ED Provider Notes (Signed)
 [place] EMERGENCY DEPARTMENT AT [facility] Provider Note   CSN: [id number] Arrival date & time: 09/30/24  9060     Patient presents with: Nausea and Emesis   Victor Johnson is a 48 y.o. male.   Patient with history of hypertension, cannabis use, tobacco use presents today with complaints of nausea and vomiting.  Reports that symptoms began yesterday morning and have been persistent since then.  Reports he is unable to tolerate any oral intake due to continued nausea and vomiting.  Also reports he is having abdominal pain particularly in the left lower quadrant.  Does have a history of diverticulitis and reports this feels similar.  Denies any fevers or chills.  No hematochezia or melena.  Has been taking Zofran  which he has been prescribed previously without significant improvement in symptoms.  The history is provided by the patient. No language interpreter was used.  Emesis Associated symptoms: abdominal pain        Prior to Admission medications   Medication Sig Start Date End Date Taking? Authorizing Provider  acetaminophen  (TYLENOL ) 500 MG tablet Take 1 tablet (500 mg total) by mouth every 6 (six) hours as needed. Patient taking differently: Take 1,000 mg by mouth daily as needed for mild pain (pain score 1-3) or moderate pain (pain score 4-6). 12/07/23   Remi Pippin, NP  allopurinol  (ZYLOPRIM ) 100 MG tablet Take 1 tablet (100 mg total) by mouth daily. 01/14/24   Celestia Rosaline SQUIBB, NP  amLODipine  (NORVASC ) 10 MG tablet Take 1 tablet (10 mg total) by mouth daily. 06/09/24   Newlin, Enobong, MD  amoxicillin -clavulanate (AUGMENTIN ) 875-125 MG tablet Take 1 tablet by mouth every 12 (twelve) hours. 05/07/24   Lemly, Tatum N, MD  cyclobenzaprine  (FLEXERIL ) 10 MG tablet TAKE 1 TABLET BY MOUTH TWICE DAILY AS NEEDED FOR MUSCLE SPASM Patient taking differently: Take 10-20 mg by mouth daily as needed for muscle spasms. 01/19/24   Celestia Rosaline SQUIBB, NP  dicyclomine  (BENTYL ) 20  MG tablet Take 1 tablet (20 mg total) by mouth 2 (two) times daily. 07/12/24   Jarold Olam HERO, PA-C  HYDROcodone -acetaminophen  (NORCO/VICODIN) 5-325 MG tablet Take 1 tablet by mouth every 6 (six) hours as needed for severe pain (pain score 7-10). 06/07/24   Garrick Charleston, MD  metroNIDAZOLE  (FLAGYL ) 500 MG tablet Take 1 tablet (500 mg total) by mouth 2 (two) times daily. 06/07/24   Garrick Charleston, MD  ondansetron  (ZOFRAN ) 4 MG tablet Take 1 tablet (4 mg total) by mouth daily as needed for nausea or vomiting. 05/21/24   Celestia Rosaline SQUIBB, NP  ondansetron  (ZOFRAN -ODT) 4 MG disintegrating tablet Take 1 tablet (4 mg total) by mouth every 8 (eight) hours as needed. 07/12/24   Jarold Olam HERO, PA-C  pantoprazole  (PROTONIX ) 40 MG tablet Take 1 tablet (40 mg total) by mouth daily. 01/31/24 04/22/24  Drusilla Sabas RAMAN, MD  promethazine  (PHENERGAN ) 25 MG tablet Take 1 tablet (25 mg total) by mouth every 6 (six) hours as needed for nausea or vomiting. 08/30/24   Prosperi, Christian H, PA-C    Allergies: Patient has no known allergies.    Review of Systems  Gastrointestinal:  Positive for abdominal pain, nausea and vomiting.  All other systems reviewed and are negative.   Updated Vital Signs BP (!) 131/99   Pulse 98   Temp 98.3 F (36.8 C) (Oral)   Resp 20   SpO2 96%   Physical Exam Vitals and nursing note reviewed.  Constitutional:  General: He is not in acute distress.    Appearance: Normal appearance. He is normal weight. He is not ill-appearing, toxic-appearing or diaphoretic.  HENT:     Head: Normocephalic and atraumatic.  Cardiovascular:     Rate and Rhythm: Normal rate.  Pulmonary:     Effort: Pulmonary effort is normal. No respiratory distress.  Abdominal:     General: Abdomen is flat.     Palpations: Abdomen is soft.     Tenderness: There is abdominal tenderness in the left lower quadrant. There is no guarding or rebound.  Musculoskeletal:        General: Normal range of motion.      Cervical back: Normal range of motion.  Skin:    General: Skin is warm and dry.  Neurological:     General: No focal deficit present.     Mental Status: He is alert.  Psychiatric:        Mood and Affect: Mood normal.        Behavior: Behavior normal.     (all labs ordered are listed, but only abnormal results are displayed) Labs Reviewed  CBC - Abnormal; Notable for the following components:      Result Value   WBC 10.8 (*)    All other components within normal limits  I-STAT CHEM 8, ED - Abnormal; Notable for the following components:   Potassium 3.2 (*)    Glucose, Bld 135 (*)    Calcium, Ion 1.10 (*)    All other components within normal limits  LIPASE, BLOOD  COMPREHENSIVE METABOLIC PANEL WITH GFR  URINALYSIS, ROUTINE W REFLEX MICROSCOPIC    EKG: None  Radiology: CT ABDOMEN PELVIS W CONTRAST Result Date: 09/30/2024 EXAM: CT ABDOMEN AND PELVIS WITH CONTRAST 09/30/2024 01:29:50 PM TECHNIQUE: CT of the abdomen and pelvis was performed with the administration of 100 mL of iohexol  (OMNIPAQUE ) 300 MG/ML solution. Multiplanar reformatted images are provided for review. Automated exposure control, iterative reconstruction, and/or weight-based adjustment of the mA/kV was utilized to reduce the radiation dose to as low as reasonably achievable. COMPARISON: 08/30/2024 CLINICAL HISTORY: Abdominal pain, acute, nonlocalized. FINDINGS: LOWER CHEST: Small type 1 hiatal hernia. LIVER: The liver is unremarkable. GALLBLADDER AND BILE DUCTS: Gallbladder is unremarkable. No biliary ductal dilatation. SPLEEN: No acute abnormality. PANCREAS: No acute abnormality. ADRENAL GLANDS: No acute abnormality. KIDNEYS, URETERS AND BLADDER: No stones in the kidneys or ureters. No hydronephrosis. No perinephric or periureteral stranding. Urinary bladder is unremarkable. GI AND BOWEL: Stomach demonstrates no acute abnormality. Descending and sigmoid colon diverticulosis. Normal appendix. There is no bowel  obstruction. PERITONEUM AND RETROPERITONEUM: No ascites. No free air. VASCULATURE: Aorta is normal in caliber. LYMPH NODES: No lymphadenopathy. REPRODUCTIVE ORGANS: No acute abnormality. BONES AND SOFT TISSUES: Degenerative spurring of both acetabula. Mild degenerative subcortical cyst formation in the right femoral head not changed from previous. No acute osseous abnormality. No focal soft tissue abnormality. IMPRESSION: 1. No acute findings in the abdomen or pelvis. 2. Descending and sigmoid colon diverticulosis, without acute diverticulitis. 3. Small type 1 hiatal hernia. 4. Mild degenerative subcortical cyst formation in the right femoral head, unchanged. 5. Degenerative spurring of both acetabula. Electronically signed by: Ryan Salvage MD 09/30/2024 03:14 PM EST RP Workstation: [id number]     Procedures   Medications Ordered in the ED  ondansetron  (ZOFRAN ) injection 4 mg (4 mg Intravenous Given 09/30/24 1305)  morphine  (PF) 4 MG/ML injection 4 mg (4 mg Intravenous Given 09/30/24 1305)  sodium chloride  0.9 % bolus 1,000  mL (1,000 mLs Intravenous New Bag/Given 09/30/24 1307)  iohexol  (OMNIPAQUE ) 300 MG/ML solution 100 mL (100 mLs Intravenous Contrast Given 09/30/24 1323)                                    Medical Decision Making Amount and/or Complexity of Data Reviewed Labs: ordered. Radiology: ordered.  Risk Prescription drug management.   This patient is a 48 y.o. male who presents to the ED for concern of nausea, vomiting, abdominal pain, this involves an extensive number of treatment options, and is a complaint that carries with it a high risk of complications and morbidity. The emergent differential diagnosis prior to evaluation includes, but is not limited to,  AAA, gastroenteritis, appendicitis, Bowel obstruction, Bowel perforation. Gastroparesis, DKA, Hernia, Inflammatory bowel disease, mesenteric ischemia, pancreatitis, peritonitis SBP, volvulus.  This is not an  exhaustive differential.   Past Medical History / Co-morbidities / Social History:  has a past medical history of Gastritis, Hypertension, and Polyp of intestine.  Additional history: Chart reviewed. Pertinent results include: several previous visits for similar symptoms, has had diverticulitis previously, also symptoms attributed to cannabis use previously as well.  Physical Exam: Physical exam performed. The pertinent findings include: No vomiting noted on exam, left lower quadrant abdominal tenderness without rebound or guarding.  Lab Tests: I ordered, and personally interpreted labs.  The pertinent results include:  WBC 10.8, K 3.4, anion gap 16   Imaging Studies: I ordered imaging studies including Ct abdomen pelvis. I independently visualized and interpreted imaging which showed   1. No acute findings in the abdomen or pelvis. 2. Descending and sigmoid colon diverticulosis, without acute diverticulitis. 3. Small type 1 hiatal hernia. 4. Mild degenerative subcortical cyst formation in the right femoral head, unchanged. 5. Degenerative spurring of both acetabula.  I agree with the radiologist interpretation.   Medications: I ordered medication including IV fluids, oral potassium, zofran , morphine   for pain, nausea, hypokalemia. Reevaluation of the patient after these medicines showed that the patient resolved. I have reviewed the patients home medicines and have made adjustments as needed.  Disposition: After consideration of the diagnostic results and the patients response to treatment, I feel that emergency department workup does not suggest an emergent condition requiring admission or immediate intervention beyond what has been performed at this time. The plan is: Discharge with close outpatient follow-up and return precautions.  Patient's workup is benign and he is feeling better after above interventions.  He is able to tolerate p.o. without any residual nausea or vomiting.  He  already has Zofran  at home, will add Phenergan  suppositories as well instructions for use discussed. Evaluation and diagnostic testing in the emergency department does not suggest an emergent condition requiring admission or immediate intervention beyond what has been performed at this time.  Plan for discharge with close PCP follow-up.  Patient is understanding and amenable with plan, educated on red flag symptoms that would prompt immediate return.  Patient discharged in stable condition.  Final diagnoses:  Nausea and vomiting, unspecified vomiting type  Left lower quadrant abdominal pain    ED Discharge Orders          Ordered    promethazine  (PHENERGAN ) 25 MG suppository  Every 6 hours PRN        09/30/24 1524          An After Visit Summary was printed and given to the patient.  Nora Lauraine LABOR, PA-C 09/30/24 1525    Neysa Caron PARAS, DO 09/30/24 1558

## 2024-09-30 NOTE — ED Triage Notes (Signed)
 Pt arrives to triage with complaints of abdominal cramping and vomiting that began yesterday at approx 5AM. Pt reports a hx of gastritis, pt also states that he smokes marijuana daily.

## 2024-09-30 NOTE — Discharge Instructions (Signed)
 As we discussed, your workup in the ER today was reassuring for acute findings.  Laboratory evaluation and CT imaging did not reveal any emergent cause of your symptoms.  Given that you are feeling better after interventions today, no further evaluation is indicated.  I recommend that you follow-up closely with your PCP and your GI specialist.  In the future, I recommend that you limit your marijuana use as this is probably exacerbating your symptoms.  Return if development of any new or worsening symptoms.

## 2024-11-14 ENCOUNTER — Emergency Department (HOSPITAL_COMMUNITY): Admission: EM | Admit: 2024-11-14 | Discharge: 2024-11-14 | Disposition: A | Discharge status: Home / Self Care

## 2024-11-14 ENCOUNTER — Emergency Department (HOSPITAL_COMMUNITY)

## 2024-11-14 DIAGNOSIS — R1032 Left lower quadrant pain: Secondary | ICD-10-CM

## 2024-11-14 DIAGNOSIS — I10 Essential (primary) hypertension: Secondary | ICD-10-CM | POA: Diagnosis not present

## 2024-11-14 DIAGNOSIS — D72829 Elevated white blood cell count, unspecified: Secondary | ICD-10-CM | POA: Diagnosis not present

## 2024-11-14 DIAGNOSIS — Z79899 Other long term (current) drug therapy: Secondary | ICD-10-CM | POA: Diagnosis not present

## 2024-11-14 DIAGNOSIS — R112 Nausea with vomiting, unspecified: Secondary | ICD-10-CM | POA: Insufficient documentation

## 2024-11-14 DIAGNOSIS — R109 Unspecified abdominal pain: Secondary | ICD-10-CM | POA: Diagnosis present

## 2024-11-14 DIAGNOSIS — R1084 Generalized abdominal pain: Secondary | ICD-10-CM | POA: Insufficient documentation

## 2024-11-14 LAB — COMPREHENSIVE METABOLIC PANEL WITH GFR
ALT: 6 U/L (ref 0–44)
AST: 12 U/L — ABNORMAL LOW (ref 15–41)
Albumin: 4.8 g/dL (ref 3.5–5.0)
Alkaline Phosphatase: 82 U/L (ref 38–126)
Anion gap: 15 (ref 5–15)
BUN: 10 mg/dL (ref 6–20)
CO2: 27 mmol/L (ref 22–32)
Calcium: 9.9 mg/dL (ref 8.9–10.3)
Chloride: 98 mmol/L (ref 98–111)
Creatinine, Ser: 1.16 mg/dL (ref 0.61–1.24)
GFR, Estimated: >60 mL/min
Glucose, Bld: 136 mg/dL — ABNORMAL HIGH (ref 70–99)
Potassium: 3.5 mmol/L (ref 3.5–5.1)
Sodium: 139 mmol/L (ref 135–145)
Total Bilirubin: 0.8 mg/dL (ref 0.0–1.2)
Total Protein: 8 g/dL (ref 6.5–8.1)

## 2024-11-14 LAB — CBC
HCT: 44.9 % (ref 39.0–52.0)
Hemoglobin: 15.8 g/dL (ref 13.0–17.0)
MCH: 32 pg (ref 26.0–34.0)
MCHC: 35.2 g/dL (ref 30.0–36.0)
MCV: 91.1 fL (ref 80.0–100.0)
Platelets: 511 K/uL — ABNORMAL HIGH (ref 150–400)
RBC: 4.93 MIL/uL (ref 4.22–5.81)
RDW: 13.8 % (ref 11.5–15.5)
WBC: 12.2 K/uL — ABNORMAL HIGH (ref 4.0–10.5)
nRBC: 0 % (ref 0.0–0.2)

## 2024-11-14 LAB — LIPASE, BLOOD: Lipase: 30 U/L (ref 11–51)

## 2024-11-14 MED ORDER — ALUM & MAG HYDROXIDE-SIMETH 200-200-20 MG/5ML PO SUSP
30.0000 mL | Freq: Once | ORAL | Status: AC
  Administered 2024-11-14: 30 mL via ORAL
  Filled 2024-11-14: qty 30

## 2024-11-14 MED ORDER — SODIUM CHLORIDE 0.9 % IV BOLUS
1000.0000 mL | Freq: Once | INTRAVENOUS | Status: AC
  Administered 2024-11-14: 1000 mL via INTRAVENOUS

## 2024-11-14 MED ORDER — SODIUM CHLORIDE 0.9 % IV SOLN
12.5000 mg | Freq: Once | INTRAVENOUS | Status: AC
  Administered 2024-11-14: 12.5 mg via INTRAVENOUS
  Filled 2024-11-14: qty 12.5

## 2024-11-14 MED ORDER — MORPHINE SULFATE (PF) 4 MG/ML IV SOLN
4.0000 mg | Freq: Once | INTRAVENOUS | Status: AC
  Administered 2024-11-14: 4 mg via INTRAVENOUS
  Filled 2024-11-14: qty 1

## 2024-11-14 MED ORDER — LIDOCAINE VISCOUS HCL 2 % MT SOLN
15.0000 mL | Freq: Once | OROMUCOSAL | Status: AC
  Administered 2024-11-14: 15 mL via ORAL
  Filled 2024-11-14: qty 15

## 2024-11-14 MED ORDER — IOHEXOL 300 MG/ML  SOLN
100.0000 mL | Freq: Once | INTRAMUSCULAR | Status: AC | PRN
  Administered 2024-11-14: 100 mL via INTRAVENOUS

## 2024-11-14 MED ORDER — ONDANSETRON HCL 4 MG/2ML IJ SOLN
4.0000 mg | Freq: Once | INTRAMUSCULAR | Status: AC
  Administered 2024-11-14: 4 mg via INTRAVENOUS
  Filled 2024-11-14: qty 2

## 2024-11-14 MED ORDER — SODIUM CHLORIDE 0.9 % IV SOLN: 12.5000 mg | Freq: Four times a day (QID) | INTRAVENOUS | Status: DC | PRN

## 2024-11-14 NOTE — ED Triage Notes (Signed)
 Pt with abd pain & n/v x 3 days, hx of gastritis

## 2024-11-14 NOTE — ED Provider Notes (Signed)
" °  Physical Exam  BP (!) 155/111 (BP Location: Right Arm)   Pulse 75   Temp 98.1 F (36.7 C) (Oral)   Resp 16   SpO2 98%   Physical Exam Vitals and nursing note reviewed.  Constitutional:      Appearance: He is well-developed.  HENT:     Head: Normocephalic and atraumatic.  Cardiovascular:     Rate and Rhythm: Normal rate.  Abdominal:     General: Abdomen is flat. Bowel sounds are normal.     Palpations: Abdomen is soft.  Skin:    General: Skin is warm and dry.  Neurological:     Mental Status: He is alert and oriented to person, place, and time.     Procedures  Procedures  ED Course / MDM    Medical Decision Making Amount and/or Complexity of Data Reviewed Labs: ordered. Radiology: ordered.  Risk OTC drugs. Prescription drug management.   Patient care assumed from Jamie B. PA at shift change, please see her note for a full HPI. Briefly, and here with abdominal pain that is been ongoing for the last couple of days.  Some nausea and vomiting.  Does endorse daily marijuana intake.  Labs are benign aside from some slight leukocytosis likely reactive from his vomiting.  CT was negative.  Received morphine , Zofran , Phenergan  with resolution in symptoms.  Vitals are within normal limits, patient reassessed by me, this had improvement in his symptoms without any further episodes of vomiting.  He is hemodynamically stable for discharge.    Portions of this note were generated with Scientist, clinical (histocompatibility and immunogenetics). Dictation errors may occur despite best attempts at proofreading.         Ramona Slinger, PA-C 11/14/24 1732    Franklyn Sid SAILOR, MD 11/14/24 1929  "

## 2024-11-14 NOTE — ED Notes (Signed)
Pt reminded of need for urine 

## 2024-11-14 NOTE — Discharge Instructions (Signed)
 Your laboratories also within normal limits today.  The CT of your abdomen did not show any acute findings.  Please consider discontinuing daily use of marijuana as this could be contributing to your abdominal pain.

## 2024-11-14 NOTE — ED Provider Notes (Signed)
 "  EMERGENCY DEPARTMENT AT [facility] Provider Note   CSN: [id number] Arrival date & time: 11/14/24  1239     Patient presents with: Nausea, Emesis, and Abdominal Pain   Victor Johnson is a 49 y.o. male patient with past medical history of diverticulitis, alcohol abuse, intractable nausea vomiting, history of pancreatitis presents to the ED with complaint of generalized abdominal pain worse in the left lower quadrant associated with excessive nausea and vomiting. Admits to marijuana use.   No blood in his vomit.  Initially had some loose stools with this but it resolved.  Denies any recent antibiotic use or recent travel.  No excessive alcohol or NSAID use.    Emesis Associated symptoms: abdominal pain   Abdominal Pain Associated symptoms: vomiting        Prior to Admission medications  Medication Sig Start Date End Date Taking? Authorizing Provider  acetaminophen  (TYLENOL ) 500 MG tablet Take 1 tablet (500 mg total) by mouth every 6 (six) hours as needed. Patient taking differently: Take 1,000 mg by mouth daily as needed for mild pain (pain score 1-3) or moderate pain (pain score 4-6). 12/07/23   Remi Pippin, NP  allopurinol  (ZYLOPRIM ) 100 MG tablet Take 1 tablet (100 mg total) by mouth daily. 01/14/24   Celestia Rosaline SQUIBB, NP  amLODipine  (NORVASC ) 10 MG tablet Take 1 tablet (10 mg total) by mouth daily. 06/09/24   Newlin, Enobong, MD  amoxicillin -clavulanate (AUGMENTIN ) 875-125 MG tablet Take 1 tablet by mouth every 12 (twelve) hours. 05/07/24   Lemly, Tatum N, MD  cyclobenzaprine  (FLEXERIL ) 10 MG tablet TAKE 1 TABLET BY MOUTH TWICE DAILY AS NEEDED FOR MUSCLE SPASM Patient taking differently: Take 10-20 mg by mouth daily as needed for muscle spasms. 01/19/24   Celestia Rosaline SQUIBB, NP  dicyclomine  (BENTYL ) 20 MG tablet Take 1 tablet (20 mg total) by mouth 2 (two) times daily. 07/12/24   Jarold Olam HERO, PA-C  HYDROcodone -acetaminophen  (NORCO/VICODIN) 5-325 MG tablet  Take 1 tablet by mouth every 6 (six) hours as needed for severe pain (pain score 7-10). 06/07/24   Garrick Charleston, MD  metroNIDAZOLE  (FLAGYL ) 500 MG tablet Take 1 tablet (500 mg total) by mouth 2 (two) times daily. 06/07/24   Garrick Charleston, MD  ondansetron  (ZOFRAN ) 4 MG tablet Take 1 tablet (4 mg total) by mouth daily as needed for nausea or vomiting. 05/21/24   Celestia Rosaline SQUIBB, NP  ondansetron  (ZOFRAN -ODT) 4 MG disintegrating tablet Take 1 tablet (4 mg total) by mouth every 8 (eight) hours as needed. 07/12/24   Jarold Olam HERO, PA-C  pantoprazole  (PROTONIX ) 40 MG tablet Take 1 tablet (40 mg total) by mouth daily. 01/31/24 04/22/24  Drusilla Sabas RAMAN, MD  promethazine  (PHENERGAN ) 25 MG suppository Place 1 suppository (25 mg total) rectally every 6 (six) hours as needed for nausea or vomiting. 09/30/24   Smoot, Lauraine LABOR, PA-C    Allergies: Patient has no known allergies.    Review of Systems  Gastrointestinal:  Positive for abdominal pain and vomiting.    Updated Vital Signs BP (!) 134/115 (BP Location: Right Arm)   Pulse 95   Temp 98.5 F (36.9 C) (Oral)   Resp 17   SpO2 100%   Physical Exam Vitals and nursing note reviewed.  Constitutional:      General: He is not in acute distress.    Appearance: He is ill-appearing. He is not toxic-appearing.  HENT:     Head: Normocephalic and atraumatic.  Eyes:  General: No scleral icterus.    Conjunctiva/sclera: Conjunctivae normal.  Cardiovascular:     Rate and Rhythm: Normal rate and regular rhythm.     Pulses: Normal pulses.     Heart sounds: Normal heart sounds.  Pulmonary:     Effort: Pulmonary effort is normal. No respiratory distress.     Breath sounds: Normal breath sounds.  Abdominal:     General: Abdomen is flat. Bowel sounds are normal.     Palpations: Abdomen is soft.     Tenderness: There is abdominal tenderness in the left lower quadrant. There is no guarding or rebound.  Skin:    General: Skin is warm and dry.      Findings: No lesion.  Neurological:     General: No focal deficit present.     Mental Status: He is alert and oriented to person, place, and time. Mental status is at baseline.     (all labs ordered are listed, but only abnormal results are displayed) Labs Reviewed  COMPREHENSIVE METABOLIC PANEL WITH GFR - Abnormal; Notable for the following components:      Result Value   Glucose, Bld 136 (*)    AST 12 (*)    All other components within normal limits  CBC - Abnormal; Notable for the following components:   WBC 12.2 (*)    Platelets 511 (*)    All other components within normal limits  LIPASE, BLOOD  URINALYSIS, ROUTINE W REFLEX MICROSCOPIC    EKG: None  Radiology: No results found.   Procedures   Medications Ordered in the ED  promethazine  (PHENERGAN ) 12.5 mg in sodium chloride  0.9 % 50 mL IVPB (has no administration in time range)  alum & mag hydroxide-simeth (MAALOX/MYLANTA) 200-200-20 MG/5ML suspension 30 mL (has no administration in time range)    And  lidocaine  (XYLOCAINE ) 2 % viscous mouth solution 15 mL (has no administration in time range)  ondansetron  (ZOFRAN ) injection 4 mg (4 mg Intravenous Given 11/14/24 1359)  morphine  (PF) 4 MG/ML injection 4 mg (4 mg Intravenous Given 11/14/24 1359)  sodium chloride  0.9 % bolus 1,000 mL (1,000 mLs Intravenous New Bag/Given 11/14/24 1358)  iohexol  (OMNIPAQUE ) 300 MG/ML solution 100 mL (100 mLs Intravenous Contrast Given 11/14/24 1436)                                    Medical Decision Making Amount and/or Complexity of Data Reviewed Labs: ordered. Radiology: ordered.  Risk OTC drugs. Prescription drug management.   This patient presents to the ED for concern of abdominal pain, this involves an extensive number of treatment options, and is a complaint that carries with it a high risk of complications and morbidity.  The differential diagnosis includes cholecystitis, AAA, appendicitis, renal stone, UTI   Co  morbidities that complicate the patient evaluation  HTN, Hx of diverticulitis    Additional history obtained:  Additional history obtained from 09/30/24 with similar symptoms.  Patient has had 11 prior CT scans of abdomen and pelvis over the last 1 year   Lab Tests:  I personally interpreted labs.  The pertinent results include:   Patient has leukocytosis of 12.2, CMP and lipase are unremarkable   Imaging Studies ordered:  I ordered imaging studies including CT abd/pelvis   CT scan of abdomen pelvis shows no acute findings.  He does have diverticulosis without diverticulitis.   Cardiac Monitoring: / EKG:  The patient was  maintained on a cardiac monitor.     Problem List / ED Course / Critical interventions / Medication management  Patient presents to emergency room with 3 days of upper quadrant pain associate with nausea and vomiting.  He appears quite uncomfortable.  Has reproducible left lower quadrant tenderness.  Vitals are stable, afebrile.  Lab work is consistent with mild leukocytosis otherwise CMP and lipase are within normal limits.  Obtain CT scan of abdomen and pelvis to rule out acute abnormalities. I ordered medication including morphine , Zofran  and normal saline. Reevaluation of the patient after these medicines showed that the patient improved I have reviewed the patients home medicines and have made adjustments as needed. Mild improvement on reassessment.  Will give Phenergan  and then try GI cocktail 15-20 minutes later, no complaining over throat irritation from repetitive vomiting. Patient was signed out to oncoming ED provider pending assessment after finding urine and p.o. challenge.      Final diagnoses:  Nausea and vomiting, unspecified vomiting type  LLQ abdominal pain    ED Discharge Orders     None          Shermon Warren SAILOR, PA-C 11/14/24 1507    Neysa Caron PARAS, DO 11/14/24 1525  "

## 2024-11-26 ENCOUNTER — Other Ambulatory Visit: Payer: Self-pay | Admitting: Gastroenterology

## 2024-11-26 DIAGNOSIS — R112 Nausea with vomiting, unspecified: Secondary | ICD-10-CM

## 2024-12-10 ENCOUNTER — Encounter (HOSPITAL_COMMUNITY): Admission: RE | Admit: 2024-12-10

## 2024-12-10 DIAGNOSIS — R112 Nausea with vomiting, unspecified: Secondary | ICD-10-CM

## 2024-12-10 MED ORDER — TECHNETIUM TC 99M SULFUR COLLOID
2.0200 | Freq: Once | INTRAVENOUS | Status: AC | PRN
  Administered 2024-12-10: 2.02 via INTRAVENOUS
